# Patient Record
Sex: Female | Born: 1935 | Race: White | Hispanic: No | Smoking: Never smoker
Health system: Southern US, Community
[De-identification: ages and names within clinical notes are randomized; demographics above are authoritative.]

## PROBLEM LIST (undated history)

## (undated) DIAGNOSIS — M1990 Unspecified osteoarthritis, unspecified site: Secondary | ICD-10-CM

## (undated) DIAGNOSIS — K635 Polyp of colon: Secondary | ICD-10-CM

## (undated) DIAGNOSIS — I776 Arteritis, unspecified: Secondary | ICD-10-CM

## (undated) DIAGNOSIS — K649 Unspecified hemorrhoids: Secondary | ICD-10-CM

## (undated) DIAGNOSIS — K219 Gastro-esophageal reflux disease without esophagitis: Secondary | ICD-10-CM

## (undated) DIAGNOSIS — N189 Chronic kidney disease, unspecified: Secondary | ICD-10-CM

## (undated) DIAGNOSIS — I10 Essential (primary) hypertension: Secondary | ICD-10-CM

## (undated) DIAGNOSIS — C649 Malignant neoplasm of unspecified kidney, except renal pelvis: Secondary | ICD-10-CM

## (undated) DIAGNOSIS — N390 Urinary tract infection, site not specified: Secondary | ICD-10-CM

## (undated) DIAGNOSIS — E079 Disorder of thyroid, unspecified: Secondary | ICD-10-CM

## (undated) HISTORY — DX: Chronic kidney disease, unspecified: N18.9

## (undated) HISTORY — DX: Polyp of colon: K63.5

## (undated) HISTORY — DX: Unspecified osteoarthritis, unspecified site: M19.90

## (undated) HISTORY — DX: Gastro-esophageal reflux disease without esophagitis: K21.9

## (undated) HISTORY — PX: BILATERAL SALPINGOOPHORECTOMY: SHX1223

## (undated) HISTORY — DX: Disorder of thyroid, unspecified: E07.9

## (undated) HISTORY — DX: Unspecified hemorrhoids: K64.9

## (undated) HISTORY — DX: Arteritis, unspecified: I77.6

## (undated) HISTORY — DX: Essential (primary) hypertension: I10

## (undated) HISTORY — PX: ABDOMINAL HYSTERECTOMY: SHX81

## (undated) HISTORY — PX: HERNIA REPAIR: SHX51

## (undated) HISTORY — PX: COLONOSCOPY W/ BIOPSIES: SHX1374

## (undated) HISTORY — DX: Malignant neoplasm of unspecified kidney, except renal pelvis: C64.9

## (undated) HISTORY — DX: Urinary tract infection, site not specified: N39.0

## (undated) HISTORY — PX: FRACTURE SURGERY: SHX138

---
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
## 1998-08-04 HISTORY — PX: NOSE SURGERY: SHX723

## 2001-08-04 DIAGNOSIS — C649 Malignant neoplasm of unspecified kidney, except renal pelvis: Secondary | ICD-10-CM

## 2001-08-04 HISTORY — DX: Malignant neoplasm of unspecified kidney, except renal pelvis: C64.9

## 2004-05-21 ENCOUNTER — Ambulatory Visit: Payer: Self-pay

## 2004-05-30 ENCOUNTER — Ambulatory Visit: Payer: Self-pay | Admitting: General Surgery

## 2004-05-30 HISTORY — PX: UPPER GI ENDOSCOPY: SHX6162

## 2004-08-04 HISTORY — PX: INCONTINENCE SURGERY: SHX676

## 2007-02-02 ENCOUNTER — Ambulatory Visit: Payer: Self-pay | Admitting: Internal Medicine

## 2007-02-03 ENCOUNTER — Ambulatory Visit: Payer: Self-pay | Admitting: Internal Medicine

## 2008-06-03 ENCOUNTER — Emergency Department: Payer: Self-pay | Admitting: Emergency Medicine

## 2009-06-21 ENCOUNTER — Ambulatory Visit: Payer: Self-pay | Admitting: Family Medicine

## 2010-09-01 ENCOUNTER — Emergency Department: Payer: Self-pay | Admitting: Emergency Medicine

## 2011-04-02 ENCOUNTER — Ambulatory Visit: Payer: Self-pay | Admitting: Internal Medicine

## 2011-04-12 ENCOUNTER — Emergency Department: Payer: Self-pay | Admitting: *Deleted

## 2011-05-27 ENCOUNTER — Ambulatory Visit: Payer: Self-pay | Admitting: General Surgery

## 2011-05-30 LAB — PATHOLOGY REPORT

## 2011-06-16 ENCOUNTER — Ambulatory Visit: Payer: Self-pay | Admitting: Internal Medicine

## 2011-10-04 ENCOUNTER — Emergency Department: Payer: Self-pay | Admitting: Emergency Medicine

## 2011-10-04 LAB — BASIC METABOLIC PANEL
Anion Gap: 16 (ref 7–16)
BUN: 25 mg/dL — ABNORMAL HIGH (ref 7–18)
Calcium, Total: 8.8 mg/dL (ref 8.5–10.1)
Chloride: 107 mmol/L (ref 98–107)
Creatinine: 1.36 mg/dL — ABNORMAL HIGH (ref 0.60–1.30)
EGFR (African American): 48 — ABNORMAL LOW
EGFR (Non-African Amer.): 40 — ABNORMAL LOW
Glucose: 115 mg/dL — ABNORMAL HIGH (ref 65–99)
Osmolality: 296 (ref 275–301)
Potassium: 4.4 mmol/L (ref 3.5–5.1)
Sodium: 146 mmol/L — ABNORMAL HIGH (ref 136–145)

## 2011-10-04 LAB — CBC WITH DIFFERENTIAL/PLATELET
Basophil #: 0 10*3/uL (ref 0.0–0.1)
Eosinophil #: 0 10*3/uL (ref 0.0–0.7)
HCT: 37.8 % (ref 35.0–47.0)
HGB: 12.4 g/dL (ref 12.0–16.0)
Lymphocyte #: 1 10*3/uL (ref 1.0–3.6)
Lymphocyte %: 14.6 %
MCHC: 32.9 g/dL (ref 32.0–36.0)
Monocyte %: 6 %
Neutrophil %: 79.3 %
RDW: 14.3 % (ref 11.5–14.5)
WBC: 6.5 10*3/uL (ref 3.6–11.0)

## 2011-10-04 LAB — TROPONIN I: Troponin-I: <0.02 ng/mL

## 2012-01-20 ENCOUNTER — Ambulatory Visit: Payer: Self-pay | Admitting: Physical Medicine and Rehabilitation

## 2012-04-03 ENCOUNTER — Inpatient Hospital Stay: Payer: Self-pay | Admitting: Specialist

## 2012-04-03 LAB — CBC
HCT: 30 % — ABNORMAL LOW (ref 35.0–47.0)
MCH: 31.8 pg (ref 26.0–34.0)
MCHC: 34.1 g/dL (ref 32.0–36.0)
RDW: 13.8 % (ref 11.5–14.5)

## 2012-04-03 LAB — COMPREHENSIVE METABOLIC PANEL
Albumin: 3 g/dL — ABNORMAL LOW (ref 3.4–5.0)
Anion Gap: 9 (ref 7–16)
BUN: 29 mg/dL — ABNORMAL HIGH (ref 7–18)
Co2: 25 mmol/L (ref 21–32)
Creatinine: 1.76 mg/dL — ABNORMAL HIGH (ref 0.60–1.30)
Glucose: 121 mg/dL — ABNORMAL HIGH (ref 65–99)
Osmolality: 286 (ref 275–301)
Potassium: 3.8 mmol/L (ref 3.5–5.1)
Sodium: 140 mmol/L (ref 136–145)
Total Protein: 6.2 g/dL — ABNORMAL LOW (ref 6.4–8.2)

## 2012-04-03 LAB — CK TOTAL AND CKMB (NOT AT ARMC): CK-MB: <0.5 ng/mL — ABNORMAL LOW (ref 0.5–3.6)

## 2012-04-03 LAB — URINALYSIS, COMPLETE
Bilirubin,UR: NEGATIVE
Blood: NEGATIVE
Specific Gravity: 1.011 (ref 1.003–1.030)
Squamous Epithelial: NONE SEEN

## 2012-04-03 LAB — TROPONIN I: Troponin-I: <0.02 ng/mL

## 2012-04-04 LAB — CBC WITH DIFFERENTIAL/PLATELET
Basophil #: 0 10*3/uL (ref 0.0–0.1)
Eosinophil %: 0 %
Lymphocyte #: 0.2 10*3/uL — ABNORMAL LOW (ref 1.0–3.6)
MCHC: 33.8 g/dL (ref 32.0–36.0)
MCV: 93 fL (ref 80–100)
Monocyte %: 4.8 %
Neutrophil %: 92.4 %
Platelet: 134 10*3/uL — ABNORMAL LOW (ref 150–440)
RDW: 13.8 % (ref 11.5–14.5)

## 2012-04-04 LAB — BASIC METABOLIC PANEL
Anion Gap: 10 (ref 7–16)
BUN: 24 mg/dL — ABNORMAL HIGH (ref 7–18)
Co2: 23 mmol/L (ref 21–32)
Creatinine: 1.36 mg/dL — ABNORMAL HIGH (ref 0.60–1.30)
EGFR (African American): 42 — ABNORMAL LOW
EGFR (Non-African Amer.): 36 — ABNORMAL LOW
Osmolality: 280 (ref 275–301)
Potassium: 4.3 mmol/L (ref 3.5–5.1)

## 2012-04-05 LAB — BASIC METABOLIC PANEL
Anion Gap: 4 — ABNORMAL LOW (ref 7–16)
BUN: 23 mg/dL — ABNORMAL HIGH (ref 7–18)
Calcium, Total: 8 mg/dL — ABNORMAL LOW (ref 8.5–10.1)
EGFR (African American): 45 — ABNORMAL LOW
EGFR (Non-African Amer.): 39 — ABNORMAL LOW
Glucose: 86 mg/dL (ref 65–99)
Osmolality: 282 (ref 275–301)
Potassium: 4.1 mmol/L (ref 3.5–5.1)

## 2012-04-08 ENCOUNTER — Ambulatory Visit: Payer: Self-pay | Admitting: Internal Medicine

## 2012-04-09 ENCOUNTER — Encounter: Payer: Self-pay | Admitting: Internal Medicine

## 2012-05-04 ENCOUNTER — Encounter: Payer: Self-pay | Admitting: Internal Medicine

## 2012-10-11 ENCOUNTER — Ambulatory Visit: Payer: Self-pay | Admitting: Internal Medicine

## 2013-08-09 ENCOUNTER — Ambulatory Visit (INDEPENDENT_AMBULATORY_CARE_PROVIDER_SITE_OTHER): Payer: Medicare Other | Admitting: Adult Health

## 2013-08-09 ENCOUNTER — Encounter: Payer: Self-pay | Admitting: Adult Health

## 2013-08-09 VITALS — BP 122/74 | HR 73 | Temp 97.8°F | Resp 12 | Ht 62.5 in | Wt 128.8 lb

## 2013-08-09 DIAGNOSIS — Z7189 Other specified counseling: Secondary | ICD-10-CM

## 2013-08-09 DIAGNOSIS — Z7689 Persons encountering health services in other specified circumstances: Secondary | ICD-10-CM

## 2013-08-09 DIAGNOSIS — E875 Hyperkalemia: Secondary | ICD-10-CM | POA: Insufficient documentation

## 2013-08-09 DIAGNOSIS — N189 Chronic kidney disease, unspecified: Secondary | ICD-10-CM | POA: Insufficient documentation

## 2013-08-09 NOTE — Assessment & Plan Note (Signed)
Patient reports taking potassium supplements twice a day secondary to hypokalemia. Noticed elevated potassium level on lab work drawn in November 2014. 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 Asked patient to hold potassium supplement until we can confirm that her levels are not elevated. Patient with chronic kidney disease.

## 2013-08-09 NOTE — Patient Instructions (Signed)
   Thank you for choosing [place] [facility] for your health care needs.  Please have your labs faxed to my office when you have them done at [facility].  Your potassium level was slightly elevated on the labs you had drawn in November.  Hold your potassium supplements until you have this checked.  Please feel free to call with any questions or concerns.  Below is information on how to activate your MyChart account.

## 2013-08-09 NOTE — Progress Notes (Signed)
Pre visit review using our clinic review tool, if applicable. No additional management support is needed unless otherwise documented below in the visit note. 

## 2013-08-09 NOTE — Progress Notes (Signed)
Subjective:    Patient ID: [name], female    DOB: June 04, 1929, 80 y.o.   MRN: [id number]  HPI  Pt is a pleasant 80 y/o female who presents to establish care. She is followed at [facility] for CKD and vasculitis. Pt was recently followed by Dr. Arline Asp. We will request records.  Reports that she has a history of low potassium. Noticed that labs drawn at [facility] in November show slightly elevated potassium. She is on K supplements bid. She will be having blood work done at [facility]. Pt does not recall when she last had a Medicare Wellness exam. She will schedule this at her earliest convenience.    Past Medical History  Diagnosis Date  . Arthritis   . Renal cell carcinoma 2003    s/p sgy for removal   . GERD (gastroesophageal reflux disease)   . Hypertension   . Chronic kidney disease   . Colon polyp   . UTI (lower urinary tract infection)   . Thyroid disease     hyperthyroid, had radioactiveiodine treatment   . Vasculitis      Past Surgical History  Procedure Laterality Date  . Cholecystectomy    . Abdominal hysterectomy    . Bilateral salpingoophorectomy    . Hernia repair    . Fracture surgery Right     femur fracture     Family History  Problem Relation Age of Onset  . Arthritis Mother   . Heart disease Mother   . Heart disease Father   . Heart disease Sister     CAD  . Hypertension Sister      History   Social History  . Marital Status: Widowed    Spouse Name: N/A    Number of Children: 4  . Years of Education: 11   Occupational History  . Administrative - Office Work     Retired. Supervisor for [employer] History Main Topics  . Smoking status: Never Smoker   . Smokeless tobacco: Never Used  . Alcohol Use: No  . Drug Use: No  . Sexual Activity: Not on file   Other Topics Concern  . Not on file   Social History Narrative   [name] grew up in [place], [place]. She widowed in 1995. She has 4 adult children (3 daughters and 1 son).  She is very active in her church. She also does work for the [employer].       Review of Systems  Constitutional: Positive for fatigue.       S/p rituxan at [place]: Negative.   Eyes: Negative.   Respiratory: Negative.   Cardiovascular: Positive for leg swelling.  Gastrointestinal: Negative.   Endocrine: Negative.   Genitourinary: Negative.   Musculoskeletal: Negative.   Skin: Negative.   Allergic/Immunologic: Negative.   Neurological: Negative.   Hematological: Negative.   Psychiatric/Behavioral: Negative.        Objective:   Physical Exam  Constitutional: She is oriented to person, place, and time. She appears well-developed and well-nourished. No distress.  HENT:  Head: Normocephalic and atraumatic.  Eyes: Conjunctivae and EOM are normal. Pupils are equal, round, and reactive to light.  Neck: Normal range of motion. Neck supple. No tracheal deviation present.  Cardiovascular: Normal rate, regular rhythm, normal heart sounds and intact distal pulses.  Exam reveals no gallop and no friction rub.   No murmur heard. Pulmonary/Chest: Effort normal and breath sounds normal. No respiratory distress. She has no  wheezes. She has no rales.  Abdominal: Soft. Bowel sounds are normal.  Musculoskeletal: Normal range of motion. She exhibits edema. She exhibits no tenderness.  Bilateral LE trace edema  Lymphadenopathy:    She has no cervical adenopathy.  Neurological: She is alert and oriented to person, place, and time. She has normal reflexes. Coordination normal.  Skin: Skin is warm and dry.  Psychiatric: She has a normal mood and affect. Her behavior is normal. Judgment and thought content normal.    BP 122/74  Pulse 73  Temp(Src) 97.8 F (36.6 C) (Oral)  Resp 12  Ht 5' 2.5" (1.588 m)  Wt 128 lb 12 oz (58.401 kg)  BMI 23.16 kg/m2  SpO2 97%       Assessment & Plan:

## 2013-08-09 NOTE — Assessment & Plan Note (Signed)
Patient is followed at [facility].

## 2013-08-09 NOTE — Assessment & Plan Note (Signed)
Visit to establish care. History and physical reviewed with patient as well as current medications. She does not recall when her last Medicare wellness exam was. She will schedule this at her earliest convenience.

## 2013-09-01 ENCOUNTER — Encounter: Payer: Self-pay | Admitting: Adult Health

## 2013-09-01 ENCOUNTER — Ambulatory Visit (INDEPENDENT_AMBULATORY_CARE_PROVIDER_SITE_OTHER): Payer: Medicare Other | Admitting: Adult Health

## 2013-09-01 VITALS — BP 128/66 | HR 62 | Temp 98.1°F | Resp 12 | Ht 62.5 in | Wt 129.0 lb

## 2013-09-01 DIAGNOSIS — R0989 Other specified symptoms and signs involving the circulatory and respiratory systems: Secondary | ICD-10-CM

## 2013-09-01 DIAGNOSIS — Z0000 Encounter for general adult medical examination without abnormal findings: Secondary | ICD-10-CM | POA: Insufficient documentation

## 2013-09-01 DIAGNOSIS — Z1239 Encounter for other screening for malignant neoplasm of breast: Secondary | ICD-10-CM | POA: Insufficient documentation

## 2013-09-01 NOTE — Assessment & Plan Note (Signed)
Absent pedal pulse on the right encountered during physical exam. Coolness of right foot. No pain reported. Some discoloration of LE noted. Refer to Vascular Surgery for evaluation and further recommendations.

## 2013-09-01 NOTE — Assessment & Plan Note (Signed)
Mammogram ordered. Pt will self schedule.

## 2013-09-01 NOTE — Progress Notes (Signed)
Pre visit review using our clinic review tool, if applicable. No additional management support is needed unless otherwise documented below in the visit note. 

## 2013-09-01 NOTE — Patient Instructions (Addendum)
  Today we did your Medicare Wellness Exam.  I am referring you to a Vascular Surgeon to evaluate your pulse on the right foot. I could not feel one.  I have given you an order for your yearly mammogram. Please call the [facility] and schedule your appointment with them. Let them know that you have the doctor's order.  I would like to see you for follow up after you have seen the Vascular Surgeon. Please schedule that appointment accordingly.  Please call with any questions or concerns.

## 2013-09-01 NOTE — Assessment & Plan Note (Signed)
Exam was normal except for inability to palpate right pedal pulse and coolness of right foot. Breast exam also done and normal. She will have a mammogram done. Defer pelvic exam given age and no symptoms reported. Refer to Vascular Surgery for evaluation of right foot.

## 2013-09-01 NOTE — Progress Notes (Signed)
Subjective:    Patient ID: [name], female    DOB: 03-24-1929, 80 y.o.   MRN: [id number]  HPI  The patient is here for annual Medicare wellness examination.   The risk factors are reflected in the social history.  The roster of all physicians providing medical care to patient is listed in the Snapshot section of the chart.  Activities of daily living:  The patient is 100% independent in all ADLs: dressing, toileting, bathing, feeding as well as independent mobility.  Instrumental Activities of daily living: The patient is 100% independent in all iADLs: cooking, driving, keeping track of finances, managing medications, shopping, using telephone and computer.  Home safety: The patient has smoke detectors in the home. Seatbelts are worn 100%.  There are no firearms at home. There is no violence in the home. No hx of IPV.  There is no risks for hepatitis, STDs or HIV. There is no history of blood transfusion. No travel history to infectious disease endemic areas of the world.  The patient has recently seen dentist in the last six month for new dentures. Pt has seen eye doctor in the last week. No hearing impairment. They have deferred audiologic testing in the last year.  No excessive sun exposure. Discussed the need for sun protection: hats, long sleeves and use of sunscreen if there is significant sun exposure.   Diet: the importance of a healthy diet is discussed. Pt follows a healthy diet.  The benefits of regular aerobic exercise were discussed.   Depression screen: there are no signs or vegative symptoms of depression- irritability, change in appetite, anhedonia, sadness/tearfullness.  Cognitive assessment: the patient manages all their financial and personal affairs and is actively engaged. Able to relate day,date,year and events; recalled 2/3 objects at 3 minutes; performed clock-face test normally.  The following portions of the patient's history were reviewed and  updated as appropriate: allergies, current medications, past family history, past medical history,  past surgical history, past social history  and problem list.  Visual acuity was not assessed per patient preference since has regular follow up with ophthalmologist. Hearing and body mass index were assessed and reviewed.   During the course of the visit the patient was educated and counseled about appropriate screening and preventive services including : fall prevention , diabetes screening, nutrition counseling, colorectal cancer screening, and recommended immunizations.      Past Medical History  Diagnosis Date  . Arthritis   . GERD (gastroesophageal reflux disease)   . Hypertension   . Chronic kidney disease   . Colon polyp   . UTI (lower urinary tract infection)   . Vasculitis   . Thyroid disease 1957    hyperthyroid, had radioactiveiodine treatment   . Renal cell carcinoma 2003    s/p sgy for removal right kidney     Past Surgical History  Procedure Laterality Date  . Abdominal hysterectomy    . Bilateral salpingoophorectomy    . Hernia repair    . Fracture surgery Right     femur fracture  . Cholecystectomy  1957  . Nose surgery  2000  . Incontinence surgery  2006  . Cardiac catheterization  2008     Family History  Problem Relation Age of Onset  . Arthritis Mother   . Heart disease Mother   . Heart disease Father   . Heart disease Sister     CAD  . Hypertension Sister      History   Social History  .  Marital Status: Widowed    Spouse Name: N/A    Number of Children: 4  . Years of Education: 11   Occupational History  . 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 Smoking status: Never Smoker   . Smokeless tobacco: Never Used  . Alcohol Use: No  . Drug Use: No  . Sexual Activity: Not on file   Other Topics Concern  . Not on file   Social History Narrative   Ms. Seabury grew up in  [place], [place]. She widowed in 1995. She has 4 adult children (3 daughters and 1 son). She is very active in her church. She also does work for the [employer].      Review of Systems  Constitutional: Negative.   HENT: Negative.   Eyes: Negative.   Respiratory: Negative.   Cardiovascular: Positive for leg swelling.       Swelling improved since using compression stockings.  Gastrointestinal: Negative.   Endocrine: Negative.   Genitourinary: Negative.   Musculoskeletal: Negative.   Skin: Negative.   Allergic/Immunologic: Negative.   Neurological: Negative.   Hematological: Negative.   Psychiatric/Behavioral: Negative.        Objective:   Physical Exam  Constitutional: She is oriented to person, place, and time. She appears well-developed and well-nourished. No distress.  HENT:  Head: Normocephalic and atraumatic.  Right Ear: External ear normal.  Left Ear: External ear normal.  Nose: Nose normal.  Mouth/Throat: Oropharynx is clear and moist.  Eyes: Conjunctivae and EOM are normal. Pupils are equal, round, and reactive to light.  Neck: Normal range of motion. Neck supple. No tracheal deviation present. No thyromegaly present.  Cardiovascular: Normal rate, regular rhythm and normal heart sounds.  Exam reveals no gallop and no friction rub.   No murmur heard. Right pedal pulse not palpable. Cool foot  Pulmonary/Chest: Effort normal and breath sounds normal. No respiratory distress. She has no wheezes. She has no rales. Right breast exhibits no inverted nipple, no mass, no nipple discharge, no skin change and no tenderness. Left breast exhibits no inverted nipple, no mass, no nipple discharge and no skin change. Breasts are symmetrical.  Abdominal: Soft. Bowel sounds are normal. She exhibits no distension and no mass. There is no tenderness. There is no rebound and no guarding.  Musculoskeletal: Normal range of motion. She exhibits no edema and no tenderness.  kyphosis    Lymphadenopathy:    She has no cervical adenopathy.  Neurological: She is alert and oriented to person, place, and time. She has normal reflexes. No cranial nerve deficit. Coordination normal.  Skin: Skin is warm and dry.     Psychiatric: She has a normal mood and affect. Her behavior is normal. Judgment and thought content normal.    BP 128/66  Pulse 62  Temp(Src) 98.1 F (36.7 C) (Oral)  Resp 12  Ht 5' 2.5" (1.588 m)  Wt 129 lb (58.514 kg)  BMI 23.20 kg/m2  SpO2 94%     Assessment & Plan:

## 2013-09-09 ENCOUNTER — Encounter (INDEPENDENT_AMBULATORY_CARE_PROVIDER_SITE_OTHER): Payer: Self-pay

## 2013-09-09 ENCOUNTER — Ambulatory Visit (INDEPENDENT_AMBULATORY_CARE_PROVIDER_SITE_OTHER): Payer: Medicare Other | Admitting: Adult Health

## 2013-09-09 ENCOUNTER — Encounter: Payer: Self-pay | Admitting: Adult Health

## 2013-09-09 VITALS — BP 122/68 | HR 60 | Resp 12 | Wt 129.0 lb

## 2013-09-09 DIAGNOSIS — R0989 Other specified symptoms and signs involving the circulatory and respiratory systems: Secondary | ICD-10-CM

## 2013-09-09 NOTE — Progress Notes (Signed)
Pre visit review using our clinic review tool, if applicable. No additional management support is needed unless otherwise documented below in the visit note. 

## 2013-09-09 NOTE — Progress Notes (Signed)
Patient ID: [name], female   DOB: 18-Sep-1928, 80 y.o.   MRN: [id number]    Subjective:    Patient ID: [name], female    DOB: 11-Jul-1929, 80 y.o.   MRN: [id number]  HPI  [name] is a pleasant 80 y/o female who presents to clinic for f/u after seeing vascular surgeon for absent pedal pulses during her Medicare Wellness Exam. She was also experiencing some bilateral LE swelling and coolness of feet. She reports that exam was normal. He did find her pulses and she reports he told her they were 95%. She is wearing her compression socks that at mid leg. Swelling has improved since wearing these. She looks good. Denies any new concerns.   Past Medical History  Diagnosis Date  . Arthritis   . GERD (gastroesophageal reflux disease)   . Hypertension   . Chronic kidney disease   . Colon polyp   . UTI (lower urinary tract infection)   . Vasculitis   . Thyroid disease 1957    hyperthyroid, had radioactiveiodine treatment   . Renal cell carcinoma 2003    s/p sgy for removal right kidney    Current Outpatient Prescriptions on File Prior to Visit  Medication Sig Dispense Refill  . aspirin 81 MG tablet Take 81 mg by mouth daily.      . cholecalciferol (VITAMIN D) 1000 UNITS tablet Take 1,000 Units by mouth daily.      Marland Kitchen CRANBERRY PO Take 84 mg by mouth daily.      . cyanocobalamin 500 MCG tablet Take 500 mcg by mouth daily.      . ferrous sulfate 325 (65 FE) MG tablet Take by mouth daily with breakfast. 1-2 tabs daily      . lisinopril-hydrochlorothiazide (PRINZIDE,ZESTORETIC) 10-12.5 MG per tablet Take 1 tablet by mouth daily.      . Multiple Vitamins-Minerals (CENTRUM SILVER PO) Take by mouth.      . Multiple Vitamins-Minerals (IMMUNE SYSTEM BOOSTER PO) Take by mouth. Ambatrose (Immune System) 3 tabs daily      . Multiple Vitamins-Minerals (PRESERVISION AREDS PO) Take 2 tablets by mouth.      . Omega-3 Fatty Acids (FISH OIL) 600 MG CAPS Take 1 capsule by mouth daily.      Marland Kitchen  OVER THE COUNTER MEDICATION Take 3 tablets by mouth daily. Real coral calcium      . predniSONE (DELTASONE) 5 MG tablet Take 5 mg by mouth daily.      . Probiotic Product (PROBIOTIC DAILY PO) Take 1 tablet by mouth daily.      . Turmeric 500 MG CAPS Take 2 capsules by mouth daily.      . vitamin C (ASCORBIC ACID) 500 MG tablet Take 500 mg by mouth daily.      . vitamin E 400 UNIT capsule Take 400 Units by mouth daily.       No current facility-administered medications on file prior to visit.   Review of Systems  Respiratory: Negative.   Cardiovascular: Negative.  Negative for leg swelling.  Genitourinary: Negative.   Neurological: Negative.   All other systems reviewed and are negative.       Objective:  BP 122/68  Pulse 60  Resp 12  Wt 129 lb (58.514 kg)  SpO2 98%   Physical Exam  Constitutional: She is oriented to person, place, and time. She appears well-developed and well-nourished. No distress.  Cardiovascular: Normal rate, regular rhythm, normal heart sounds and intact distal pulses.  Exam reveals no gallop.   No murmur heard. Pulmonary/Chest: Effort normal. No respiratory distress.  Neurological: She is alert and oriented to person, place, and time.  Skin: Skin is warm and dry.  Psychiatric: She has a normal mood and affect. Her behavior is normal. Judgment and thought content normal.       Assessment & Plan:   1. Absent pedal pulses Pt was seen by vascular on Monday. Per patient report pulses are present and no problems identified. I am requesting the report and will append this note accordingly. Swelling improved with compression socks. I have advised her to try to find some that are knee hi.

## 2013-09-22 ENCOUNTER — Encounter: Payer: Self-pay | Admitting: Adult Health

## 2013-10-12 ENCOUNTER — Ambulatory Visit: Payer: Self-pay | Admitting: Adult Health

## 2013-11-11 ENCOUNTER — Encounter: Payer: Self-pay | Admitting: Adult Health

## 2013-11-30 ENCOUNTER — Telehealth: Payer: Self-pay | Admitting: *Deleted

## 2013-11-30 NOTE — Telephone Encounter (Signed)
Please call pt and tell her that it was probably some residue from the pill or capsule. This can happen if there is any coating on the pills or capsule.

## 2013-11-30 NOTE — Telephone Encounter (Signed)
Spoke with patient and notified her of Raquel's comments. Patient verbalized understanding.  

## 2013-11-30 NOTE — Telephone Encounter (Signed)
Patient walked in stating some white smoke came out of her mouth. She went to the [employer] and bought some hair, skin and nail vitamins by the name of Biosil. They are white capsules, she has taken only 1 pill. She took it yesterday then a few minutes later a long chain of white smoke came out of her mouth. She is concerned about this because she has never smoked a day in her life, this is the only time it happened and has not happened again. Please advice.

## 2013-12-19 ENCOUNTER — Telehealth: Payer: Self-pay | Admitting: Adult Health

## 2013-12-19 NOTE — Telephone Encounter (Signed)
Pt left vm asking R. Rey to return her call.  No further details left on vm.dms

## 2013-12-20 ENCOUNTER — Other Ambulatory Visit: Payer: Self-pay | Admitting: Adult Health

## 2013-12-20 ENCOUNTER — Telehealth: Payer: Self-pay | Admitting: Adult Health

## 2013-12-20 DIAGNOSIS — K921 Melena: Secondary | ICD-10-CM

## 2013-12-20 NOTE — Telephone Encounter (Signed)
Patient stopped by the office to speak with Raquel. Patient request for Raquel to call to Dr. Festus Aloe office to make a referral for a colonoscopy. Patient stated that she is having black stools.

## 2013-12-20 NOTE — Telephone Encounter (Signed)
Please call pt and let her know that I will be glad to place the referral. I would like her to come by the office so that we can give her a collection container to evaluate for blood in her stool first.

## 2013-12-20 NOTE — Telephone Encounter (Signed)
Patient Stated that she has black stools and would like a referral to see Dr.Byrnette to have a colonoscopy done. Please advise.

## 2013-12-21 NOTE — Telephone Encounter (Signed)
Pt notified and verbalized understanding.

## 2013-12-23 ENCOUNTER — Other Ambulatory Visit (INDEPENDENT_AMBULATORY_CARE_PROVIDER_SITE_OTHER): Payer: Medicare Other

## 2013-12-23 DIAGNOSIS — R195 Other fecal abnormalities: Secondary | ICD-10-CM

## 2013-12-23 DIAGNOSIS — K921 Melena: Secondary | ICD-10-CM

## 2013-12-23 LAB — FECAL OCCULT BLOOD, IMMUNOCHEMICAL: FECAL OCCULT BLD: POSITIVE — AB

## 2013-12-26 ENCOUNTER — Other Ambulatory Visit: Payer: Self-pay | Admitting: Adult Health

## 2013-12-26 DIAGNOSIS — R195 Other fecal abnormalities: Secondary | ICD-10-CM

## 2013-12-28 ENCOUNTER — Encounter: Payer: Self-pay | Admitting: *Deleted

## 2013-12-30 ENCOUNTER — Encounter: Payer: Self-pay | Admitting: General Surgery

## 2014-01-03 ENCOUNTER — Ambulatory Visit (INDEPENDENT_AMBULATORY_CARE_PROVIDER_SITE_OTHER): Payer: Medicare Other | Admitting: General Surgery

## 2014-01-03 ENCOUNTER — Encounter: Payer: Self-pay | Admitting: General Surgery

## 2014-01-03 ENCOUNTER — Other Ambulatory Visit: Payer: Self-pay | Admitting: General Surgery

## 2014-01-03 VITALS — BP 98/60 | HR 74 | Resp 12 | Ht 60.0 in | Wt 131.0 lb

## 2014-01-03 DIAGNOSIS — Z860100 Personal history of colon polyps, unspecified: Secondary | ICD-10-CM | POA: Insufficient documentation

## 2014-01-03 DIAGNOSIS — K625 Hemorrhage of anus and rectum: Secondary | ICD-10-CM | POA: Insufficient documentation

## 2014-01-03 LAB — HEMOCCULT GUIAC POC 1CARD (OFFICE): Fecal Occult Blood, POC: NEGATIVE

## 2014-01-03 MED ORDER — POLYETHYLENE GLYCOL 3350 17 GM/SCOOP PO POWD
1.0000 | Freq: Once | ORAL | Status: DC
Start: 2014-01-03 — End: 2015-01-24

## 2014-01-03 NOTE — Progress Notes (Signed)
Patient ID: [name], female   DOB: [birth year], 80 y.o.   MRN: [id number]  Chief Complaint  Patient presents with  . Other    blood in stool    HPI [name] is a 80 y.o. female who presents for an evaluation of rectal bleeding. The patient states she has had rectal bleeding for approximately 2 years. She has a history of hemorrhoids. In the last couple of months she has had a significant amount of rectal bleeding. It is noted in the toilet bowl. The bleeding is bright red in color. Her last colonoscopy was 05/27/2011. She states he bowel movements have gotten black in color which started approximately 1 month ago when her iron supplements were increased from 2 times per day to 3 times per day. She also complains of constipation that has gotten worse in the last year.    HPI  Past Medical History  Diagnosis Date  . Arthritis   . GERD (gastroesophageal reflux disease)   . Hypertension   . Chronic kidney disease   . Colon polyp   . UTI (lower urinary tract infection)   . Vasculitis     ANCA positive, pulmonary hemorrhage  . Thyroid disease 1957    hyperthyroid, had radioactiveiodine treatment   . Renal cell carcinoma 2003    s/p sgy for removal right kidney    Past Surgical History  Procedure Laterality Date  . Abdominal hysterectomy    . Bilateral salpingoophorectomy    . Hernia repair    . Fracture surgery Right     femur fracture  . Cholecystectomy  1957  . Nose surgery  2000  . Incontinence surgery  2006  . Cardiac catheterization  2008  . Colonoscopy w/ biopsies  May 27, 2011    tubular adenoma in the ascending colon and descending colon. Tubulovillous adenoma in the upper rectum 12 mm. Diverticulosis.  Marland Kitchen Upper gi endoscopy  May 30, 2004    large hiatal hernia, duodenal diverticulum.    Family History  Problem Relation Age of Onset  . Arthritis Mother   . Heart disease Mother   . Heart disease Father   . Heart disease Sister     CAD  .  Hypertension Sister     Social History History  Substance Use Topics  . Smoking status: Never Smoker   . Smokeless tobacco: Never Used  . Alcohol Use: No    Allergies  Allergen Reactions  . Amoxicillin Other (See Comments)    Hand tingling   . Nsaids     Current Outpatient Prescriptions  Medication Sig Dispense Refill  . aspirin 81 MG tablet Take 81 mg by mouth daily.      . cholecalciferol (VITAMIN D) 1000 UNITS tablet Take 1,000 Units by mouth daily.      Marland Kitchen CRANBERRY PO Take 84 mg by mouth daily.      . cyanocobalamin 500 MCG tablet Take 500 mcg by mouth daily.      . ferrous sulfate 325 (65 FE) MG tablet Take by mouth daily with breakfast. 1-2 tabs daily      . lisinopril-hydrochlorothiazide (PRINZIDE,ZESTORETIC) 10-12.5 MG per tablet Take 1 tablet by mouth daily.      . Multiple Vitamins-Minerals (CENTRUM SILVER PO) Take by mouth.      . Multiple Vitamins-Minerals (HAIR VITAMINS PO) Take by mouth.      . Multiple Vitamins-Minerals (IMMUNE SYSTEM BOOSTER PO) Take by mouth. Ambatrose (Immune System) 3 tabs daily      .  Multiple Vitamins-Minerals (PRESERVISION AREDS PO) Take 2 tablets by mouth.      . Omega-3 Fatty Acids (FISH OIL) 600 MG CAPS Take 1 capsule by mouth daily.      Marland Kitchen OVER THE COUNTER MEDICATION Take 3 tablets by mouth daily. Real coral calcium      . predniSONE (DELTASONE) 5 MG tablet Take 5 mg by mouth daily.      . Probiotic Product (PROBIOTIC DAILY PO) Take 1 tablet by mouth daily.      . Turmeric 500 MG CAPS Take 2 capsules by mouth daily.      . vitamin C (ASCORBIC ACID) 500 MG tablet Take 500 mg by mouth daily.      . vitamin E 400 UNIT capsule Take 400 Units by mouth daily.      . polyethylene glycol powder (GLYCOLAX/MIRALAX) powder Take 255 g (1 Container total) by mouth once.  255 g  0   No current facility-administered medications for this visit.    Review of Systems Review of Systems  Gastrointestinal: Positive for constipation and anal bleeding.     Blood pressure 98/60, pulse 74, resp. rate 12, height 5' (1.524 m), weight 131 lb (59.421 kg).  Physical Exam Physical Exam  Constitutional: She is oriented to person, place, and time. She appears well-developed and well-nourished.  Cardiovascular: Normal rate, regular rhythm and normal heart sounds.   No murmur heard. Pulmonary/Chest: Effort normal and breath sounds normal.  Abdominal: Soft. Normal appearance and bowel sounds are normal. There is no hepatosplenomegaly. There is no tenderness. A hernia is present. Hernia confirmed positive in the right inguinal area.  Genitourinary:  Left posterior lateral hemorrhoid with superficial ulceration. No active bleeding.  Digital exam showed normal sphincter tone. Minimal prolapsing tissue with vigorous straining. Normal relaxation with pressure. No palpable rectal masses. Anoscopy showed the lower rectal mucosa be unremarkable. Mild prominence of the vascular mucosa at the dentate line.  Neurological: She is alert and oriented to person, place, and time.  Skin: Skin is warm and dry.    Data Reviewed May 27, 2011 colonoscopy showed a tubulovillous adenoma in the upper rectum as well as tubular adenomas 10 mm in diameter the descending colon and a tubular adenoma 5 mm in diameter in the ascending colon.  Assessment    Right inguinal hernia, gradually increasing size.  Rectal bleeding, possible prolapsing rectal mucosa versus external hemorrhoids.  Past history of colonic polyps.     Plan    The patient reports that the anal hernias gradually increasing in size and has been more uncomfortable. This may be due to ongoing constipation from her iron therapy. With her past history of polyps in the be appropriate to go ahead and confirmed that no additional polyps were no new source of rectal bleeding is seen. The patient may benefit from hemorrhoidectomy and whether this would be an open hemorrhoidectomy or a stapled procedure is still  unclear in my mind at this time.     Patient is scheduled for a colonoscopy at [facility] on 01/10/14. She will stop her Fish Oil 1 week prior. She will only take her blood pressure pill with a small sip of water at 6 am the morning of. Patient is aware to pre register with the hospital at least 2 days prior. Patient is aware of date and instructions. Miralax prescription has been sent into her pharmacy.  PCP/ Ref MD: Marcie Bal, Raquel/Teresa Skip Mayer Keeley Sussman 01/03/2014, 8:16 PM

## 2014-01-03 NOTE — Patient Instructions (Addendum)
Patient to be scheduled for a colonoscopy.  Colonoscopy A colonoscopy is an exam to look at the entire large intestine (colon). This exam can help find problems such as tumors, polyps, inflammation, and areas of bleeding. The exam takes about 1 hour.  LET [facility] CARE PROVIDER KNOW ABOUT:   Any allergies you have.  All medicines you are taking, including vitamins, herbs, eye drops, creams, and over-the-counter medicines.  Previous problems you or members of your family have had with the use of anesthetics.  Any blood disorders you have.  Previous surgeries you have had.  Medical conditions you have. RISKS AND COMPLICATIONS  Generally, this is a safe procedure. However, as with any procedure, complications can occur. Possible complications include:  Bleeding.  Tearing or rupture of the colon wall.  Reaction to medicines given during the exam.  Infection (rare). BEFORE THE PROCEDURE   Ask your health care provider about changing or stopping your regular medicines.  You may be prescribed an oral bowel prep. This involves drinking a large amount of medicated liquid, starting the day before your procedure. The liquid will cause you to have multiple loose stools until your stool is almost clear or light green. This cleans out your colon in preparation for the procedure.  Do not eat or drink anything else once you have started the bowel prep, unless your health care provider tells you it is safe to do so.  Arrange for someone to drive you home after the procedure. PROCEDURE   You will be given medicine to help you relax (sedative).  You will lie on your side with your knees bent.  A long, flexible tube with a light and camera on the end (colonoscope) will be inserted through the rectum and into the colon. The camera sends video back to a computer screen as it moves through the colon. The colonoscope also releases carbon dioxide gas to inflate the colon. This helps your health  care provider see the area better.  During the exam, your health care provider may take a small tissue sample (biopsy) to be examined under a microscope if any abnormalities are found.  The exam is finished when the entire colon has been viewed. AFTER THE PROCEDURE   Do not drive for 24 hours after the exam.  You may have a small amount of blood in your stool.  You may pass moderate amounts of gas and have mild abdominal cramping or bloating. This is caused by the gas used to inflate your colon during the exam.  Ask when your test results will be ready and how you will get your results. Make sure you get your test results. Document Released: 07/18/2000 Document Revised: 05/11/2013 Document Reviewed: 03/28/2013 [facility] Patient Information 2014 [place].  Patient is scheduled for a colonoscopy at [facility] on 01/10/14. She will stop her Fish Oil 1 week prior. She will only take her blood pressure pill with a small sip of water at 6 am the morning of. Patient is aware to pre register with the hospital at least 2 days prior. Patient is aware of date and instructions. Miralax prescription has been sent into her pharmacy.

## 2014-01-09 ENCOUNTER — Encounter: Payer: Self-pay | Admitting: Adult Health

## 2014-01-09 ENCOUNTER — Ambulatory Visit (INDEPENDENT_AMBULATORY_CARE_PROVIDER_SITE_OTHER): Payer: Medicare Other | Admitting: Adult Health

## 2014-01-09 VITALS — BP 124/76 | HR 60 | Temp 98.1°F | Resp 14 | Ht 62.5 in | Wt 131.0 lb

## 2014-01-09 DIAGNOSIS — Z79899 Other long term (current) drug therapy: Secondary | ICD-10-CM

## 2014-01-09 DIAGNOSIS — E538 Deficiency of other specified B group vitamins: Secondary | ICD-10-CM

## 2014-01-09 LAB — VITAMIN B12: Vitamin B-12: >1500 pg/mL — ABNORMAL HIGH (ref 211–911)

## 2014-01-09 NOTE — Progress Notes (Signed)
Subjective:    Patient ID: [name], female    DOB: [birth year], 80 y.o.   MRN: [id number]  HPI Pt is a pleasant 80 y/o female with hx of chronic kidney disease, vasculitis followed at [facility], HTN who presents to clinic for medication management. She is on multiple over the counter supplements. She reports that her daughter is a Therapist, sports and has put her on multiple replacements to help with overall immune health. Some of these vitamins are overlapping. She did not bring the bottles with her so it is hard to say what each contains.  She has hx of B12 deficiency. She is taking some replacement of B12 but it is a lower dose. She would like to know if she should be on B12 injections. We discussed that B12 is obtained from our diet and occasionally people do not absorb this well. The only way to tell if she needs replacement via injection is to check her blood. Insurance does not usually cover injectable B12 unless she is showing deficiency.  She is scheduled to have a colonoscopy tomorrow. She will have this done by Dr. Bary Castilla. She has started the prep from the procedure. Pt had been experiencing some rectal bleeding. Hx of hemorrhoids and polyps.  Pt is feeling well today.   Past Medical History  Diagnosis Date  . Arthritis   . GERD (gastroesophageal reflux disease)   . Hypertension   . Chronic kidney disease   . Colon polyp   . UTI (lower urinary tract infection)   . Vasculitis     ANCA positive, pulmonary hemorrhage  . Thyroid disease 1957    hyperthyroid, had radioactiveiodine treatment   . Renal cell carcinoma 2003    s/p sgy for removal right kidney    Review of Systems  Constitutional: Negative.   HENT: Negative.   Eyes: Negative.   Respiratory: Negative.   Cardiovascular: Negative.   Gastrointestinal: Negative.   Endocrine: Negative.   Genitourinary: Negative.   Musculoskeletal: Negative.   Skin: Negative.   Allergic/Immunologic: Negative.   Neurological: Negative.     Hematological: Negative.   Psychiatric/Behavioral: Negative.        Objective:   Physical Exam  Constitutional: She is oriented to person, place, and time. No distress.  HENT:  Head: Normocephalic and atraumatic.  Eyes: Conjunctivae and EOM are normal.  Neck: Normal range of motion. Neck supple.  Cardiovascular: Normal rate, regular rhythm, normal heart sounds and intact distal pulses.  Exam reveals no gallop and no friction rub.   No murmur heard. Pulmonary/Chest: Effort normal and breath sounds normal. No respiratory distress. She has no wheezes. She has no rales.  Musculoskeletal: Normal range of motion.  Neurological: She is alert and oriented to person, place, and time. She has normal reflexes. Coordination normal.  Skin: Skin is warm and dry.  Psychiatric: She has a normal mood and affect. Her behavior is normal. Judgment and thought content normal.    BP 124/76  Pulse 60  Temp(Src) 98.1 F (36.7 C) (Oral)  Resp 14  Ht 5' 2.5" (1.588 m)  Wt 131 lb (59.421 kg)  BMI 23.56 kg/m2  SpO2 98%     Assessment & Plan:   1. B12 deficiency Discussion of about replacement via injection only if deficient. We will check her b12 levels which have not been checked this year. Continue to follow. - Vitamin B12  2. Medication management Pt is on multiple OTC supplements, some of which may overlap with the same  vitamins and minerals. She did not bring her bottles with her. Since these are OTC it is hard to determine what each one contains. I did ask her to stop her vitamin E since she is definitely getting this in her multiple vitamin - centrum silver. She will bring the bottles with her on the next visit to evaluate this and perhaps condense some of these supplements.

## 2014-01-09 NOTE — Patient Instructions (Signed)
  Please have your labs drawn before leaving.  Return for follow up in November. Bring your vitamin bottles with you so that we can review.

## 2014-01-09 NOTE — Progress Notes (Signed)
Pre visit review using our clinic review tool, if applicable. No additional management support is needed unless otherwise documented below in the visit note. 

## 2014-01-10 ENCOUNTER — Ambulatory Visit: Payer: Self-pay | Admitting: General Surgery

## 2014-01-10 DIAGNOSIS — D128 Benign neoplasm of rectum: Secondary | ICD-10-CM

## 2014-01-10 DIAGNOSIS — D129 Benign neoplasm of anus and anal canal: Secondary | ICD-10-CM

## 2014-01-10 LAB — HM COLONOSCOPY

## 2014-01-13 ENCOUNTER — Telehealth: Payer: Self-pay

## 2014-01-13 LAB — PATHOLOGY REPORT

## 2014-01-13 NOTE — Telephone Encounter (Signed)
Message copied by Lesly Rubenstein on Fri Jan 13, 2014  8:48 AM ------      Message from: Robert Bellow      Created: Fri Jan 13, 2014  8:27 AM       Please notify the patient all polyps removed were cancer free.  F/U as scheduled later in the month to discuss. Thanks. ------

## 2014-01-13 NOTE — Telephone Encounter (Signed)
Notified patient as instructed, patient pleased. Discussed follow-up appointments, patient agrees  

## 2014-01-16 LAB — HM COLONOSCOPY

## 2014-01-17 ENCOUNTER — Encounter: Payer: Self-pay | Admitting: General Surgery

## 2014-01-25 ENCOUNTER — Ambulatory Visit (INDEPENDENT_AMBULATORY_CARE_PROVIDER_SITE_OTHER): Payer: Medicare Other | Admitting: General Surgery

## 2014-01-25 ENCOUNTER — Encounter: Payer: Self-pay | Admitting: General Surgery

## 2014-01-25 VITALS — BP 124/66 | HR 70 | Resp 14 | Ht 62.0 in | Wt 131.0 lb

## 2014-01-25 DIAGNOSIS — Z86010 Personal history of colonic polyps: Secondary | ICD-10-CM

## 2014-01-25 NOTE — Patient Instructions (Signed)
Patient to return in three years colonoscopy.

## 2014-01-25 NOTE — Progress Notes (Signed)
Patient ID: [name], female   DOB: [birth year], 80 y.o.   MRN: [id number]  Chief Complaint  Patient presents with  . Routine Post Op    colonoscopy    HPI [name] is a 80 y.o. female here today for her post op colonoscopy done on 01/10/14.Patient states she is doing well. She had an episodes  of bleeding last night.She thinks it was due to constipation. HPI  Past Medical History  Diagnosis Date  . Arthritis   . GERD (gastroesophageal reflux disease)   . Hypertension   . Chronic kidney disease   . Colon polyp   . UTI (lower urinary tract infection)   . Vasculitis     ANCA positive, pulmonary hemorrhage  . Thyroid disease 1957    hyperthyroid, had radioactiveiodine treatment   . Renal cell carcinoma 2003    s/p sgy for removal right kidney  . Hemorrhoids     Past Surgical History  Procedure Laterality Date  . Abdominal hysterectomy    . Bilateral salpingoophorectomy    . Hernia repair    . Fracture surgery Right     femur fracture  . Cholecystectomy  1957  . Nose surgery  2000  . Incontinence surgery  2006  . Cardiac catheterization  2008  . Colonoscopy w/ biopsies  May 27, 2011,01/10/14    tubular adenoma in the ascending colon and descending colon. Tubulovillous adenoma in the upper rectum 12 mm. Diverticulosis.  Marland Kitchen Upper gi endoscopy  May 30, 2004    large hiatal hernia, duodenal diverticulum.    Family History  Problem Relation Age of Onset  . Arthritis Mother   . Heart disease Mother   . Heart disease Father   . Heart disease Sister     CAD  . Hypertension Sister     Social History History  Substance Use Topics  . Smoking status: Never Smoker   . Smokeless tobacco: Never Used  . Alcohol Use: No    Allergies  Allergen Reactions  . Amoxicillin Other (See Comments)    Hand tingling   . Nsaids     Current Outpatient Prescriptions  Medication Sig Dispense Refill  . aspirin 81 MG tablet Take 81 mg by mouth daily.      .  cholecalciferol (VITAMIN D) 1000 UNITS tablet Take 1,000 Units by mouth daily.      Marland Kitchen CRANBERRY PO Take 84 mg by mouth daily.      . cyanocobalamin 500 MCG tablet Take 500 mcg by mouth daily.      . ferrous sulfate 325 (65 FE) MG EC tablet Take 325 mg by mouth.      . ferrous sulfate 325 (65 FE) MG tablet Take by mouth 3 (three) times daily with meals.       Marland Kitchen lisinopril-hydrochlorothiazide (PRINZIDE,ZESTORETIC) 10-12.5 MG per tablet Take 1 tablet by mouth daily.      . Multiple Vitamins-Minerals (CENTRUM SILVER PO) Take by mouth.      . Multiple Vitamins-Minerals (HAIR VITAMINS PO) Take by mouth.      . Multiple Vitamins-Minerals (IMMUNE SYSTEM BOOSTER PO) Take by mouth. Ambatrose (Immune System) 3 tabs daily      . Omega-3 Fatty Acids (FISH OIL) 600 MG CAPS Take 1 capsule by mouth daily.      Marland Kitchen OVER THE COUNTER MEDICATION Take 3 tablets by mouth daily. Real coral calcium      . polyethylene glycol powder (GLYCOLAX/MIRALAX) powder Take 255 g (1 Container total) by  mouth once.  255 g  0  . polyethylene glycol powder (GLYCOLAX/MIRALAX) powder Take by mouth.      . predniSONE (DELTASONE) 5 MG tablet Take 5 mg by mouth daily.      . Probiotic Product (PROBIOTIC DAILY PO) Take 1 tablet by mouth daily.      . Turmeric 500 MG CAPS Take 2 capsules by mouth daily.      . vitamin C (ASCORBIC ACID) 500 MG tablet Take 500 mg by mouth daily.      . Wheat Dextrin (BENEFIBER DRINK MIX) PACK Take by mouth.       No current facility-administered medications for this visit.    Review of Systems Review of Systems  Constitutional: Negative.   Respiratory: Negative.   Cardiovascular: Negative.   Gastrointestinal: Positive for constipation.    Blood pressure 124/66, pulse 70, resp. rate 14, height 5\' 2"  (1.575 m), weight 131 lb (59.421 kg).  Physical Exam Physical Exam  Constitutional: She is oriented to person, place, and time. She appears well-developed and well-nourished.  Neurological: She is alert  and oriented to person, place, and time.  Skin: Skin is warm and dry.    Data Reviewed Colonoscopy dated 01/11/2014 showed a tubular adenoma in the cecum and distal transverse colon. A tubulovillous adenoma without dysplasia or malignancy was identified in the descending colon.  Assessment    Colonic polyps.  Rectal bleeding likely secondary to hemorrhoids.    Plan    At this time the patient reports her rectal bleeding is significantly improved over that prior to colonoscopy. We always have hemorrhoidectomy to fall back on (likely a stapled procedure based on her anatomy. She'll call the bleeding becomes more pronounced or persistent. Will otherwise plan for a followup colonoscopy in 3 years of her functional status remains as good as today.    PCP: Meridee Score 01/27/2014, 4:01 PM

## 2014-01-29 ENCOUNTER — Encounter: Payer: Self-pay | Admitting: Adult Health

## 2014-09-15 ENCOUNTER — Encounter: Payer: Self-pay | Admitting: Nurse Practitioner

## 2014-09-15 ENCOUNTER — Ambulatory Visit (INDEPENDENT_AMBULATORY_CARE_PROVIDER_SITE_OTHER): Payer: Medicare Other | Admitting: Nurse Practitioner

## 2014-09-15 VITALS — BP 130/86 | HR 84 | Temp 97.6°F | Resp 14 | Ht 62.0 in | Wt 131.4 lb

## 2014-09-15 DIAGNOSIS — R319 Hematuria, unspecified: Secondary | ICD-10-CM

## 2014-09-15 DIAGNOSIS — Z85528 Personal history of other malignant neoplasm of kidney: Secondary | ICD-10-CM

## 2014-09-15 DIAGNOSIS — R002 Palpitations: Secondary | ICD-10-CM

## 2014-09-15 LAB — POCT URINALYSIS DIPSTICK
BILIRUBIN UA: NEGATIVE
Blood, UA: NEGATIVE
Glucose, UA: NEGATIVE
Ketones, UA: NEGATIVE
Nitrite, UA: NEGATIVE
Protein, UA: NEGATIVE
Spec Grav, UA: 1.01
Urobilinogen, UA: 0.2
pH, UA: 6.5

## 2014-09-15 NOTE — Progress Notes (Signed)
Subjective:    Patient ID: [name], female    DOB: [birth year], 81 y.o.   MRN: [id number]  HPI  [name] is a 81 yo female with a CC of UTI and history of kidney cancer.   1) Finished Cipro- 10 days  Dr. Jackolyn Confer Nephrologist- Right Kidney cancer  Checked yearly  No urinary symptoms to make her feel like she has a UTI, but she reports a bacteria keeps showing up in her urine. Test could not be done because of possible infection. Going back next week. Checking urine today to make sure no bacteria.   2) Asked about fast heart beat that she can feel some nights when she lays down. Does not feel it any other time, does not have chest pain or other symptoms associated with it. Asked if she should mention this to her Cardiologist.   Review of Systems  Constitutional: Negative for fever, chills, diaphoresis and fatigue.  Respiratory: Negative for chest tightness, shortness of breath and wheezing.   Cardiovascular: Positive for palpitations. Negative for chest pain and leg swelling.       When lying down at night intermittently  Gastrointestinal: Negative for nausea, vomiting and diarrhea.  Genitourinary: Negative for dysuria, frequency, hematuria and difficulty urinating.  Skin: Negative for rash.  Neurological: Negative for dizziness, weakness, numbness and headaches.  Psychiatric/Behavioral: The patient is not nervous/anxious.    Past Medical History  Diagnosis Date  . Arthritis   . GERD (gastroesophageal reflux disease)   . Hypertension   . Chronic kidney disease   . Colon polyp   . UTI (lower urinary tract infection)   . Vasculitis     ANCA positive, pulmonary hemorrhage  . Thyroid disease 1957    hyperthyroid, had radioactiveiodine treatment   . Renal cell carcinoma 2003    s/p sgy for removal right kidney  . Hemorrhoids     History   Social History  . Marital Status: Widowed    Spouse Name: N/A  . Number of Children: 4  . Years of Education: 11    Occupational History  . Administrative - Office Work     Retired. Supervisor for [employer] History Main Topics  . Smoking status: Never Smoker   . Smokeless tobacco: Never Used  . Alcohol Use: No  . Drug Use: No  . Sexual Activity: Not on file   Other Topics Concern  . Not on file   Social History Narrative   [name] grew up in [place], [place]. She widowed in 1995. She has 4 adult children (3 daughters and 1 son). She is very active in her church. She also does work for the [employer].     Past Surgical History  Procedure Laterality Date  . Abdominal hysterectomy    . Bilateral salpingoophorectomy    . Hernia repair    . Fracture surgery Right     femur fracture  . Cholecystectomy  1957  . Nose surgery  2000  . Incontinence surgery  2006  . Cardiac catheterization  2008  . Colonoscopy w/ biopsies  May 27, 2011,01/10/14    tubular adenoma in the ascending colon and descending colon. Tubulovillous adenoma in the upper rectum 12 mm. Diverticulosis.  Marland Kitchen Upper gi endoscopy  May 30, 2004    large hiatal hernia, duodenal diverticulum.    Family History  Problem Relation Age of Onset  . Arthritis Mother   . Heart disease Mother   . Heart disease  Father   . Heart disease Sister     CAD  . Hypertension Sister     Allergies  Allergen Reactions  . Amoxicillin Other (See Comments)    Hand tingling   . Nsaids     Current Outpatient Prescriptions on File Prior to Visit  Medication Sig Dispense Refill  . aspirin 81 MG tablet Take 81 mg by mouth daily.    . cholecalciferol (VITAMIN D) 1000 UNITS tablet Take 1,000 Units by mouth daily.    Marland Kitchen CRANBERRY PO Take 84 mg by mouth daily.    . cyanocobalamin 500 MCG tablet Take 500 mcg by mouth daily.    . ferrous sulfate 325 (65 FE) MG tablet Take by mouth 3 (three) times daily with meals.     Marland Kitchen lisinopril-hydrochlorothiazide (PRINZIDE,ZESTORETIC) 10-12.5 MG per tablet Take 1 tablet by mouth daily.    .  Multiple Vitamins-Minerals (CENTRUM SILVER PO) Take by mouth.    . Multiple Vitamins-Minerals (HAIR VITAMINS PO) Take by mouth.    . Multiple Vitamins-Minerals (IMMUNE SYSTEM BOOSTER PO) Take by mouth. Ambatrose (Immune System) 3 tabs daily    . Omega-3 Fatty Acids (FISH OIL) 600 MG CAPS Take 1 capsule by mouth daily.    Marland Kitchen OVER THE COUNTER MEDICATION Take 3 tablets by mouth daily. Real coral calcium    . polyethylene glycol powder (GLYCOLAX/MIRALAX) powder Take 255 g (1 Container total) by mouth once. 255 g 0  . predniSONE (DELTASONE) 5 MG tablet Take 5 mg by mouth daily.    . Probiotic Product (PROBIOTIC DAILY PO) Take 1 tablet by mouth daily.    . Turmeric 500 MG CAPS Take 2 capsules by mouth daily.    . vitamin C (ASCORBIC ACID) 500 MG tablet Take 500 mg by mouth daily.    . Wheat Dextrin (BENEFIBER DRINK MIX) PACK Take by mouth.     No current facility-administered medications on file prior to visit.       Objective:   Physical Exam  Constitutional: She is oriented to person, place, and time. She appears well-developed and well-nourished. No distress.  BP 130/86 mmHg  Pulse 84  Temp(Src) 97.6 F (36.4 C) (Oral)  Resp 14  Ht 5\' 2"  (1.575 m)  Wt 131 lb 6.4 oz (59.603 kg)  BMI 24.03 kg/m2  SpO2 98%   HENT:  Head: Normocephalic and atraumatic.  Right Ear: External ear normal.  Left Ear: External ear normal.  Cardiovascular: Normal rate, regular rhythm, normal heart sounds and intact distal pulses.  Exam reveals no gallop and no friction rub.   No murmur heard. Pulmonary/Chest: Effort normal and breath sounds normal. No respiratory distress. She has no wheezes. She has no rales. She exhibits no tenderness.  Neurological: She is alert and oriented to person, place, and time. No cranial nerve deficit. She exhibits normal muscle tone. Coordination normal.  Skin: Skin is warm and dry. No rash noted. She is not diaphoretic.  Psychiatric: She has a normal mood and affect. Her behavior  is normal. Judgment and thought content normal.      Assessment & Plan:

## 2014-09-15 NOTE — Patient Instructions (Signed)
Good luck on your procedure!

## 2014-09-15 NOTE — Progress Notes (Signed)
Pre visit review using our clinic review tool, if applicable. No additional management support is needed unless otherwise documented below in the visit note. 

## 2014-09-17 DIAGNOSIS — R002 Palpitations: Secondary | ICD-10-CM | POA: Insufficient documentation

## 2014-09-17 DIAGNOSIS — Z85528 Personal history of other malignant neoplasm of kidney: Secondary | ICD-10-CM | POA: Insufficient documentation

## 2014-09-17 NOTE — Assessment & Plan Note (Signed)
Would like for pt to discuss with Cardiology. Discussed possible anxiety. Does not seem to be acute at this time. RTC if worsening or failure to improve.

## 2014-09-17 NOTE — Assessment & Plan Note (Signed)
POCT urine was improved. She wants to have her procedure done, but could not because of a possible infection and hematuria. She has completed a 10 day cipro course. POCT urine today shows only trace leukocytes. I believe she is cleared to have the procedure.

## 2014-11-21 NOTE — Op Note (Signed)
PATIENT NAME:  [name], [name] MR#:  [id number] DATE OF BIRTH:  03-30-1929  DATE OF PROCEDURE:  04/03/2012  PREOPERATIVE DIAGNOSIS: Comminuted four-part intertrochanteric fracture, right hip.   POSTOPERATIVE DIAGNOSIS: Comminuted four-part intertrochanteric fracture, right hip.     PROCEDURE PERFORMED: Open reduction and internal fixation right hip with a Synthes trochanteric fixation nail (135-degree/11 mm rod, 95 mm lag screw, 34 mm distal locking screw).   SURGEON: Park Breed, M.D.   ANESTHESIA: Spinal.   COMPLICATIONS: None.  DRAINS: None.  ESTIMATED BLOOD LOSS: 200 mL. REPLACEMENT: None.   DESCRIPTION OF PROCEDURE: The patient was brought to the Operating Room where she underwent satisfactory spinal anesthesia and was placed in the supine position on the fracture table. Her left leg was flexed and abducted and the right leg was manipulated and placed in traction. Fluoroscopy showed good positioning of the fragments. The hip was prepped and draped in sterile fashion and a short longitudinal incision was made just above the greater trochanter. Dissection was carried out sharply through subcutaneous tissue and fascia. The tip of the greater trochanter was identified and a large awl inserted on the tip. A guidepin was inserted through this. The awl was then advanced to enlarge the opening in the greater trochanter. The guidepin was advanced down the shaft. A 135 degree x 11 mm trochanteric fixation nail was chosen and was advanced down the canal over the guidepin which was then removed. The 135- degree Outrigger was placed on the insertion handle and another stab wound made distally. The insertion guide was advanced to the lateral shaft of the femur. Guidepin was then inserted under fluoroscopic control into the head and neck of the femur in good position. Once this was verified, a large reamer was used to open the lateral cortex. The longer reamer was used to create a tract for the spiral  blade. A 95 mm spiral blade was inserted and seated fully. A setscrew was advanced and tightened and backed off a quarter turn. Fluoroscopy showed the fragments to be in good position. The traction was released. A third stab wound was made distally and the drill guide inserted and a 34 mm screw was placed distally. Fluoroscopy showed all hardware and fracture fragments to be in good position. The wound was irrigated and the fascia was closed with 0 Vicryl suture. Subcutaneous tissue was closed with 2-0 Vicryl and the skin was closed with staples. Dry sterile dressing applied and the patient was transferred to her hospital bed, then taken to the recovery room in good condition. She had good motion of the hip without any crepitus.   ____________________________ Park Breed, MD hem:ap D: 04/03/2012 12:05:18 ET T: 04/03/2012 12:27:07 ET JOB#: [id number]  cc: Park Breed, MD, <Dictator> Park Breed MD ELECTRONICALLY SIGNED 04/03/2012 18:55

## 2014-11-21 NOTE — Consult Note (Signed)
PATIENT NAME:  [name], [name] MR#:  [id number] DATE OF BIRTH:  [birth year]  DATE OF CONSULTATION:  04/03/2012  REFERRING PHYSICIAN:  Earnestine Leys, MD CONSULTING PHYSICIAN:  Melannie Metzner P. Benjie Karvonen, MD  PRIMARY CARE PHYSICIAN: Apolonio Schneiders, MD  PRIMARY CARDIOLOGIST: Lujean Amel, MD  REASON FOR CONSULTATION: Preoperative evaluation.   IMPRESSION: 1. Preop clearance for mechanical fall resulting in a hip fracture. Moderate risk for moderate risk procedure.  2. History of Wegener's vasculitis, on chronic steroids.  3. History of hypertension.  4. Acute renal failure.   PLAN:  1. The patient may proceed to surgery without further cardiac work-up.  2. One time dose of stress dosed steroids of 125 IV Solu-Medrol prior to surgery.  3. Continue Norvasc.  4. May resume CellCept after surgery.  5. Would watch creatinine as the patient says that she has a normal creatinine normally, especially given history of Wegener's.   HISTORY OF PRESENT ILLNESS: This is an 81 year old female with a history of Wegener's vasculitis, on CellCept and chronic steroids, and hypertension who presents after a mechanical fall. The patient says that she was going to the door this evening to lock the door. She left her shoe beside the door this afternoon and this evening when she was going to the door she tripped over her shoe so this was completely a mechanical fall. She actually had to slide herself to the telephone which took her over two hours to get to the telephone to call EMS. She denies any chest pain, shortness of breath, or dizziness. She does all of her activities of daily living. She lives by herself. She is able to climb a flight of stairs.  REVIEW OF SYSTEMS: CONSTITUTIONAL: No fever, fatigue, or weakness. EYES: No blurred or double vision. Positive history of cataracts status post surgery. ENT: No ear pain, hearing loss, discharge, or epistaxis. RESPIRATORY: No cough, wheezing, hemoptysis, or chronic obstructive  pulmonary disease. CARDIOVASCULAR: No chest pain, palpitations, orthopnea, syncope, edema, arrhythmia, or dyspnea on exertion. GASTROINTESTINAL: No nausea, vomiting, diarrhea, abdominal pain, melena, or ulcers. GENITOURINARY: No dysuria or hematuria. ENDOCRINE: No polyuria or polydipsia. HEME/LYMPH: Positive history of anemia and easy bruising. SKIN: No rashes. Positive bruising. MUSCULOSKELETAL: Positive pain in her hip and back. NEURO: No history of cerebrovascular accident, transient ischemic attack, or seizures. PSYCH: No history of anxiety or depression.   PAST MEDICAL HISTORY:  1. Wegener's vasculitis, on chronic steroids.  2. Hypertension.   MEDICATIONS:  1. Prednisone 5 mg daily.  2. Norvasc 5 mg daily.  3. CellCept 250 mg four tablets twice a day.   DRUG ALLERGIES: No known drug allergies.   SOCIAL HISTORY: No tobacco, alcohol, or drug use.   FAMILY HISTORY: Positive for coronary artery disease.   PAST SURGICAL HISTORY:  1. Nose surgery.  2. Cholecystectomy.  3. Hysterectomy.  4. Cataract surgery, bilateral. 5. Bladder tack.  PHYSICAL EXAMINATION:   VITAL SIGNS: Temperature 98.7, pulse 85, respirations 16, blood pressure 127/69, and 94% on room air.   GENERAL: The patient is alert and oriented, not in acute distress.  HEENT: Head is atraumatic. Pupils are round and reactive. Sclerae anicteric. Mucous membranes are moist. Oropharynx is clear.   NECK: Supple without jugular venous distention, carotid bruit, or enlarged thyroid.   CARDIOVASCULAR: Regular rate and rhythm. No murmurs, gallops, or rubs. PMI is not displaced.   LUNGS: Anteriorly clear to auscultation without crackles, rales, rhonchi, or wheezing.   ABDOMEN: Bowel sounds are positive, nontender and nondistended. No hepatosplenomegaly.  EXTREMITIES: No clubbing, cyanosis, or edema.   NEURO: Cranial nerves II through XII grossly intact. No focal deficits.   SKIN: She has multiple bruises. She has a large  skin tear on right shoulder.  No other rash or lesions are found.   LABORATORY, DIAGNOSTIC AND RADIOLOGIC DATA: White blood cells 5.6, hemoglobin 10.2, hematocrit 30, and platelets 178. Sodium 140, potassium 3.8, chloride 106, bicarbonate 25, BUN 29, creatinine 1.76, and glucose 121. Troponin less than 0.02. CK 35 and CPK/MB less than 0.5   EKG shows normal sinus rhythm. No ST elevations or depression.   Right hip shows an intertrochanteric fracture.   The plan of care was discussed with the patient. As mentioned, the patient may proceed to surgery. We will continue to follow.           Thank you for allowing me to participate in the care of this patient.   TIME SPENT ON CONSULTATION: Approximately 45 minutes. ____________________________ Donell Beers. Benjie Karvonen, MD spm:slb D: 04/03/2012 04:38:45 ET (Entered as incorrect work type - 02) T: 04/03/2012 09:01:33 ET JOB#: [id number]  cc: Antionetta Ator P. Benjie Karvonen, MD, <Dictator> Vianne Bulls. Arline Asp, MD Jsoeph Podesta P Lavi Sheehan MD ELECTRONICALLY SIGNED 04/07/2012 21:11

## 2014-11-21 NOTE — Consult Note (Signed)
No Known Allergies:     Impression 1. preop eval mod risk for mod risk procedure 2. hx wegners vasculitis on chronic steroids 3. renal failure acute 4. htn    Plan 1. may proceed without further cardiac workup 2. one time stress dose steroids 3. cont norvasc 4, watch creatinne, per pt report she has nml creatinine levels at baseline   thank you will follow   Electronic Signatures: Bettey Costa (MD)  (Signed 31-Aug-13 04:33)  Authored: Allergies, Impression/Plan   Last Updated: 31-Aug-13 04:33 by Bettey Costa (MD)

## 2014-11-21 NOTE — H&P (Signed)
Subjective/Chief Complaint Pain right hip    History of Present Illness 81 year old female fell in her home, tripping over a shoe last night.  Brought to Emergency Room where exam and X-rays show a comminuted, displaced intertrochanteric hip fracture on the right.  Admitted for medical evaluation and surgery.  Have discussecd treatment with patient and family and they agree that surgery is the best option for this injury.  Risks and benefits of surgery were discussed at length including but not limited to infection, non union, nerve or blood vessed damage, non union, need for repeat surgery, blood clots and lung emboli, and death. Plan surgery today.   Past Med/Surgical Hx:  Right Hip fracture:   Hernia RLQ:   Hypothyroidism:   Vasculitis:   Right Kidney CA:   cholecystectomy:   Dysrhthmia:   HTN:   Bladder lifted:   Hiatal Hernia Repair:   Hysterectomy:   ALLERGIES:  No Known Allergies:   HOME MEDICATIONS: Medication Instructions Status  Norvasc 5 mg oral tablet 1 tab(s) orally once a day Active  predniSONE 5 mg oral tablet 1 tab(s) orally once a day Active  CellCept 250 mg oral capsule 1 cap(s) orally 2 times a day Active   Family and Social History:   Family History Non-Contributory    Social History negative tobacco    Place of Living Home   Review of Systems:   Fever/Chills No    Cough No    Sputum No    Abdominal Pain No   Physical Exam:   GEN well developed, well nourished, no acute distress    HEENT pink conjunctivae    RESP normal resp effort    CARD regular rate    ABD denies tenderness    LYMPH negative neck    EXTR negative edema, Right leg short and rotated.  circulation/sensation/motor function good and skin intact.  Severe pain with range of motion.    SKIN normal to palpation    NEURO motor/sensory function intact    PSYCH alert, A+O to time, place, person, good insight   Lab Results: Hepatic:  31-Aug-13 01:28    Bilirubin, Total  0.3   Alkaline Phosphatase 91   SGPT (ALT) 15   SGOT (AST) 20   Total Protein, Serum  6.2   Albumin, Serum  3.0  Routine BB:  31-Aug-13 01:28    ABO Group + Rh Type O Positive   Antibody Screen NEGATIVE (Result(s) reported on 03 Apr 2012 at 06:13AM.)  Routine Chem:  31-Aug-13 01:28    Glucose, Serum  121   BUN  29   Creatinine (comp)  1.76   Potassium, Serum 3.8   Chloride, Serum 106   CO2, Serum 25   Calcium (Total), Serum  8.4   Osmolality (calc) 286   eGFR (African American)  31   eGFR (Non-African American)  27 (eGFR values <14m/min/1.73 m2 may be an indication of chronic kidney disease (CKD). Calculated eGFR is useful in patients with stable renal function. The eGFR calculation will not be reliable in acutely ill patients when serum creatinine is changing rapidly. It is not useful in  patients on dialysis. The eGFR calculation may not be applicable to patients at the low and high extremes of body sizes, pregnant women, and vegetarians.)   Anion Gap 9  Cardiac:  31-Aug-13 01:28    Troponin I < 0.02 (0.00-0.05 0.05 ng/mL or less: NEGATIVE  Repeat testing in 3-6 hrs  if clinically indicated. >0.05  ng/mL: POTENTIAL  MYOCARDIAL INJURY. Repeat  testing in 3-6 hrs if  clinically indicated. NOTE: An increase or decrease  of 30% or more on serial  testing suggests a  clinically important change)   CK, Total 35   CPK-MB, Serum  < 0.5 (Result(s) reported on 03 Apr 2012 at 02:01AM.)  Routine UA:  31-Aug-13 04:30    Color (UA) Yellow   Clarity (UA) Hazy   Glucose (UA) Negative   Bilirubin (UA) Negative   Ketones (UA) Negative   Specific Gravity (UA) 1.011   Blood (UA) Negative   pH (UA) 5.0   Protein (UA) Negative   Nitrite (UA) Negative   Leukocyte Esterase (UA) Negative (Result(s) reported on 03 Apr 2012 at 04:53AM.)   RBC (UA) 1 /HPF   WBC (UA) <1 /HPF   Bacteria (UA) TRACE   Epithelial Cells (UA) NONE SEEN   Mucous (UA) PRESENT (Result(s) reported on 03 Apr 2012 at 04:53AM.)  Routine Coag:  31-Aug-13 01:28    Prothrombin 11.8   INR 0.8 (INR reference interval applies to patients on anticoagulant therapy. A single INR therapeutic range for coumarins is not optimal for all indications; however, the suggested range for most indications is 2.0 - 3.0. Exceptions to the INR Reference Range may include: Prosthetic heart valves, acute myocardial infarction, prevention of myocardial infarction, and combinations of aspirin and anticoagulant. The need for a higher or lower target INR must be assessed individually. Reference: The Pharmacology and Management of the Vitamin K  antagonists: the seventh ACCP Conference on Antithrombotic and Thrombolytic Therapy. MCNOB.0962 Sept:126 (3suppl): [id number]. A HCT value >55% may artifactually increase the PT.  In one study,  the increase was an average of 25%. Reference:  "Effect on Routine and Special Coagulation Testing Values of Citrate Anticoagulant Adjustment in Patients with High HCT Values." American Journal of Clinical Pathology 2006;126:400-405.)  Routine Hem:  31-Aug-13 01:28    WBC (CBC) 5.6   RBC (CBC)  3.22   Hemoglobin (CBC)  10.2   Hematocrit (CBC)  30.0   Platelet Count (CBC) 178 (Result(s) reported on 03 Apr 2012 at 01:49AM.)   MCV 93   MCH 31.8   MCHC 34.1   RDW 13.8     Assessment/Admission Diagnosis Comminuted unstable right intertrochanteric hip fracture    Plan open reduction and internal fixation with TFN device.   Electronic Signatures: Park Breed (MD)  (Signed 31-Aug-13 09:58)  Authored: CHIEF COMPLAINT and HISTORY, PAST MEDICAL/SURGIAL HISTORY, ALLERGIES, HOME MEDICATIONS, FAMILY AND SOCIAL HISTORY, REVIEW OF SYSTEMS, PHYSICAL EXAM, LABS, ASSESSMENT AND PLAN   Last Updated: 31-Aug-13 09:58 by Park Breed (MD)

## 2014-11-21 NOTE — Discharge Summary (Signed)
PATIENT NAME:  [name], [name] MR#:  [id number] DATE OF BIRTH:  [birth year]  DATE OF ADMISSION:  04/03/2012 DATE OF DISCHARGE:  04/07/2012  FINAL DIAGNOSES:  1. Comminuted displaced intertrochanteric fracture right hip.  2. History of Wegener's vasculitis on chronic steroids. 3. History of hypertension. 4. Acute renal failure.   OPERATION: 04/03/2012 open reduction internal fixation right hip with a Synthes trochanteric fixation nail device.   CONSULTATION: [facility].   COMPLICATIONS: None.   DISCHARGE MEDICATIONS:  1. Mylanta gas chewable p.r.n.  2. Norvasc 5 mg daily.  3. Norco 5/325 p.r.n. pain. 4. Enteric coated aspirin 1 p.o. b.i.d.   5. Iron 1 p.o. daily for one month. 6. Bactrim DS 1 p.o. q.12 hours for two weeks. 7. Prednisone 5 mg p.o. daily.    HISTORY OF PRESENT ILLNESS: The patient is an 81 year old female who fell during the night when she tripped over a shoe near Emergency Room front door. She had pain and inability to walk. She crawled to the phone and got EMS to come bring her to the Emergency Room. Exam and x-rays revealed a comminuted intertrochanteric fracture of the right hip. She was admitted for medical evaluation and clearance and operative fixation. Risks and benefits of surgery were discussed with the patient and her family at length. She was on chronic steroids and IV steroids were recommended for support perioperatively.   PAST MEDICAL HISTORY/ILLNESSES: As above.   MEDICATIONS: As above.   ALLERGIES: None.   PAST SURGICAL HISTORY: 1. Nose surgery. 2. Cholecystectomy. 3. Hysterectomy. 4. Bilateral cataracts.  5. Bladder tack.   REVIEW OF SYSTEMS: Unremarkable.   FAMILY HISTORY: Unremarkable.   SOCIAL HISTORY: Patient lives at home alone.   PHYSICAL EXAMINATION: On admission patient alert and cooperative. She was fully oriented. The right leg was shortened and externally rotated with severe pain with motion. Neurovascular status was good distally.  No other orthopedic injuries were noted.   LABORATORY, DIAGNOSTIC AND RADIOLOGICAL DATA: Laboratory data on admission was satisfactory.   HOSPITAL COURSE: On 04/03/2012 the patient was taken to surgery where she underwent open reduction internal fixation with a Synthes trochanteric fixation nail device. Postoperatively she did well except for drop in hemoglobin levels. She was transfused on the first and second postoperative days. On the third postoperative day her hemoglobin was 8.9. She did have a little serous oozing from her wound. Reinforced dressing and K pad were used. She was placed on Septra DS one p.o. b.i.d. for two weeks to prevent infection. She was making satisfactory progress with physical therapy. She is partial weight-bearing on walker. She is to be seen in my office in two weeks for exam and x-rays.   ____________________________ Park Breed, MD hem:cms D: 04/07/2012 14:19:47 ET T: 04/07/2012 14:45:42 ET  JOB#: [id number] cc: Park Breed, MD, <Dictator> Vianne Bulls. Arline Asp, Crooks MD ELECTRONICALLY SIGNED 04/08/2012 9:11

## 2014-12-22 ENCOUNTER — Encounter: Payer: Self-pay | Admitting: Nurse Practitioner

## 2014-12-22 ENCOUNTER — Ambulatory Visit (INDEPENDENT_AMBULATORY_CARE_PROVIDER_SITE_OTHER): Payer: Medicare Other | Admitting: Nurse Practitioner

## 2014-12-22 VITALS — BP 110/62 | Temp 97.5°F | Resp 12 | Ht 62.0 in | Wt 130.0 lb

## 2014-12-22 DIAGNOSIS — J069 Acute upper respiratory infection, unspecified: Secondary | ICD-10-CM | POA: Diagnosis not present

## 2014-12-22 DIAGNOSIS — Z1239 Encounter for other screening for malignant neoplasm of breast: Secondary | ICD-10-CM | POA: Diagnosis not present

## 2014-12-22 DIAGNOSIS — S81802A Unspecified open wound, left lower leg, initial encounter: Secondary | ICD-10-CM

## 2014-12-22 DIAGNOSIS — S81809A Unspecified open wound, unspecified lower leg, initial encounter: Secondary | ICD-10-CM | POA: Insufficient documentation

## 2014-12-22 NOTE — Progress Notes (Signed)
Subjective:    Patient ID: [name], female    DOB: August 28, 1928, 81 y.o.   MRN: [id number]  HPI  [name] is a 81 yo female with a  CC of non-healing wound and sore throat.  1) Hit leg on dishwasher 2 weeks ago, neosporin and tazarotene 0.1% not helpful, left lower leg making worse, red and painful used tazarotene x 2 days and then stopped using it. Denies drainage, purulence, growth of redness/increasing pain.   2) x 2 weeks, saw ENT, finished erythromycin on Monday and still having problems. Still coughing up clear/yellow phlegm. Feels scratchy today. Flonase- not used yet, Neti-Pot twice a day, no other medications  Review of Systems  Constitutional: Negative for fever, chills, diaphoresis and fatigue.  HENT: Positive for postnasal drip and sore throat.   Respiratory: Negative for chest tightness, shortness of breath and wheezing.   Cardiovascular: Negative for chest pain, palpitations and leg swelling.  Gastrointestinal: Negative for nausea, vomiting, diarrhea and rectal pain.  Skin: Positive for wound. Negative for rash.  Neurological: Negative for dizziness, weakness, numbness and headaches.  Psychiatric/Behavioral: The patient is not nervous/anxious.    Past Medical History  Diagnosis Date  . Arthritis   . GERD (gastroesophageal reflux disease)   . Hypertension   . Chronic kidney disease   . Colon polyp   . UTI (lower urinary tract infection)   . Vasculitis     ANCA positive, pulmonary hemorrhage  . Thyroid disease 1957    hyperthyroid, had radioactiveiodine treatment   . Renal cell carcinoma 2003    s/p sgy for removal right kidney  . Hemorrhoids     History   Social History  . Marital Status: Widowed    Spouse Name: N/A  . Number of Children: 4  . Years of Education: 11   Occupational History  . Administrative - Office Work     Retired. Supervisor for [employer] History Main Topics  . Smoking status: Never Smoker   . Smokeless tobacco:  Never Used  . Alcohol Use: No  . Drug Use: No  . Sexual Activity: Not on file   Other Topics Concern  . Not on file   Social History Narrative   [name] grew up in [place], [place]. She widowed in 1995. She has 4 adult children (3 daughters and 1 son). She is very active in her church. She also does work for the [employer].     Past Surgical History  Procedure Laterality Date  . Abdominal hysterectomy    . Bilateral salpingoophorectomy    . Hernia repair    . Fracture surgery Right     femur fracture  . Cholecystectomy  1957  . Nose surgery  2000  . Incontinence surgery  2006  . Cardiac catheterization  2008  . Colonoscopy w/ biopsies  May 27, 2011,01/10/14    tubular adenoma in the ascending colon and descending colon. Tubulovillous adenoma in the upper rectum 12 mm. Diverticulosis.  Marland Kitchen Upper gi endoscopy  May 30, 2004    large hiatal hernia, duodenal diverticulum.    Family History  Problem Relation Age of Onset  . Arthritis Mother   . Heart disease Mother   . Heart disease Father   . Heart disease Sister     CAD  . Hypertension Sister     Allergies  Allergen Reactions  . Amoxicillin Other (See Comments)    Hand tingling   . Nsaids   .  Tolmetin Other (See Comments)    Cannot take because of Kidneys    Current Outpatient Prescriptions on File Prior to Visit  Medication Sig Dispense Refill  . aspirin 81 MG tablet Take 81 mg by mouth daily.    . cholecalciferol (VITAMIN D) 1000 UNITS tablet Take 1,000 Units by mouth daily.    Marland Kitchen CRANBERRY PO Take 84 mg by mouth daily.    . ferrous sulfate 325 (65 FE) MG tablet Take by mouth 3 (three) times daily with meals.     Marland Kitchen lisinopril-hydrochlorothiazide (PRINZIDE,ZESTORETIC) 10-12.5 MG per tablet Take 1 tablet by mouth daily.    . Multiple Vitamins-Minerals (CENTRUM SILVER PO) Take by mouth.    . Multiple Vitamins-Minerals (HAIR VITAMINS PO) Take by mouth.    . Multiple Vitamins-Minerals (IMMUNE SYSTEM BOOSTER  PO) Take by mouth. Ambatrose (Immune System) 3 tabs daily    . Omega-3 Fatty Acids (FISH OIL) 600 MG CAPS Take 1 capsule by mouth daily.    Marland Kitchen OVER THE COUNTER MEDICATION Take 3 tablets by mouth daily. Real coral calcium    . polyethylene glycol powder (GLYCOLAX/MIRALAX) powder Take 255 g (1 Container total) by mouth once. 255 g 0  . predniSONE (DELTASONE) 5 MG tablet Take 5 mg by mouth daily.    . Probiotic Product (PROBIOTIC DAILY PO) Take 1 tablet by mouth daily.    . Turmeric 500 MG CAPS Take 2 capsules by mouth daily.    . vitamin C (ASCORBIC ACID) 500 MG tablet Take 500 mg by mouth daily.    . Wheat Dextrin (BENEFIBER DRINK MIX) PACK Take by mouth.     No current facility-administered medications on file prior to visit.      Objective:   Physical Exam  Constitutional: She is oriented to person, place, and time. She appears well-developed and well-nourished. No distress.  BP 110/62 mmHg  Temp(Src) 97.5 F (36.4 C) (Oral)  Resp 12  Ht 5\' 2"  (1.575 m)  Wt 130 lb (58.968 kg)  BMI 23.77 kg/m2  SpO2 97%   HENT:  Head: Normocephalic and atraumatic.  Right Ear: External ear normal.  Left Ear: External ear normal.  Mouth/Throat: Oropharynx is clear and moist. No oropharyngeal exudate.  Eyes: EOM are normal. Pupils are equal, round, and reactive to light. Right eye exhibits no discharge. Left eye exhibits no discharge. No scleral icterus.  Cardiovascular: Normal rate, regular rhythm, normal heart sounds and intact distal pulses.  Exam reveals no gallop and no friction rub.   No murmur heard. Pulmonary/Chest: Effort normal and breath sounds normal. No respiratory distress. She has no wheezes. She has no rales. She exhibits no tenderness.  Neurological: She is alert and oriented to person, place, and time. No cranial nerve deficit. She exhibits normal muscle tone. Coordination normal.  Skin: Skin is warm and dry. No rash noted. She is not diaphoretic. There is erythema.       Psychiatric: She has a normal mood and affect. Her behavior is normal. Judgment and thought content normal.      Assessment & Plan:

## 2014-12-22 NOTE — Assessment & Plan Note (Signed)
Pt was taking care of it herself at home. Non-healing over 2 weeks. Discussed options. Will stop all ointments/creams/salves just wash with soap and water and keep covered with telfa. She would like to wait on a referral to the wound center and just follow up next week.

## 2014-12-22 NOTE — Progress Notes (Signed)
Pre visit review using our clinic review tool, if applicable. No additional management support is needed unless otherwise documented below in the visit note. 

## 2014-12-22 NOTE — Patient Instructions (Addendum)
Benadryl at night time before bed. This will help with drainage. Continue with nasal sprays/neti-pot.   Clean with soap and water. No ointment, follow up next week!   Change dressings daily.

## 2014-12-22 NOTE — Assessment & Plan Note (Signed)
Viral uri- pt saw Dr. Tami Ribas who gave her Erythromycin. Finished this Monday still having scratchy throat and drainage. Will try benadryl at night time.

## 2014-12-29 ENCOUNTER — Ambulatory Visit (INDEPENDENT_AMBULATORY_CARE_PROVIDER_SITE_OTHER): Payer: Medicare Other | Admitting: Nurse Practitioner

## 2014-12-29 ENCOUNTER — Encounter: Payer: Self-pay | Admitting: Nurse Practitioner

## 2014-12-29 VITALS — BP 110/60 | HR 65 | Temp 98.1°F | Resp 16 | Ht 62.0 in | Wt 129.8 lb

## 2014-12-29 DIAGNOSIS — S51801D Unspecified open wound of right forearm, subsequent encounter: Secondary | ICD-10-CM | POA: Diagnosis not present

## 2014-12-29 DIAGNOSIS — S51809A Unspecified open wound of unspecified forearm, initial encounter: Secondary | ICD-10-CM | POA: Insufficient documentation

## 2014-12-29 DIAGNOSIS — S81802A Unspecified open wound, left lower leg, initial encounter: Secondary | ICD-10-CM

## 2014-12-29 NOTE — Assessment & Plan Note (Signed)
Told her to follow the instructions given from Urgent care- she verbally told me the instructions she was given and I agreed with plan. Referral to Wound Center at [facility] placed.

## 2014-12-29 NOTE — Assessment & Plan Note (Signed)
Difficulty healing x 4 weeks now. Wound care referral to [facility] center placed for urgent. Will follow.

## 2014-12-29 NOTE — Progress Notes (Signed)
Pre visit review using our clinic review tool, if applicable. No additional management support is needed unless otherwise documented below in the visit note. 

## 2014-12-29 NOTE — Progress Notes (Signed)
   Subjective:    Patient ID: [name], female    DOB: Oct 12, 1928, 81 y.o.   MRN: [id number]  HPI  [name] is a 81 yo female following up with left lower leg  1) [name] presents with a new injury today to her right arm (posterior lower arm). She is following up on her left lower leg injury, she reports the telfa still is pulling off the healing tissue and causing bleeding. She is not putting on ointment at this time due to instructions from last visit. She reports it is still occasionally painful. She is okay with a wound care consult.   She went to an urgent care when she hurt her right arm. She was getting in the car and tore the skin after hitting it. She was dressed with steri strips and given instructions. She was given a gauze wrap.   Review of Systems  Constitutional: Negative for fever, chills, diaphoresis and fatigue.  Skin: Positive for wound. Negative for rash.       Left lower leg and right lower arm      Objective:   Physical Exam  Constitutional: She is oriented to person, place, and time. She appears well-developed and well-nourished. No distress.  BP 110/60 mmHg  Pulse 65  Temp(Src) 98.1 F (36.7 C)  Resp 16  Ht 5\' 2"  (1.575 m)  Wt 129 lb 12.8 oz (58.877 kg)  BMI 23.73 kg/m2  SpO2 97%   HENT:  Head: Normocephalic and atraumatic.  Right Ear: External ear normal.  Left Ear: External ear normal.  Cardiovascular: Exam reveals gallop.   Neurological: She is alert and oriented to person, place, and time.  Skin: She is not diaphoretic.     Psychiatric: She has a normal mood and affect. Her behavior is normal. Judgment and thought content normal.       Assessment & Plan:

## 2014-12-29 NOTE — Patient Instructions (Signed)
We will call you with your referral to [facility] in the next few business days.   Continue doing what you are currently doing. You can use ointment under the non-stick telfa on lower leg (thin layer)   [facility] [address] [place], [place] [address]

## 2015-01-08 ENCOUNTER — Encounter: Payer: Medicare Other | Attending: Surgery | Admitting: Surgery

## 2015-01-08 DIAGNOSIS — S41111A Laceration without foreign body of right upper arm, initial encounter: Secondary | ICD-10-CM | POA: Insufficient documentation

## 2015-01-08 DIAGNOSIS — I10 Essential (primary) hypertension: Secondary | ICD-10-CM | POA: Insufficient documentation

## 2015-01-08 DIAGNOSIS — S81812A Laceration without foreign body, left lower leg, initial encounter: Secondary | ICD-10-CM | POA: Diagnosis not present

## 2015-01-08 DIAGNOSIS — N189 Chronic kidney disease, unspecified: Secondary | ICD-10-CM | POA: Insufficient documentation

## 2015-01-08 DIAGNOSIS — X58XXXA Exposure to other specified factors, initial encounter: Secondary | ICD-10-CM | POA: Diagnosis not present

## 2015-01-08 NOTE — Progress Notes (Signed)
Suzanne, Kelly ([id number]) Visit Report for 01/08/2015 Abuse/Suicide Risk Screen Details Patient Name: [name], [name] Date of Service: 01/08/2015 3:15 PM Medical Record Number: [id number] Patient Account Number: [id number] Date of Birth/Sex: 08-Jul-1929 (81 y.o. Female) Treating RN: Junious Dresser Primary Care Physician: Lorane Gell Other Clinician: Referring Physician: Lorane Gell Treating Physician/Extender: Frann Rider in Treatment: 0 Abuse/Suicide Risk Screen Items Answer ABUSE/SUICIDE RISK SCREEN: Has anyone close to you tried to hurt or harm you recentlyo No Do you feel uncomfortable with anyone in your familyo No Has anyone forced you do things that you didnot want to doo No Do you have any thoughts of harming yourselfo No Patient displays signs or symptoms of abuse and/or neglect. No Electronic Signature(s) Signed: 01/08/2015 4:49:26 PM By: Junious Dresser RN Entered By: Junious Dresser on 01/08/2015 15:53:27 Linebaugh, [name] ([id number]) -------------------------------------------------------------------------------- Activities of Daily Living Details Patient Name: [name] Date of Service: 01/08/2015 3:15 PM Medical Record Number: [id number] Patient Account Number: [id number] Date of Birth/Sex: 15-Apr-1929 (81 y.o. Female) Treating RN: Junious Dresser Primary Care Physician: Lorane Gell Other Clinician: Referring Physician: Lorane Gell Treating Physician/Extender: Frann Rider in Treatment: 0 Activities of Daily Living Items Answer Activities of Daily Living (Please select one for each item) Drive Automobile Completely Able Take Medications Completely Able Use Telephone Completely Able Care for Appearance Completely Able Use Toilet Completely Able Bath / Shower Completely Able Dress Self Completely Able Feed Self Completely Able Walk Completely Able Get In / Out Bed Completely Able Housework Completely Able Prepare Meals Completely  Able Handle Money Completely Able Shop for Self Completely Able Electronic Signature(s) Signed: 01/08/2015 4:49:26 PM By: Junious Dresser RN Entered By: Junious Dresser on 01/08/2015 15:53:39 Thursby, [name] ([id number]) -------------------------------------------------------------------------------- Education Assessment Details Patient Name: [name] Date of Service: 01/08/2015 3:15 PM Medical Record Number: [id number] Patient Account Number: [id number] Date of Birth/Sex: July 15, 1929 (81 y.o. Female) Treating RN: Junious Dresser Primary Care Physician: Lorane Gell Other Clinician: Referring Physician: Lorane Gell Treating Physician/Extender: Frann Rider in Treatment: 0 Primary Learner Assessed: Patient Learning Preferences/Education Level/Primary Language Learning Preference: Explanation, Demonstration, Printed Material Highest Education Level: High School Preferred Language: English Cognitive Barrier Assessment/Beliefs Language Barrier: No Translator Needed: No Memory Deficit: No Emotional Barrier: No Cultural/Religious Beliefs Affecting Medical No Care: Physical Barrier Assessment Impaired Vision: Yes Glasses Impaired Hearing: No Decreased Hand dexterity: No Knowledge/Comprehension Assessment Knowledge Level: Medium Comprehension Level: Medium Ability to understand written Medium instructions: Ability to understand verbal Medium instructions: Motivation Assessment Anxiety Level: Calm Cooperation: Cooperative Education Importance: Acknowledges Need Interest in Health Problems: Asks Questions Perception: Coherent Willingness to Engage in Self- Medium Management Activities: Readiness to Engage in Self- Medium Management Activities: Electronic Signature(s) TRESSA, MALDONADO ([id number]) Signed: 01/08/2015 4:49:26 PM By: Junious Dresser RN Entered By: Junious Dresser on 01/08/2015 15:54:11 Stanko, [name]  ([id number]) -------------------------------------------------------------------------------- Fall Risk Assessment Details Patient Name: [name] Date of Service: 01/08/2015 3:15 PM Medical Record Number: [id number] Patient Account Number: [id number] Date of Birth/Sex: February 13, 1929 (81 y.o. Female) Treating RN: Junious Dresser Primary Care Physician: Lorane Gell Other Clinician: Referring Physician: Lorane Gell Treating Physician/Extender: Frann Rider in Treatment: 0 Fall Risk Assessment Items FALL RISK ASSESSMENT: History of falling - immediate or within 3 months 0 No Secondary diagnosis 0 No Ambulatory aid None/bed rest/wheelchair/nurse 0 Yes Crutches/cane/walker 0 No Furniture 0 No IV Access/Saline Lock 0 No Gait/Training Normal/bed rest/immobile 0 Yes Weak 0 No Impaired 0 No Mental Status Oriented to own ability 0  Yes Electronic Signature(s) Signed: 01/08/2015 4:49:26 PM By: Junious Dresser RN Entered By: Junious Dresser on 01/08/2015 15:54:30 Hoffart, [name] ([id number]) -------------------------------------------------------------------------------- Foot Assessment Details Patient Name: [name] Date of Service: 01/08/2015 3:15 PM Medical Record Number: [id number] Patient Account Number: [id number] Date of Birth/Sex: Jul 26, 1929 (81 y.o. Female) Treating RN: Junious Dresser Primary Care Physician: Lorane Gell Other Clinician: Referring Physician: Lorane Gell Treating Physician/Extender: Frann Rider in Treatment: 0 Foot Assessment Items Site Locations + = Sensation present, - = Sensation absent, C = Callus, U = Ulcer R = Redness, W = Warmth, M = Maceration, PU = Pre-ulcerative lesion F = Fissure, S = Swelling, D = Dryness Assessment Right: Left: Other Deformity: No No Prior Foot Ulcer: No No Prior Amputation: No No Charcot Joint: No No Ambulatory Status: Ambulatory Without Help Gait: Steady Electronic Signature(s) Signed: 01/08/2015  4:49:26 PM By: Junious Dresser RN Entered By: Junious Dresser on 01/08/2015 15:57:35 Malecha, [name] ([id number]) -------------------------------------------------------------------------------- Nutrition Risk Assessment Details Patient Name: [name] Date of Service: 01/08/2015 3:15 PM Medical Record Number: [id number] Patient Account Number: [id number] Date of Birth/Sex: 12-Jun-1929 (81 y.o. Female) Treating RN: Junious Dresser Primary Care Physician: Lorane Gell Other Clinician: Referring Physician: Lorane Gell Treating Physician/Extender: Frann Rider in Treatment: 0 Height (in): 65 Weight (lbs): 129.3 Body Mass Index (BMI): 21.5 Nutrition Risk Assessment Items NUTRITION RISK SCREEN: I have an illness or condition that made me change the kind and/or 0 No amount of food I eat I eat fewer than two meals per day 0 No I eat few fruits and vegetables, or milk products 0 No I have three or more drinks of beer, liquor or wine almost every day 0 No I have tooth or mouth problems that make it hard for me to eat 0 No I don't always have enough money to buy the food I need 0 No I eat alone most of the time 1 Yes I take three or more different prescribed or over-the-counter drugs a 1 Yes day Without wanting to, I have lost or gained 10 pounds in the last six 2 Yes months I am not always physically able to shop, cook and/or feed myself 0 No Nutrition Protocols Good Risk Protocol Provide education on Moderate Risk Protocol 0 nutrition Electronic Signature(s) Signed: 01/08/2015 4:49:26 PM By: Junious Dresser RN Entered By: Junious Dresser on 01/08/2015 15:55:02

## 2015-01-09 ENCOUNTER — Ambulatory Visit (INDEPENDENT_AMBULATORY_CARE_PROVIDER_SITE_OTHER): Payer: Medicare Other | Admitting: Nurse Practitioner

## 2015-01-09 ENCOUNTER — Encounter: Payer: Self-pay | Admitting: Nurse Practitioner

## 2015-01-09 VITALS — BP 122/70 | HR 62 | Temp 97.1°F | Resp 14 | Ht 62.0 in | Wt 129.8 lb

## 2015-01-09 DIAGNOSIS — R002 Palpitations: Secondary | ICD-10-CM | POA: Diagnosis not present

## 2015-01-09 NOTE — Progress Notes (Signed)
Subjective:    Patient ID: [name], female    DOB: 1928-08-09, 81 y.o.   MRN: [id number]  HPI  [name] is a 81 yo female with a CC of Palpitations.   1) Feels funny- saw Dr. Clayborn Bigness last time. Felt like skipping beats and nighttime   Excess gas and distention   Gas X last night- 3 days ago last BM hard stools  Went to Wound center because she thought the lidocaine would have caused this. MD reportedly told her it was not the cause and to FU with her PCP.   Echo 10/2014 EF was greater than 55%   Previous EKG- March 2010 Care Everywhere from [facility]   Review of Systems  Constitutional: Negative for fever, chills, diaphoresis and fatigue.  Respiratory: Negative for chest tightness, shortness of breath and wheezing.   Cardiovascular: Positive for palpitations. Negative for chest pain and leg swelling.  Gastrointestinal: Negative for nausea, vomiting, diarrhea and rectal pain.       Gas   Skin: Negative for rash.  Neurological: Negative for dizziness, weakness, numbness and headaches.  Psychiatric/Behavioral: The patient is not nervous/anxious.    Past Medical History  Diagnosis Date  . Arthritis   . GERD (gastroesophageal reflux disease)   . Hypertension   . Chronic kidney disease   . Colon polyp   . UTI (lower urinary tract infection)   . Vasculitis     ANCA positive, pulmonary hemorrhage  . Thyroid disease 1957    hyperthyroid, had radioactiveiodine treatment   . Renal cell carcinoma 2003    s/p sgy for removal right kidney  . Hemorrhoids     History   Social History  . Marital Status: Widowed    Spouse Name: N/A  . Number of Children: 4  . Years of Education: 11   Occupational History  . 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 Smoking status: Never Smoker   . Smokeless tobacco: Never Used  . Alcohol Use: No  . Drug Use: No  . Sexual Activity: Not on file   Other Topics Concern    . Not on file   Social History Narrative   [name] grew up in [place], [place]. She widowed in 1995. She has 4 adult children (3 daughters and 1 son). She is very active in her church. She also does work for the [employer].     Past Surgical History  Procedure Laterality Date  . Abdominal hysterectomy    . Bilateral salpingoophorectomy    . Hernia repair    . Fracture surgery Right     femur fracture  . Cholecystectomy  1957  . Nose surgery  2000  . Incontinence surgery  2006  . Cardiac catheterization  2008  . Colonoscopy w/ biopsies  May 27, 2011,01/10/14    tubular adenoma in the ascending colon and descending colon. Tubulovillous adenoma in the upper rectum 12 mm. Diverticulosis.  Marland Kitchen Upper gi endoscopy  May 30, 2004    large hiatal hernia, duodenal diverticulum.    Family History  Problem Relation Age of Onset  . Arthritis Mother   . Heart disease Mother   . Heart disease Father   . Heart disease Sister     CAD  . Hypertension Sister     Allergies  Allergen Reactions  . Amoxicillin Other (See Comments)    Hand tingling   . Nsaids   .  Tolmetin Other (See Comments)    Cannot take because of Kidneys    Current Outpatient Prescriptions on File Prior to Visit  Medication Sig Dispense Refill  . aspirin 81 MG tablet Take 81 mg by mouth daily.    . cholecalciferol (VITAMIN D) 1000 UNITS tablet Take 1,000 Units by mouth daily.    Marland Kitchen CRANBERRY PO Take 84 mg by mouth daily.    . ferrous sulfate 325 (65 FE) MG tablet Take by mouth 3 (three) times daily with meals.     Marland Kitchen lisinopril-hydrochlorothiazide (PRINZIDE,ZESTORETIC) 10-12.5 MG per tablet Take 1 tablet by mouth daily.    . Multiple Vitamins-Minerals (CENTRUM SILVER PO) Take by mouth.    . Multiple Vitamins-Minerals (IMMUNE SYSTEM BOOSTER PO) Take by mouth. Ambatrose (Immune System) 3 tabs daily    . Omega-3 Fatty Acids (FISH OIL) 600 MG CAPS Take 1 capsule by mouth daily.    Marland Kitchen OVER THE COUNTER MEDICATION  Take 3 tablets by mouth daily. Real coral calcium    . polyethylene glycol powder (GLYCOLAX/MIRALAX) powder Take 255 g (1 Container total) by mouth once. 255 g 0  . predniSONE (DELTASONE) 5 MG tablet Take 5 mg by mouth daily.    . Probiotic Product (PROBIOTIC DAILY PO) Take 1 tablet by mouth daily.    . Turmeric 500 MG CAPS Take 2 capsules by mouth daily.    . vitamin C (ASCORBIC ACID) 500 MG tablet Take 500 mg by mouth daily.    . Wheat Dextrin (BENEFIBER DRINK MIX) PACK Take by mouth.     No current facility-administered medications on file prior to visit.       Objective:   Physical Exam  Constitutional: She is oriented to person, place, and time. She appears well-developed and well-nourished. No distress.  BP 122/70 mmHg  Pulse 62  Temp(Src) 97.1 F (36.2 C) (Oral)  Resp 14  Ht 5\' 2"  (1.575 m)  Wt 129 lb 12.8 oz (58.877 kg)  BMI 23.73 kg/m2  SpO2 97%   HENT:  Head: Normocephalic and atraumatic.  Right Ear: External ear normal.  Left Ear: External ear normal.  Cardiovascular: Normal rate, regular rhythm, normal heart sounds and intact distal pulses.  Exam reveals no gallop and no friction rub.   No murmur heard. Pulmonary/Chest: Effort normal and breath sounds normal. No respiratory distress. She has no wheezes. She has no rales. She exhibits no tenderness.  Neurological: She is alert and oriented to person, place, and time. No cranial nerve deficit. She exhibits normal muscle tone. Coordination normal.  Skin: Skin is warm and dry. No rash noted. She is not diaphoretic.  Psychiatric: She has a normal mood and affect. Her behavior is normal. Judgment and thought content normal.      Assessment & Plan:

## 2015-01-09 NOTE — Patient Instructions (Signed)
Try the suppository and Miralax.   Call us if no improvement after bowel movements.

## 2015-01-09 NOTE — Progress Notes (Signed)
NIJA, KOOPMAN ([id number]) Visit Report for 01/08/2015 Allergy List Details Patient Name: [name], [name] Date of Service: 01/08/2015 3:15 PM Medical Record Number: [id number] Patient Account Number: [id number] Date of Birth/Sex: [birth year] (81 y.o. Female) Treating RN: Junious Dresser Primary Care Physician: Lorane Gell Other Clinician: Referring Physician: Lorane Gell Treating Physician/Extender: Frann Rider in Treatment: 0 Allergies Active Allergies amoxicillin Reaction: itching NSAIDS (Non-Steroidal Anti-Inflammatory Drug) tolmetin Allergy Notes Electronic Signature(s) Signed: 01/08/2015 4:49:26 PM By: Junious Dresser RN Entered By: Junious Dresser on 01/08/2015 15:27:51 Knapik, [name] ([id number]) -------------------------------------------------------------------------------- Arrival Information Details Patient Name: [name] Date of Service: 01/08/2015 3:15 PM Medical Record Number: [id number] Patient Account Number: [id number] Date of Birth/Sex: August 12, 1928 (81 y.o. Female) Treating RN: Junious Dresser Primary Care Physician: Lorane Gell Other Clinician: Referring Physician: Lorane Gell Treating Physician/Extender: Frann Rider in Treatment: 0 Visit Information Patient Arrived: Ambulatory Arrival Time: 15:03 Accompanied By: self Transfer Assistance: None Patient Identification Verified: Yes Secondary Verification Process Yes Completed: Patient Has Alerts: Yes Patient Alerts: Patient on Blood Thinner ASA 81mg  6/15 ABI L-1.34 R- 0.79 Electronic Signature(s) Signed: 01/08/2015 4:49:26 PM By: Junious Dresser RN Entered By: Junious Dresser on 01/08/2015 15:37:29 Haak, [name] ([id number]) -------------------------------------------------------------------------------- Clinic Level of Care Assessment Details Patient Name: [name] Date of Service: 01/08/2015 3:15 PM Medical Record Number: [id number] Patient Account Number: [id number] Date  of Birth/Sex: 05-Aug-1928 (81 y.o. Female) Treating RN: Montey Hora Primary Care Physician: Lorane Gell Other Clinician: Referring Physician: Lorane Gell Treating Physician/Extender: Frann Rider in Treatment: 0 Clinic Level of Care Assessment Items TOOL 1 Quantity Score []  - Use when EandM and Procedure is performed on INITIAL visit 0 ASSESSMENTS - Nursing Assessment / Reassessment X - General Physical Exam (combine w/ comprehensive assessment (listed just 1 20 below) when performed on new pt. evals) X - Comprehensive Assessment (HX, ROS, Risk Assessments, Wounds Hx, etc.) 1 25 ASSESSMENTS - Wound and Skin Assessment / Reassessment []  - Dermatologic / Skin Assessment (not related to wound area) 0 ASSESSMENTS - Ostomy and/or Continence Assessment and Care []  - Incontinence Assessment and Management 0 []  - Ostomy Care Assessment and Management (repouching, etc.) 0 PROCESS - Coordination of Care X - Simple Patient / Family Education for ongoing care 1 15 []  - Complex (extensive) Patient / Family Education for ongoing care 0 X - Staff obtains Programmer, systems, Records, Test Results / Process Orders 1 10 []  - Staff telephones HHA, Nursing Homes / Clarify orders / etc 0 []  - Routine Transfer to another Facility (non-emergent condition) 0 []  - Routine Hospital Admission (non-emergent condition) 0 X - New Admissions / Biomedical engineer / Ordering NPWT, Apligraf, etc. 1 15 []  - Emergency Hospital Admission (emergent condition) 0 PROCESS - Special Needs []  - Pediatric / Minor Patient Management 0 []  - Isolation Patient Management 0 Sobotta, Suzanne W. ([id number]) []  - Hearing / Language / Visual special needs 0 []  - Assessment of Community assistance (transportation, D/C planning, etc.) 0 []  - Additional assistance / Altered mentation 0 []  - Support Surface(s) Assessment (bed, cushion, seat, etc.) 0 INTERVENTIONS - Miscellaneous []  - External ear exam 0 []  - Patient Transfer  (multiple staff / Civil Service fast streamer / Similar devices) 0 []  - Simple Staple / Suture removal (25 or less) 0 []  - Complex Staple / Suture removal (26 or more) 0 []  - Hypo/Hyperglycemic Management (do not check if billed separately) 0 X - Ankle / Brachial Index (ABI) - do not check if billed separately 1 15  Has the patient been seen at the hospital within the last three years: Yes Total Score: 100 Level Of Care: New/Established - Level 3 Electronic Signature(s) Signed: 01/08/2015 4:46:23 PM By: Montey Hora Entered By: Montey Hora on 01/08/2015 16:46:22 Salehi, [name] ([id number]) -------------------------------------------------------------------------------- Encounter Discharge Information Details Patient Name: [name] Date of Service: 01/08/2015 3:15 PM Medical Record Number: [id number] Patient Account Number: [id number] Date of Birth/Sex: 20-Jan-1929 (81 y.o. Female) Treating RN: Primary Care Physician: Lorane Gell Other Clinician: Referring Physician: Lorane Gell Treating Physician/Extender: Frann Rider in Treatment: 0 Encounter Discharge Information Items Schedule Follow-up Appointment: No Medication Reconciliation completed No and provided to Patient/Care [name]: Provided on Clinical Summary of Care: 01/08/2015 Form Type Recipient Paper Patient NB Electronic Signature(s) Signed: 01/08/2015 4:30:24 PM By: Sharon Mt Entered By: Sharon Mt on 01/08/2015 16:30:23 Andal, [name] ([id number]) -------------------------------------------------------------------------------- Lower Extremity Assessment Details Patient Name: [name] Date of Service: 01/08/2015 3:15 PM Medical Record Number: [id number] Patient Account Number: [id number] Date of Birth/Sex: 10/19/1928 (81 y.o. Female) Treating RN: Junious Dresser Primary Care Physician: Lorane Gell Other Clinician: Referring Physician: Lorane Gell Treating Physician/Extender: Frann Rider in  Treatment: 0 Edema Assessment Assessed: [Left: Yes] [Right: Yes] Edema: [Left: Yes] [Right: Yes] Calf Left: Right: Point of Measurement: 31 cm From Medial Instep 33.3 cm 33 cm Ankle Left: Right: Point of Measurement: 12 cm From Medial Instep 19.9 cm 20.2 cm Vascular Assessment Claudication: Claudication Assessment [Left:None] [Right:None] Pulses: Posterior Tibial Palpable: [Left:Yes] [Right:Yes] Dorsalis Pedis Palpable: [Left:Yes] [Right:Yes] Extremity colors, hair growth, and conditions: Extremity Color: [Left:Hyperpigmented] [Right:Hyperpigmented] Hair Growth on Extremity: [Left:No] [Right:No] Temperature of Extremity: [Left:Cool] [Right:Cool] Capillary Refill: [Left:< 3 seconds] [Right:< 3 seconds] Dependent Rubor: [Left:No] [Right:No] Blanched when Elevated: [Left:No] [Right:No] Blood Pressure: Brachial: [Left:134] [Right:128] Dorsalis Pedis: 140 [Left:Dorsalis Pedis:] Ankle: Posterior Tibial: 180 [Left:Posterior Tibial: 106 1.34] [Right:0.79] Toe Nail Assessment Left: Right: Thick: Yes Yes Discolored: Yes Yes [name], Suzanne W. ([id number]) Deformed: No No Improper Length and Hygiene: No No Notes (R) DP- [place] Electronic Signature(s) Signed: 01/08/2015 4:49:26 PM By: Junious Dresser RN Entered By: Junious Dresser on 01/08/2015 15:36:49 Rutigliano, [name] ([id number]) -------------------------------------------------------------------------------- Multi Wound Chart Details Patient Name: [name] Date of Service: 01/08/2015 3:15 PM Medical Record Number: [id number] Patient Account Number: [id number] Date of Birth/Sex: [birth year] (81 y.o. Female) Treating RN: Montey Hora Primary Care Physician: Lorane Gell Other Clinician: Referring Physician: Lorane Gell Treating Physician/Extender: Frann Rider in Treatment: 0 Vital Signs Height(in): 65 Pulse(bpm): 61 Weight(lbs): 129.3 Blood Pressure 151/70 (mmHg): Body Mass Index(BMI): 22 Temperature(F):  98.4 Respiratory Rate 20 (breaths/min): Photos: [1:No Photos] [2:No Photos] [N/A:N/A] Wound Location: [1:Right Lower Leg - Anterior Right Forearm - Posterior] [N/A:N/A] Wounding Event: [1:Trauma] [2:Trauma] [N/A:N/A] Primary Etiology: [1:Trauma, Other] [2:Trauma, Other] [N/A:N/A] Date Acquired: [1:12/08/2014] [2:12/25/2014] [N/A:N/A] Weeks of Treatment: [1:0] [2:0] [N/A:N/A] Wound Status: [1:Open] [2:Open] [N/A:N/A] Measurements L x W x D 0.5x0.4x0.1 [2:1.3x6.5x0.1] [N/A:N/A] (cm) Area (cm) : [1:0.157] [2:6.637] [N/A:N/A] Volume (cm) : [1:0.016] [2:0.664] [N/A:N/A] % Reduction in Area: [1:0.00%] [2:0.00%] [N/A:N/A] % Reduction in Volume: 0.00% [2:0.00%] [N/A:N/A] Classification: [1:Full Thickness Without Exposed Support Structures] [2:Full Thickness Without Exposed Support Structures] [N/A:N/A] Exudate Amount: [1:None Present] [2:None Present] [N/A:N/A] Wound Margin: [1:Indistinct, nonvisible] [2:Indistinct, nonvisible] [N/A:N/A] Granulation Amount: [1:Small (1-33%)] [2:N/A] [N/A:N/A] Granulation Quality: [1:Red] [2:N/A] [N/A:N/A] Necrotic Amount: [1:Medium (34-66%)] [2:N/A] [N/A:N/A] Necrotic Tissue: [1:Eschar] [2:Eschar] [N/A:N/A] Exposed Structures: [1:Fascia: No Fat: No Tendon: No Muscle: No Joint: No Bone: No Limited to Skin Breakdown] [2:Fascia: No Fat: No Tendon: No  Muscle: No Joint: No Bone: No Limited to Skin Breakdown] [N/A:N/A] Epithelialization: Medium (34-66%) N/A N/A Periwound Skin Texture: Edema: No Edema: No N/A Excoriation: No Excoriation: No Induration: No Induration: No Callus: No Callus: No Crepitus: No Crepitus: No Fluctuance: No Fluctuance: No Friable: No Friable: No Rash: No Rash: No Scarring: No Scarring: No Periwound Skin Dry/Scaly: Yes Dry/Scaly: Yes N/A Moisture: Maceration: No Maceration: No Moist: No Moist: No Periwound Skin Color: Atrophie Blanche: No Ecchymosis: Yes N/A Cyanosis: No Atrophie Blanche: No Ecchymosis: No Cyanosis:  No Erythema: No Erythema: No Hemosiderin Staining: No Hemosiderin Staining: No Mottled: No Mottled: No Pallor: No Pallor: No Rubor: No Rubor: No Temperature: No Abnormality No Abnormality N/A Tenderness on No No N/A Palpation: Wound Preparation: Ulcer Cleansing: Ulcer Cleansing: N/A Rinsed/Irrigated with Rinsed/Irrigated with Saline Saline Topical Anesthetic Topical Anesthetic Applied: Other: Lidocaine Applied: Other: Lidocaine 4% Ointment 4% Ointment Treatment Notes Electronic Signature(s) Signed: 01/08/2015 5:11:42 PM By: Montey Hora Entered By: Montey Hora on 01/08/2015 16:10:38 Gutierrez, [name] ([id number]) -------------------------------------------------------------------------------- [place] Details Patient Name: [name] Date of Service: 01/08/2015 3:15 PM Medical Record Number: [id number] Patient Account Number: [id number] Date of Birth/Sex: 01-24-1929 (81 y.o. Female) Treating RN: Montey Hora Primary Care Physician: Lorane Gell Other Clinician: Referring Physician: Lorane Gell Treating Physician/Extender: Frann Rider in Treatment: 0 Active Inactive Abuse / Safety / Falls / Self Care Management Nursing Diagnoses: Potential for falls Goals: Patient will remain injury free Date Initiated: 01/08/2015 Goal Status: Active Interventions: Assess fall risk on admission and as needed Notes: Nutrition Nursing Diagnoses: Potential for alteratiion in Nutrition/Potential for imbalanced nutrition Goals: Patient/caregiver agrees to and verbalizes understanding of need to use nutritional supplements and/or vitamins as prescribed Date Initiated: 01/08/2015 Goal Status: Active Interventions: Assess patient nutrition upon admission and as needed per policy Notes: Orientation to the Wound Care Program Nursing Diagnoses: Knowledge deficit related to the wound healing center program Goals: Patient/caregiver will verbalize  understanding of the [place], [place]. ([id number]) Date Initiated: 01/08/2015 Goal Status: Active Interventions: Provide education on orientation to the wound center Notes: Wound/Skin Impairment Nursing Diagnoses: Impaired tissue integrity Goals: Ulcer/skin breakdown will have a volume reduction of 30% by week 4 Date Initiated: 01/08/2015 Goal Status: Active Interventions: Assess ulceration(s) every visit Notes: Electronic Signature(s) Signed: 01/08/2015 5:11:42 PM By: Montey Hora Entered By: Montey Hora on 01/08/2015 16:10:23 Saavedra, [name] ([id number]) -------------------------------------------------------------------------------- Pain Assessment Details Patient Name: [name] Date of Service: 01/08/2015 3:15 PM Medical Record Number: [id number] Patient Account Number: [id number] Date of Birth/Sex: [birth year] (81 y.o. Female) Treating RN: Junious Dresser Primary Care Physician: Lorane Gell Other Clinician: Referring Physician: Lorane Gell Treating Physician/Extender: Frann Rider in Treatment: 0 Active Problems Location of Pain Severity and Description of Pain Patient Has Paino Yes Site Locations Pain Location: Pain in Ulcers With Dressing Change: No Duration of the Pain. Constant / Intermittento Intermittent Rate the pain. Current Pain Level: 0 Worst Pain Level: 4 Least Pain Level: 0 Character of Pain Describe the Pain: Aching, Sharp Pain Management and Medication Current Pain Management: Electronic Signature(s) Signed: 01/08/2015 4:49:26 PM By: Junious Dresser RN Entered By: Junious Dresser on 01/08/2015 15:06:35 Fulbright, [name] ([id number]) -------------------------------------------------------------------------------- Patient/Caregiver Education Details Patient Name: [name] Date of Service: 01/08/2015 3:15 PM Medical Record Number: [id number] Patient Account Number: [id number] Date of Birth/Gender:  04-23-1929 (81 y.o. Female) Treating RN: Montey Hora Primary Care Physician: Lorane Gell Other Clinician: Referring Physician: Lorane Gell Treating Physician/Extender: Christin Fudge  Weeks in Treatment: 0 Education Assessment Education Provided To: Patient Education Topics Provided Wound/Skin Impairment: Handouts: Other: wound care as ordered Methods: Demonstration, Explain/Verbal Responses: State content correctly Electronic Signature(s) Signed: 01/08/2015 5:11:42 PM By: Montey Hora Entered By: Montey Hora on 01/08/2015 16:12:03 Gubbels, [name] ([id number]) -------------------------------------------------------------------------------- Wound Assessment Details Patient Name: [name] Date of Service: 01/08/2015 3:15 PM Medical Record Number: [id number] Patient Account Number: [id number] Date of Birth/Sex: 1929-04-11 (81 y.o. Female) Treating RN: Junious Dresser Primary Care Physician: Lorane Gell Other Clinician: Referring Physician: Lorane Gell Treating Physician/Extender: Frann Rider in Treatment: 0 Wound Status Wound Number: 1 Primary Etiology: Trauma, Other Wound Location: Right Lower Leg - Anterior Wound Status: Open Wounding Event: Trauma Date Acquired: 12/08/2014 Weeks Of Treatment: 0 Clustered Wound: No Photos Photo Uploaded By: Junious Dresser on 01/08/2015 16:42:02 Wound Measurements Length: (cm) 0.5 Width: (cm) 0.4 Depth: (cm) 0.1 Area: (cm) 0.157 Volume: (cm) 0.016 % Reduction in Area: 0% % Reduction in Volume: 0% Epithelialization: Medium (34-66%) Tunneling: No Undermining: No Wound Description Full Thickness Without Exposed Classification: Support Structures Wound Margin: Indistinct, nonvisible Exudate None Present Amount: Foul Odor After Cleansing: No Wound Bed Granulation Amount: Small (1-33%) Exposed Structure Granulation Quality: Red Fascia Exposed: No Necrotic Amount: Medium (34-66%) Fat Layer Exposed:  No Necrotic Quality: Eschar Tendon Exposed: No Muscle Exposed: No Sprunger, Suzanne W. ([id number]) Joint Exposed: No Bone Exposed: No Limited to Skin Breakdown Periwound Skin Texture Texture Color No Abnormalities Noted: No No Abnormalities Noted: No Callus: No Atrophie Blanche: No Crepitus: No Cyanosis: No Excoriation: No Ecchymosis: No Fluctuance: No Erythema: No Friable: No Hemosiderin Staining: No Induration: No Mottled: No Localized Edema: No Pallor: No Rash: No Rubor: No Scarring: No Temperature / Pain Moisture Temperature: No Abnormality No Abnormalities Noted: No Dry / Scaly: Yes Maceration: No Moist: No Wound Preparation Ulcer Cleansing: Rinsed/Irrigated with Saline Topical Anesthetic Applied: Other: Lidocaine 4% Ointment , Treatment Notes Wound #1 (Right, Anterior Lower Leg) 1. Cleansed with: Clean wound with Normal Saline 2. Anesthetic Topical Lidocaine 4% cream to wound bed prior to debridement 4. Dressing Applied: Prisma Ag 5. Secondary Dressing Applied Bordered Foam Dressing Electronic Signature(s) Signed: 01/08/2015 4:49:26 PM By: Junious Dresser RN Entered By: Junious Dresser on 01/08/2015 15:21:46 Costen, [name] ([id number]) -------------------------------------------------------------------------------- Wound Assessment Details Patient Name: [name] Date of Service: 01/08/2015 3:15 PM Medical Record Number: [id number] Patient Account Number: [id number] Date of Birth/Sex: November 10, 1928 (81 y.o. Female) Treating RN: Junious Dresser Primary Care Physician: Lorane Gell Other Clinician: Referring Physician: Lorane Gell Treating Physician/Extender: Frann Rider in Treatment: 0 Wound Status Wound Number: 2 Primary Etiology: Trauma, Other Wound Location: Right Forearm - Posterior Wound Status: Open Wounding Event: Trauma Date Acquired: 12/25/2014 Weeks Of Treatment: 0 Clustered Wound: No Photos Photo Uploaded By: Junious Dresser on  01/08/2015 16:42:03 Wound Measurements Length: (cm) 1.3 Width: (cm) 6.5 Depth: (cm) 0.1 Area: (cm) 6.637 Volume: (cm) 0.664 % Reduction in Area: 0% % Reduction in Volume: 0% Wound Description Full Thickness Without Exposed Classification: Support Structures Wound Margin: Indistinct, nonvisible Exudate None Present Amount: [place], Suzanne W. ([id number]) Foul Odor After Cleansing: No Wound Bed Necrotic Amount: Large (67-100%) Exposed Structure Necrotic Quality: Eschar Fascia Exposed: No Fat Layer Exposed: No Tendon Exposed: No Muscle Exposed: No Joint Exposed: No Bone Exposed: No Limited to Skin Breakdown Periwound Skin Texture Texture Color No Abnormalities Noted: No No Abnormalities Noted: No Callus: No Atrophie Blanche: No Crepitus: No Cyanosis: No Excoriation: No Ecchymosis: Yes Fluctuance: No Erythema: No  Friable: No Hemosiderin Staining: No Induration: No Mottled: No Localized Edema: No Pallor: No Rash: No Rubor: No Scarring: No Temperature / Pain Moisture Temperature: No Abnormality No Abnormalities Noted: No Dry / Scaly: Yes Maceration: No Moist: No Wound Preparation Ulcer Cleansing: Rinsed/Irrigated with Saline Topical Anesthetic Applied: Other: Lidocaine 4% Ointment , Treatment Notes Wound #2 (Right, Posterior Forearm) 1. Cleansed with: Clean wound with Normal Saline 2. Anesthetic Topical Lidocaine 4% cream to wound bed prior to debridement 3. Peri-wound Care: Skin Prep 4. Dressing Applied: Mepitel 5. Secondary Dressing Applied Dry Gauze Tegaderm Suzanne, Kelly ([id number]) Electronic Signature(s) Signed: 01/08/2015 4:49:26 PM By: Junious Dresser RN Entered By: Junious Dresser on 01/08/2015 15:24:57 Pan[place], [name] ([id number]) -------------------------------------------------------------------------------- Vitals Details Patient Name: [name] Date of Service: 01/08/2015 3:15 PM Medical Record Number: [id number] Patient  Account Number: [id number] Date of Birth/Sex: [birth year] (81 y.o. Female) Treating RN: Junious Dresser Primary Care Physician: Lorane Gell Other Clinician: Referring Physician: Lorane Gell Treating Physician/Extender: Frann Rider in Treatment: 0 Vital Signs Time Taken: 15:00 Temperature (F): 98.4 Height (in): 65 Pulse (bpm): 61 Source: Stated Respiratory Rate (breaths/min): 20 Weight (lbs): 129.3 Blood Pressure (mmHg): 151/70 Source: Measured Reference Range: 80 - 120 mg / dl Body Mass Index (BMI): 21.5 Electronic Signature(s) Signed: 01/08/2015 4:49:26 PM By: Junious Dresser RN Entered By: Junious Dresser on 01/08/2015 15:08:29

## 2015-01-09 NOTE — Progress Notes (Signed)
Pre visit review using our clinic review tool, if applicable. No additional management support is needed unless otherwise documented below in the visit note. 

## 2015-01-09 NOTE — Progress Notes (Signed)
SHAUNTAY, BRUNELLI ([id number]) Visit Report for 01/08/2015 Chief Complaint Document Details Patient Name: [name], [name] Date of Service: 01/08/2015 3:15 PM Medical Record Number: [id number] Patient Account Number: [id number] Date of Birth/Sex: 1929/04/09 (81 y.o. Female) Treating RN: Primary Care Physician: Lorane Gell Other Clinician: Referring Physician: Lorane Gell Treating Physician/Extender: Frann Rider in Treatment: 0 Information Obtained from: Patient Chief Complaint Patient presents to the wound care center for a consult due non healing wound. pleasant 81 year old patient injured her left lower extremity against the dishwasher door about 6 weeks ago and then had a separate injury to her right forearm about 2 weeks ago. Electronic Signature(s) Signed: 01/08/2015 4:42:08 PM By: Christin Fudge MD, FACS Entered By: Christin Fudge on 01/08/2015 16:34:06 [name], [name] ([id number]) -------------------------------------------------------------------------------- Debridement Details Patient Name: [name] Date of Service: 01/08/2015 3:15 PM Medical Record Number: [id number] Patient Account Number: [id number] Date of Birth/Sex: 11/11/1928 (81 y.o. Female) Treating RN: Primary Care Physician: Lorane Gell Other Clinician: Referring Physician: Lorane Gell Treating Physician/Extender: Frann Rider in Treatment: 0 Debridement Performed for Wound #2 Right,Posterior Forearm Assessment: Performed By: Physician Pat Patrick., MD Debridement: Open Wound/Selective Debridement Selective Description: Pre-procedure Yes Verification/Time Out Taken: Start Time: 16:13 Pain Control: Lidocaine 4% Topical Solution Level: Non-Viable Tissue Total Area Debrided (L x 1.3 (cm) x 6.5 (cm) = 8.45 (cm) W): Tissue and other Non-Viable, Eschar, Exudate, Skin material debrided: Instrument: Forceps, Other : gauze and saline Bleeding: Minimum End Time: 16:18 Procedural  Pain: 0 Post Procedural Pain: 0 Response to Treatment: Procedure was tolerated well Post Debridement Measurements of Total Wound Length: (cm) 1.3 Width: (cm) 6.5 Depth: (cm) 0.1 Volume: (cm) 0.664 Electronic Signature(s) Signed: 01/08/2015 4:42:08 PM By: Christin Fudge MD, FACS Entered By: Christin Fudge on 01/08/2015 16:33:31 [name], [name] ([id number]) -------------------------------------------------------------------------------- HPI Details Patient Name: [name] Date of Service: 01/08/2015 3:15 PM Medical Record Number: [id number] Patient Account Number: [id number] Date of Birth/Sex: 1929-07-10 (81 y.o. Female) Treating RN: Primary Care Physician: Lorane Gell Other Clinician: Referring Physician: Lorane Gell Treating Physician/Extender: Frann Rider in Treatment: 0 History of Present Illness Location: left lower extremity and right forearm Quality: Patient reports experiencing a dull pain to affected area(s). Severity: Patient states wound (s) are getting better. Duration: Patient states the wound has been present for 6 and 2 weeks respectively Context: The wound occurred when the patient injured her leg on a dishwasher door and on the car door for her right forearm. Modifying Factors: Consults to this date include:urgent care for a right forearm 2 weeks ago when there was glue and Steri-Strips applied. HPI Description: Pleasant 81 year old who came with a history of hitting her left leg on a dishwasher about 6 weeks ago. She had been initially applying Neosporin and some other local care and had not gotten well and hence was referred to the wound care center by her PCP. About 10 days ago she also skinned her right forearm when she was getting out of the car and does some skin off. She was seen at the urgent care and Steri-Strips were applied and was given some instructions for wound care. Past Medical Historyosignificant JJK:[id number];;BZJI (gastroesophageal  reflux disease);Hypertension;Chronic kidney diseaseo;Vasculitis, ANCA positive, pulmonary hemorrhage;Thyroid disease;hyperthyroid, had radioactiveiodine treatment;Renal cell carcinoma 2003 s/p sgy for removal right kidney;Hemorrhoids.o he has also had several surgeries including abdominal hysterectomy with bilateral salpingo-oophorectomy, hernia repair, fracture of her femur on the right side, cholecystectomy, cardiac catheterization. she has been a nonsmoker all her life. Electronic Signature(s)  Signed: 01/08/2015 4:42:08 PM By: Christin Fudge MD, FACS Entered By: Christin Fudge on 01/08/2015 16:36:59 [name], [name] ([id number]) -------------------------------------------------------------------------------- Physical Exam Details Patient Name: [name] Date of Service: 01/08/2015 3:15 PM Medical Record Number: [id number] Patient Account Number: [id number] Date of Birth/Sex: Dec 27, 1928 (81 y.o. Female) Treating RN: Primary Care Physician: Lorane Gell Other Clinician: Referring Physician: Lorane Gell Treating Physician/Extender: Frann Rider in Treatment: 0 Constitutional . Pulse regular. Respirations normal and unlabored. Afebrile. . Eyes Nonicteric. Reactive to light. Ears, Nose, Mouth, and Throat Lips, teeth, and gums WNL.Marland Kitchen Moist mucosa without lesions . Neck supple and nontender. No palpable supraclavicular or cervical adenopathy. Normal sized without goiter. Respiratory WNL. No retractions.. Cardiovascular Pedal Pulses WNL. ABI on the left 1.34 on the right 0.79.Marland Kitchen No clubbing, cyanosis or edema. she has some ecchymosis on the right lower extremity.. Gastrointestinal (GI) Abdomen without masses or tenderness.. No liver or spleen enlargement or tenderness.. Musculoskeletal Adexa without tenderness or enlargement.. Digits and nails w/o clubbing, cyanosis, infection, petechiae, ischemia, or inflammatory conditions.. Integumentary (Hair, Skin) Small ulcerated area  on the left lower extremity with some ecchymosis around it but otherwise looking fairly clean.. No crepitus or fluctuance. No peri-wound warmth or erythema. No masses.Marland Kitchen Psychiatric Judgement and insight Intact.. No evidence of depression, anxiety, or agitation.. Electronic Signature(s) Signed: 01/08/2015 4:42:08 PM By: Christin Fudge MD, FACS Entered By: Christin Fudge on 01/08/2015 16:38:39 Chaplin, [name] ([id number]) -------------------------------------------------------------------------------- Physician Orders Details Patient Name: [name] Date of Service: 01/08/2015 3:15 PM Medical Record Number: [id number] Patient Account Number: [id number] Date of Birth/Sex: June 13, 1929 (81 y.o. Female) Treating RN: Montey Hora Primary Care Physician: Lorane Gell Other Clinician: Referring Physician: Lorane Gell Treating Physician/Extender: Frann Rider in Treatment: 0 Verbal / Phone Orders: Yes Clinician: Montey Hora Read Back and Verified: Yes Diagnosis Coding ICD-10 Coding Code Description [phone] Laceration without foreign body, left lower leg, initial encounter S41.111A Laceration without foreign body of right upper arm, initial encounter Wound Cleansing Wound #1 Right,Anterior Lower Leg o Cleanse wound with mild soap and water o May Shower, gently pat wound dry prior to applying new dressing. Wound #2 Right,Posterior Forearm o Cleanse wound with mild soap and water o May Shower, gently pat wound dry prior to applying new dressing. Anesthetic Wound #1 Right,Anterior Lower Leg o Topical Lidocaine 4% cream applied to wound bed prior to debridement Wound #2 Right,Posterior Forearm o Topical Lidocaine 4% cream applied to wound bed prior to debridement Primary Wound Dressing Wound #1 Right,Anterior Lower Leg o Prisma Ag Wound #2 Right,Posterior Forearm o Dry Gauze o Other: - mepitel one Secondary Dressing Wound #1 Right,Anterior Lower  Leg o Boardered Foam Dressing Wound #2 Right,Posterior Forearm o Tegaderm Alessio, Suzanne W. ([id number]) Dressing Change Frequency Wound #1 Right,Anterior Lower Leg o Change dressing every other day. Follow-up Appointments Wound #1 Right,Anterior Lower Leg o Return Appointment in 1 week. Wound #2 Right,Posterior Forearm o Return Appointment in 1 week. Electronic Signature(s) Signed: 01/08/2015 4:42:08 PM By: Christin Fudge MD, FACS Signed: 01/08/2015 5:11:42 PM By: Montey Hora Entered By: Montey Hora on 01/08/2015 16:22:13 Sakai, [name] ([id number]) -------------------------------------------------------------------------------- Problem List Details Patient Name: [name] Date of Service: 01/08/2015 3:15 PM Medical Record Number: [id number] Patient Account Number: [id number] Date of Birth/Sex: [birth year] (81 y.o. Female) Treating RN: Primary Care Physician: Lorane Gell Other Clinician: Referring Physician: Lorane Gell Treating Physician/Extender: Frann Rider in Treatment: 0 Active Problems ICD-10 Encounter Code Description Active Date Diagnosis S81.812A Laceration without foreign body, left  lower leg, initial 01/08/2015 Yes encounter S41.111A Laceration without foreign body of right upper arm, initial 01/08/2015 Yes encounter Inactive Problems Resolved Problems Electronic Signature(s) Signed: 01/08/2015 4:42:08 PM By: Christin Fudge MD, FACS Entered By: Christin Fudge on 01/08/2015 16:32:53 Deford, [name] ([id number]) -------------------------------------------------------------------------------- Progress Note Details Patient Name: [name] Date of Service: 01/08/2015 3:15 PM Medical Record Number: [id number] Patient Account Number: [id number] Date of Birth/Sex: 01/27/1929 (81 y.o. Female) Treating RN: Primary Care Physician: Lorane Gell Other Clinician: Referring Physician: Lorane Gell Treating Physician/Extender: Frann Rider in Treatment: 0 Subjective Chief Complaint Information obtained from Patient Patient presents to the wound care center for a consult due non healing wound. pleasant 81 year old patient injured her left lower extremity against the dishwasher door about 6 weeks ago and then had a separate injury to her right forearm about 2 weeks ago. History of Present Illness (HPI) The following HPI elements were documented for the patient's wound: Location: left lower extremity and right forearm Quality: Patient reports experiencing a dull pain to affected area(s). Severity: Patient states wound (s) are getting better. Duration: Patient states the wound has been present for 6 and 2 weeks respectively Context: The wound occurred when the patient injured her leg on a dishwasher door and on the car door for her right forearm. Modifying Factors: Consults to this date include:urgent care for a right forearm 2 weeks ago when there was glue and Steri-Strips applied. Pleasant 81 year old who came with a history of hitting her left leg on a dishwasher about 6 weeks ago. She had been initially applying Neosporin and some other local care and had not gotten well and hence was referred to the wound care center by her PCP. About 10 days ago she also skinned her right forearm when she was getting out of the car and does some skin off. She was seen at the urgent care and Steri- Strips were applied and was given some instructions for wound care. Past Medical History significant XHB:[id number];;YBOF (gastroesophageal reflux disease);Hypertension;Chronic kidney disease ;Vasculitis, ANCA positive, pulmonary hemorrhage;Thyroid disease;hyperthyroid, had radioactiveiodine treatment;Renal cell carcinoma 2003 s/p sgy for removal right kidney;Hemorrhoids. he has also had several surgeries including abdominal hysterectomy with bilateral salpingo-oophorectomy, hernia repair, fracture of her femur on the right side,  cholecystectomy, cardiac catheterization. she has been a nonsmoker all her life. Wound History Patient presents with 2 open wounds that have been present for approximately 1 months. Patient has been treating wounds in the following manner: no dressing; neosporin. Laboratory tests have not been performed in the last month. Patient reportedly has not tested positive for an antibiotic resistant organism. Patient Hemminger, [name]. ([id number]) reportedly has not tested positive for osteomyelitis. Patient reportedly has had testing performed to evaluate circulation in the legs. Patient experiences the following problems associated with their wounds: swelling. Patient History Information obtained from Patient. Allergies amoxicillin (Reaction: itching), NSAIDS (Non-Steroidal Anti-Inflammatory Drug), tolmetin Family History Heart Disease - Father, No family history of Cancer, Diabetes, Hereditary Spherocytosis, Hypertension, Kidney Disease, Lung Disease, Seizures, Stroke, Thyroid Problems, Tuberculosis. Social History Never smoker, Marital Status - Widowed, Alcohol Use - Never, Drug Use - No History, Caffeine Use - Daily. Medical History Eyes Patient has history of Cataracts - removed (B) Denies history of Glaucoma, Optic Neuritis Ear/Nose/Mouth/Throat Patient has history of Chronic sinus problems/congestion Hematologic/Lymphatic Denies history of Anemia, Hemophilia, Human Immunodeficiency Virus, Lymphedema, Sickle Cell Disease Respiratory Denies history of Aspiration, Asthma, Chronic Obstructive Pulmonary Disease (COPD), Pneumothorax, Sleep Apnea, Tuberculosis Cardiovascular Patient has history of  Angina, Hypertension, Vasculitis Denies history of Arrhythmia, Congestive Heart Failure, Coronary Artery Disease, Deep Vein Thrombosis, Hypotension, Myocardial Infarction, Peripheral Arterial Disease, Peripheral Venous Disease, Phlebitis Gastrointestinal Denies history of Cirrhosis , Colitis,  Crohn s, Hepatitis A, Hepatitis B, Hepatitis C Endocrine Denies history of Type I Diabetes, Type II Diabetes Integumentary (Skin) Denies history of History of Burn, History of pressure wounds Musculoskeletal Patient has history of Osteoarthritis Denies history of Gout, Rheumatoid Arthritis, Osteomyelitis Neurologic Denies history of Dementia, Neuropathy, Quadriplegia, Paraplegia, Seizure Disorder Oncologic Denies history of Received Chemotherapy, Received Radiation Psychiatric Denies history of Anorexia/bulimia, Confinement Anxiety Hospitalization/Surgery History - 08/04/1998, [facility], Partial (L) kidney removal- [place]. Macaluso, Suzanne W. ([id number]) Medical And Surgical History Notes Gastrointestinal hiatal hernia; chronic constipation Genitourinary Kidney [place]- (L) partial removal Immunological compromised immune system Oncologic Kidney CA (L) Review of Systems (ROS) Constitutional Symptoms (General Health) Complains or has symptoms of Marked Weight Change - lost 34 lbs within 9 months- PCP aware. Eyes Complains or has symptoms of Glasses / Contacts. Denies complaints or symptoms of Dry Eyes, Vision Changes. Ear/Nose/Mouth/Throat The patient has no complaints or symptoms. Hematologic/Lymphatic The patient has no complaints or symptoms. Respiratory The patient has no complaints or symptoms. Cardiovascular Complains or has symptoms of LE edema. Gastrointestinal The patient has no complaints or symptoms. Endocrine The patient has no complaints or symptoms. Genitourinary The patient has no complaints or symptoms. Immunological The patient has no complaints or symptoms. Integumentary (Skin) Complains or has symptoms of Wounds, Bleeding or bruising tendency, Swelling. Denies complaints or symptoms of Breakdown. Musculoskeletal The patient has no complaints or symptoms. Neurologic Complains or has symptoms of Numbness/parasthesias - fingers. Psychiatric The patient has no  complaints or symptoms. Medications ambatrose oral tablet oral cranberry oral unspecified oral fish oil oral capsule oral lisinopril 10 mg-hydrochlorothiazide 12.5 mg tablet oral tablet oral Dias, Lener W. ([id number]) aspirin 81 mg tablet,delayed release oral tablet,delayed release (DR/EC) oral Adult Probiotic 3 billion cell capsule oral capsule oral metoprolol tartrate 25 mg tablet oral tablet oral Coral Calcium 390 mg (1,000 mg) tablet oral tablet oral prednisone 5 mg tablet oral tablet oral turmeric root extract 500 mg capsule oral capsule oral ferrous sulfate 325 mg (65 mg iron) tablet oral tablet oral Benefiber Clear Sugar Free(dextrin) 3 gram/3.5 gram oral powder packet oral powder in packet oral Miralax 17 gram oral powder packet oral powder in packet oral Centrum Silver Women 8 mg iron-400 mcg-300 mcg tablet oral tablet oral Hair Vitamins tablet oral tablet oral Vitamin C 500 mg tablet oral tablet oral cholecalciferol (vitamin D3) 1,000 unit tablet oral tablet oral Objective Constitutional Pulse regular. Respirations normal and unlabored. Afebrile. Vitals Time Taken: 3:00 PM, Height: 65 in, Source: Stated, Weight: 129.3 lbs, Source: Measured, BMI: 21.5, Temperature: 98.4 F, Pulse: 61 bpm, Respiratory Rate: 20 breaths/min, Blood Pressure: 151/70 mmHg. Eyes Nonicteric. Reactive to light. Ears, Nose, Mouth, and Throat Lips, teeth, and gums WNL.Marland Kitchen Moist mucosa without lesions . Neck supple and nontender. No palpable supraclavicular or cervical adenopathy. Normal sized without goiter. Respiratory WNL. No retractions.. Cardiovascular Pedal Pulses WNL. ABI on the left 1.34 on the right 0.79.Marland Kitchen No clubbing, cyanosis or edema. she has some ecchymosis on the right lower extremity.. Gastrointestinal (GI) Abdomen without masses or tenderness.. No liver or spleen enlargement or tenderness.. Musculoskeletal Adexa without tenderness or enlargement.. Digits and nails w/o clubbing,  cyanosis, infection, petechiae, ischemia, or inflammatory conditions.[name] ([id number]) Psychiatric Judgement and insight Intact.. No evidence of depression, anxiety, or agitation.. Integumentary (Hair,  Skin) Small ulcerated area on the left lower extremity with some ecchymosis around it but otherwise looking fairly clean.. No crepitus or fluctuance. No peri-wound warmth or erythema. No masses.. Wound #1 status is Open. Original cause of wound was Trauma. The wound is located on the Right,Anterior Lower Leg. The wound measures 0.5cm length x 0.4cm width x 0.1cm depth; 0.157cm^2 area and 0.016cm^3 volume. The wound is limited to skin breakdown. There is no tunneling or undermining noted. There is a none present amount of drainage noted. The wound margin is indistinct and nonvisible. There is small (1-33%) red granulation within the wound bed. There is a medium (34-66%) amount of necrotic tissue within the wound bed including Eschar. The periwound skin appearance exhibited: Dry/Scaly. The periwound skin appearance did not exhibit: Callus, Crepitus, Excoriation, Fluctuance, Friable, Induration, Localized Edema, Rash, Scarring, Maceration, Moist, Atrophie Blanche, Cyanosis, Ecchymosis, Hemosiderin Staining, Mottled, Pallor, Rubor, Erythema. Periwound temperature was noted as No Abnormality. Wound #2 status is Open. Original cause of wound was Trauma. The wound is located on the Right,Posterior Forearm. The wound measures 1.3cm length x 6.5cm width x 0.1cm depth; 6.637cm^2 area and 0.664cm^3 volume. The wound is limited to skin breakdown. There is a none present amount of drainage noted. The wound margin is indistinct and nonvisible. There is a large (67-100%) amount of necrotic tissue within the wound bed including Eschar. The periwound skin appearance exhibited: Dry/Scaly, Ecchymosis. The periwound skin appearance did not exhibit: Callus, Crepitus, Excoriation, Fluctuance, Friable,  Induration, Localized Edema, Rash, Scarring, Maceration, Moist, Atrophie Blanche, Cyanosis, Hemosiderin Staining, Mottled, Pallor, Rubor, Erythema. Periwound temperature was noted as No Abnormality. Small ulcerated area on the left lower extremity with some ecchymosis around it but otherwise looking fairly clean. The right forearm has a lot of contrast and ecchymosis under the Steri-Strips. Assessment Active Problems ICD-10 S81.812A - Laceration without foreign body, left lower leg, initial encounter S41.111A - Laceration without foreign body of right upper arm, initial encounter Orrick, Tacha W. ([id number]) The left lower extremity is fairly clean and I would use some Prisma AG and bordered foam on alternate days. the right forearm after I was able to remove the Steri-Strips in all the dried eschar and bled the wound looks fairly good with minimal ecchymosis and there's not much area open but I'm not sure if all the skin has taken. Would use a piece of Mepitel some gauze over this and then a Tegaderm dressing. Does not need to change this still next week. Procedures Wound #2 Wound #2 is a Trauma, Other located on the Right,Posterior Forearm . There was a Non-Viable Tissue Open Wound/Selective [phone]) debridement with total area of 8.45 sq cm performed by Erasmo Vertz, Jackson Latino., MD. with the following instrument(s): Forceps and gauze and saline to remove Non-Viable tissue/material including Exudate, Eschar, and Skin after achieving pain control using Lidocaine 4% Topical Solution. A time out was conducted prior to the start of the procedure. There was a Minimum amount of bleeding. The procedure was tolerated well with a pain level of 0 throughout and a pain level of 0 following the procedure. Post Debridement Measurements: 1.3cm length x 6.5cm width x 0.1cm depth; 0.664cm^3 volume. Plan Wound Cleansing: Wound #1 Right,Anterior Lower Leg: Cleanse wound with mild soap and water May  Shower, gently pat wound dry prior to applying new dressing. Wound #2 Right,Posterior Forearm: Cleanse wound with mild soap and water May Shower, gently pat wound dry prior to applying new dressing. Anesthetic: Wound #1 Right,Anterior Lower Leg: Topical  Lidocaine 4% cream applied to wound bed prior to debridement Wound #2 Right,Posterior Forearm: Topical Lidocaine 4% cream applied to wound bed prior to debridement Primary Wound Dressing: Wound #1 Right,Anterior Lower Leg: Prisma Ag Wound #2 Right,Posterior Forearm: Dry Gauze Other: - mepitel one Secondary Dressing: Wound #1 Right,Anterior Lower Leg: Brosious, Suzanne W. ([id number]) Boardered Foam Dressing Wound #2 Right,Posterior Forearm: Tegaderm Dressing Change Frequency: Wound #1 Right,Anterior Lower Leg: Change dressing every other day. Follow-up Appointments: Wound #1 Right,Anterior Lower Leg: Return Appointment in 1 week. Wound #2 Right,Posterior Forearm: Return Appointment in 1 week. The left lower extremity is fairly clean and I would use some Prisma AG and bordered foam on alternate days. the right forearm after I was able to remove the Steri-Strips in all the dried eschar and bled the wound looks fairly good with minimal ecchymosis and there's not much area open but I'm not sure if all the skin has taken. Would use a piece of Mepitel some gauze over this and then a Tegaderm dressing. Does not need to change this still next week. I have answered all her questions and she will come back to see me next week. Electronic Signature(s) Signed: 01/08/2015 4:42:08 PM By: Christin Fudge MD, FACS Entered By: Christin Fudge on 01/08/2015 16:40:35 Smedberg, [name] ([id number]) -------------------------------------------------------------------------------- ROS/PFSH Details Patient Name: [name] Date of Service: 01/08/2015 3:15 PM Medical Record Number: [id number] Patient Account Number: [id number] Date of Birth/Sex:  12-08-1928 (81 y.o. Female) Treating RN: Primary Care Physician: Lorane Gell Other Clinician: Referring Physician: Lorane Gell Treating Physician/Extender: Frann Rider in Treatment: 0 Information Obtained From Patient Wound History Do you currently have one or more open woundso Yes How many open wounds do you currently haveo 2 Approximately how long have you had your woundso 1 months How have you been treating your wound(s) until nowo no dressing; neosporin Has your wound(s) ever healed and then re-openedo No Have you had any lab work done in the past montho No Have you tested positive for an antibiotic resistant organism (MRSA, VRE)o No Have you tested positive for osteomyelitis (bone infection)o No Have you had any tests for circulation on your legso Yes Who ordered the testo PCP Where was the test doneo [facility] Have you had other problems associated with your woundso Swelling Constitutional Symptoms (General Health) Complaints and Symptoms: Positive for: Marked Weight Change - lost 34 lbs within 9 months- PCP aware Eyes Complaints and Symptoms: Positive for: Glasses / Contacts Negative for: Dry Eyes; Vision Changes Medical History: Positive for: Cataracts - removed (B) Negative for: Glaucoma; Optic Neuritis Cardiovascular Complaints and Symptoms: Positive for: LE edema Medical History: Positive for: Angina; Hypertension; Vasculitis Negative for: Arrhythmia; Congestive Heart Failure; Coronary Artery Disease; Deep Vein Thrombosis; Hypotension; Myocardial Infarction; Peripheral Arterial Disease; Peripheral Venous Disease; Phlebitis Nolet, Suzanne W. ([id number]) Integumentary (Skin) Complaints and Symptoms: Positive for: Wounds; Bleeding or bruising tendency; Swelling Negative for: Breakdown Medical History: Negative for: History of Burn; History of pressure wounds Neurologic Complaints and Symptoms: Positive for: Numbness/parasthesias - fingers Medical  History: Negative for: Dementia; Neuropathy; Quadriplegia; Paraplegia; Seizure Disorder Ear/Nose/Mouth/Throat Complaints and Symptoms: No Complaints or Symptoms Medical History: Positive for: Chronic sinus problems/congestion Hematologic/Lymphatic Complaints and Symptoms: No Complaints or Symptoms Medical History: Negative for: Anemia; Hemophilia; Human Immunodeficiency Virus; Lymphedema; Sickle Cell Disease Respiratory Complaints and Symptoms: No Complaints or Symptoms Medical History: Negative for: Aspiration; Asthma; Chronic Obstructive Pulmonary Disease (COPD); Pneumothorax; Sleep Apnea; Tuberculosis Gastrointestinal Complaints and Symptoms: No Complaints or Symptoms Medical History:  Negative for: Cirrhosis ; Colitis; Crohnos; Hepatitis A; Hepatitis B; Hepatitis C Past Medical History Notes: hiatal hernia; chronic constipation Amador, Suzanne W. ([id number]) Endocrine Complaints and Symptoms: No Complaints or Symptoms Medical History: Negative for: Type I Diabetes; Type II Diabetes Genitourinary Complaints and Symptoms: No Complaints or Symptoms Medical History: Past Medical History Notes: Kidney CA- (L) partial removal Immunological Complaints and Symptoms: No Complaints or Symptoms Medical History: Past Medical History Notes: compromised immune system Musculoskeletal Complaints and Symptoms: No Complaints or Symptoms Medical History: Positive for: Osteoarthritis Negative for: Gout; Rheumatoid Arthritis; Osteomyelitis Oncologic Medical History: Negative for: Received Chemotherapy; Received Radiation Past Medical History Notes: Kidney CA (L) Psychiatric Complaints and Symptoms: No Complaints or Symptoms Medical History: Negative for: Anorexia/bulimia; Confinement Anxiety HBO Extended History Items Quintanar, Suzanne W. ([id number]) Eyes: Ear/Nose/Mouth/Throat: Cataracts Chronic sinus problems/congestion Immunizations Tetanus Vaccine: Last tetanus shot:  12/25/2014 Hospitalization / Surgery History Name of Hospital Purpose of Hospitalization/Surgery Date [facility] Partial (L) kidney removal- [place] 08/04/1998 Family and Social History Cancer: No; Diabetes: No; Heart Disease: Yes - Father; Hereditary Spherocytosis: No; Hypertension: No; Kidney Disease: No; Lung Disease: No; Seizures: No; Stroke: No; Thyroid Problems: No; Tuberculosis: No; Never smoker; Marital Status - Widowed; Alcohol Use: Never; Drug Use: No History; Caffeine Use: Daily; Advanced Directives: Yes (Not Provided); Patient does not want information on Advanced Directives; Do not resuscitate: No; Living Will: Yes (Not Provided); Medical Power of Attorney: Yes - daughter- Garry Heater (Not Provided) Physician Affirmation I have reviewed and agree with the above information. Electronic Signature(s) Signed: 01/08/2015 4:42:08 PM By: Christin Fudge MD, FACS Entered By: Christin Fudge on 01/08/2015 15:52:42 Stemmler, [name] ([id number]) -------------------------------------------------------------------------------- SuperBill Details Patient Name: [name] Date of Service: 01/08/2015 Medical Record Number: [id number] Patient Account Number: [id number] Date of Birth/Sex: 03-06-1929 (81 y.o. Female) Treating RN: Primary Care Physician: Lorane Gell Other Clinician: Referring Physician: Lorane Gell Treating Physician/Extender: Frann Rider in Treatment: 0 Diagnosis Coding ICD-10 Codes Code Description [phone] Laceration without foreign body, left lower leg, initial encounter S41.111A Laceration without foreign body of right upper arm, initial encounter Facility Procedures CPT4 Code Description: [id number] [facility] VISIT-LEV 3 EST PT Modifier: Quantity: 1 CPT4 Code Description: [id number] 97597 - [facility] WOUND 1ST 20 SQ CM OR < ICD-10 Description Diagnosis S41.111A Laceration without foreign body of right upper arm, i Modifier: nitial encou Quantity: 1  nter Physician Procedures CPT4 Code Description: [id number] 74128 - WC PHYS LEVEL 4 - NEW PT ICD-10 Description Diagnosis S81.812A Laceration without foreign body, left lower leg, init S41.111A Laceration without foreign body of right upper arm, i Modifier: ial encounte nitial encou Quantity: 1 r nter CPT4 Code Description: [id number] 09470 - WC PHYS DEBR WO ANESTH 20 SQ CM ICD-10 Description Diagnosis S41.111A Laceration without foreign body of right upper arm, i Modifier: nitial encou Quantity: 1 nter Electronic Signature(s) Signed: 01/08/2015 4:46:35 PM By: Montey Hora Previous Signature: 01/08/2015 4:42:08 PM Version By: Christin Fudge MD, FACS Entered By: Montey Hora on 01/08/2015 16:46:35

## 2015-01-11 ENCOUNTER — Telehealth: Payer: Self-pay | Admitting: *Deleted

## 2015-01-11 ENCOUNTER — Encounter: Payer: Self-pay | Admitting: General Surgery

## 2015-01-11 ENCOUNTER — Ambulatory Visit (INDEPENDENT_AMBULATORY_CARE_PROVIDER_SITE_OTHER): Payer: Medicare Other | Admitting: General Surgery

## 2015-01-11 VITALS — BP 148/76 | HR 62 | Resp 14 | Ht 65.0 in | Wt 127.0 lb

## 2015-01-11 DIAGNOSIS — K649 Unspecified hemorrhoids: Secondary | ICD-10-CM

## 2015-01-11 DIAGNOSIS — K625 Hemorrhage of anus and rectum: Secondary | ICD-10-CM

## 2015-01-11 LAB — POC HEMOCCULT BLD/STL (OFFICE/1-CARD/DIAGNOSTIC): FECAL OCCULT BLD: NEGATIVE

## 2015-01-11 MED ORDER — HYDROCORTISONE ACE-PRAMOXINE 2.5-1 % RE CREA: 1.0000 "application " | TOPICAL_CREAM | Freq: Three times a day (TID) | RECTAL | Status: DC

## 2015-01-11 NOTE — Progress Notes (Signed)
Patient ID: [name], female   DOB: 1928-09-20, 81 y.o.   MRN: [id number]  Chief Complaint  Patient presents with  . Rectal Bleeding    HPI [name] is a 81 y.o. female. she states had a BM 2 days ago and had hemorrhoid bleeding for about 20 minutes before it stopped. She then had a BM today and bleed for about 40 minutes. She has had trouble in the past. She states it was a lot of clots but denies pain.   She states her weight has been dropping over the past 3-4 months, she has gotten some ensure to try. She is going to the wound clinic for her arm and leg abrasions.   HPI  Past Medical History  Diagnosis Date  . Arthritis   . GERD (gastroesophageal reflux disease)   . Hypertension   . Chronic kidney disease   . Colon polyp   . UTI (lower urinary tract infection)   . Vasculitis     ANCA positive, pulmonary hemorrhage  . Thyroid disease 1957    hyperthyroid, had radioactiveiodine treatment   . Renal cell carcinoma 2003    s/p sgy for removal right kidney  . Hemorrhoids     Past Surgical History  Procedure Laterality Date  . Abdominal hysterectomy    . Bilateral salpingoophorectomy    . Hernia repair    . Fracture surgery Right     femur fracture  . Cholecystectomy  1957  . Nose surgery  2000  . Incontinence surgery  2006  . Cardiac catheterization  2008  . Colonoscopy w/ biopsies  May 27, 2011,01/10/14    tubular adenoma in the ascending colon and descending colon. Tubulovillous adenoma in the upper rectum 12 mm. Diverticulosis.  Marland Kitchen Upper gi endoscopy  May 30, 2004    large hiatal hernia, duodenal diverticulum.    Family History  Problem Relation Age of Onset  . Arthritis Mother   . Heart disease Mother   . Heart disease Father   . Heart disease Sister     CAD  . Hypertension Sister     Social History History  Substance Use Topics  . Smoking status: Never Smoker   . Smokeless tobacco: Never Used  . Alcohol Use: No    Allergies   Allergen Reactions  . Amoxicillin Other (See Comments)    Hand tingling   . Nsaids   . Tolmetin Other (See Comments)    Cannot take because of Kidneys    Current Outpatient Prescriptions  Medication Sig Dispense Refill  . aspirin 81 MG tablet Take 81 mg by mouth daily.    . cholecalciferol (VITAMIN D) 1000 UNITS tablet Take 1,000 Units by mouth daily.    Marland Kitchen CRANBERRY PO Take 84 mg by mouth daily.    . ferrous sulfate 325 (65 FE) MG tablet Take by mouth 3 (three) times daily with meals.     Marland Kitchen lisinopril-hydrochlorothiazide (PRINZIDE,ZESTORETIC) 10-12.5 MG per tablet Take 1 tablet by mouth daily.    . metoprolol tartrate (LOPRESSOR) 25 MG tablet Take 12.5 mg by mouth 2 (two) times daily.    . Multiple Vitamins-Minerals (CENTRUM SILVER PO) Take by mouth.    . Multiple Vitamins-Minerals (IMMUNE SYSTEM BOOSTER PO) Take by mouth. Ambatrose (Immune System) 3 tabs daily    . Omega-3 Fatty Acids (FISH OIL) 600 MG CAPS Take 1 capsule by mouth daily.    Marland Kitchen OVER THE COUNTER MEDICATION Take 3 tablets by mouth daily. Real coral calcium    .  polyethylene glycol powder (GLYCOLAX/MIRALAX) powder Take 255 g (1 Container total) by mouth once. 255 g 0  . predniSONE (DELTASONE) 5 MG tablet Take 5 mg by mouth daily.    . Probiotic Product (PROBIOTIC DAILY PO) Take 1 tablet by mouth daily.    . Turmeric 500 MG CAPS Take 2 capsules by mouth daily.    . vitamin C (ASCORBIC ACID) 500 MG tablet Take 500 mg by mouth daily.    . Wheat Dextrin (BENEFIBER DRINK MIX) PACK Take by mouth.    . hydrocortisone-pramoxine (ANALPRAM HC) 2.5-1 % rectal cream Place 1 application rectally 3 (three) times daily. 30 g 2   No current facility-administered medications for this visit.    Review of Systems Review of Systems  Constitutional: Negative.   Respiratory: Negative.   Cardiovascular: Negative.   Gastrointestinal: Positive for constipation and anal bleeding.    Blood pressure 148/76, pulse 62, resp. rate 14, height  5\' 5"  (1.651 m), weight 127 lb (57.607 kg). The patient weight 131 pounds at the time of her June 2015 exam.   Physical Exam Physical Exam  Constitutional: She is oriented to person, place, and time. She appears well-developed and well-nourished.  Neck: Neck supple.  Cardiovascular: Normal rate and normal heart sounds.  An irregular rhythm present.  Pulmonary/Chest: Effort normal and breath sounds normal.  Abdominal: Soft. Normal appearance and bowel sounds are normal. A hernia is present.  Right reducible inguinal hernia  Genitourinary: Guaiac negative stool.     Lymphadenopathy:    She has no cervical adenopathy.  Neurological: She is alert and oriented to person, place, and time.  Skin: Skin is warm and dry.    Data Reviewed Stool sample was Hemoccult-negative.  Anoscopy showed minimal internal hemorrhoidal disease and no abnormality of the visualized rectal mucosa. There was evidence of active bleeding from the 1 cm mulberry-like mass at the 5:00 position on what appeared to be chronically prolapsing rectal mucosa.  Assessment    Rectal bleeding secondary to hemorrhoid.    Plan    The patient tolerated the procedure well. Mild persistent discomfort for which a prescription for tramadol, 50 mg with the inscription one by mouth every 4 hours when necessary for pain was provided, #30 with no refills. Opportunity for operative intervention was declined at this time.  The patient was also had a prescription for Analpram cream sent to her pharmacy.  We'll plan on a follow-up examination in 2 weeks anticipating resolution by that time.     PCP:  Carolin Coy 01/12/2015, 12:47 PM

## 2015-01-11 NOTE — Patient Instructions (Signed)
The patient is aware to call back for any questions or concerns.  

## 2015-01-11 NOTE — Telephone Encounter (Signed)
Appointment per [facility]

## 2015-01-11 NOTE — Telephone Encounter (Signed)
She called to say she had a BM 2 days ago and had hemorrhoid bleeding for about 20 minutes before it stopped. She then had a BM today and bleed for about 40 minutes. She has had trouble in the past and was told by Dr Bary Castilla to call if she had troubles again not to wait. She states it was a lot of clots but denies pain. She is very scared since she had so much bleeding and wasn't sure when she could come in?

## 2015-01-12 DIAGNOSIS — K649 Unspecified hemorrhoids: Secondary | ICD-10-CM | POA: Insufficient documentation

## 2015-01-15 ENCOUNTER — Encounter: Payer: Medicare Other | Admitting: Surgery

## 2015-01-15 ENCOUNTER — Telehealth: Payer: Self-pay | Admitting: *Deleted

## 2015-01-15 ENCOUNTER — Telehealth: Payer: Self-pay | Admitting: Nurse Practitioner

## 2015-01-15 ENCOUNTER — Telehealth: Payer: Self-pay | Admitting: General Surgery

## 2015-01-15 DIAGNOSIS — S81812A Laceration without foreign body, left lower leg, initial encounter: Secondary | ICD-10-CM | POA: Diagnosis not present

## 2015-01-15 NOTE — Telephone Encounter (Signed)
Patient Name: [name] DOB: [birth year] Initial Comment Caller states her BP is 162/83. Head is feeling heavy. Nurse Assessment Guidelines Guideline Title Affirmed Question Affirmed Notes Final Disposition User FINAL ATTEMPT MADE - no message left [place], Therapist, sports, Kermit Balo

## 2015-01-15 NOTE — Telephone Encounter (Signed)
Spoke with pt and she states she has not been on BP medicine for 3 years and can't remember the name of it, but would like to have something called into pharmacy to get her numbers down.  She has not checked it since this morning, but is worried she will have a stroke. Last OV 01/09/15.

## 2015-01-15 NOTE — Telephone Encounter (Signed)
01-15-15 PT CALLED BACK STATING YOU CALLED HER Friday TO CK ON HER.SHE SAYS SHE IS STILL BLEEDING A LITTLE CURRENTLY ALL THE TIME.SHE CAN BE REACHED @ [phone].

## 2015-01-15 NOTE — Telephone Encounter (Signed)
Pt came in to the clinic requesting BP medication refill.  Review of chart I dont see that you have ever refilled BP medication for her.  It does however look like at 6.7.16 appoint her Metoprolol was discontinued by you.  It says change in therapy but I dont see a new Rx.  Pt states today her BP was 162/83 and HR 54.  Please advise

## 2015-01-15 NOTE — Progress Notes (Signed)
[name] ([id number]) Visit Report for 01/15/2015 Arrival Information Details Patient Name: [name], [name] Date of Service: 01/15/2015 11:30 AM Medical Record Number: [id number] Patient Account Number: [id number] Date of Birth/Sex: 1928/10/29 (81 y.o. Female) Treating RN: Afful, RN, BSN, Velva Harman Primary Care Physician: Lorane Gell Other Clinician: Referring Physician: Lorane Gell Treating Physician/Extender: Frann Rider in Treatment: 1 Visit Information History Since Last Visit Any new allergies or adverse reactions: No Patient Arrived: Ambulatory Had a fall or experienced change in No Arrival Time: 11:24 activities of daily living that may affect Accompanied By: self risk of falls: Transfer Assistance: None Signs or symptoms of abuse/neglect since last No Patient Identification Verified: Yes visito Secondary Verification Process Yes Hospitalized since last visit: No Completed: Has Dressing in Place as Prescribed: Yes Patient Has Alerts: Yes Pain Present Now: No Patient Alerts: Patient on Blood Thinner ASA 81mg  6/15 ABI L-1.34 R- 0.79 Electronic Signature(s) Signed: 01/15/2015 3:19:52 PM By: Regan Lemming BSN, RN Entered By: Regan Lemming on 01/15/2015 11:25:30 [name] ([id number]) -------------------------------------------------------------------------------- Clinic Level of Care Assessment Details Patient Name: [name] Date of Service: 01/15/2015 11:30 AM Medical Record Number: [id number] Patient Account Number: [id number] Date of Birth/Sex: 04-Mar-1929 (81 y.o. Female) Treating RN: Montey Hora Primary Care Physician: Lorane Gell Other Clinician: Referring Physician: Lorane Gell Treating Physician/Extender: Frann Rider in Treatment: 1 Clinic Level of Care Assessment Items TOOL 4 Quantity Score []  - Use when only an EandM is performed on FOLLOW-UP visit 0 ASSESSMENTS - Nursing Assessment / Reassessment X - Reassessment of  Co-morbidities (includes updates in patient status) 1 10 X - Reassessment of Adherence to Treatment Plan 1 5 ASSESSMENTS - Wound and Skin Assessment / Reassessment []  - Simple Wound Assessment / Reassessment - one wound 0 X - Complex Wound Assessment / Reassessment - multiple wounds 2 5 []  - Dermatologic / Skin Assessment (not related to wound area) 0 ASSESSMENTS - Focused Assessment X - Circumferential Edema Measurements - multi extremities 1 5 []  - Nutritional Assessment / Counseling / Intervention 0 X - Lower Extremity Assessment (monofilament, tuning fork, pulses) 1 5 []  - Peripheral Arterial Disease Assessment (using Kelly held doppler) 0 ASSESSMENTS - Ostomy and/or Continence Assessment and Care []  - Incontinence Assessment and Management 0 []  - Ostomy Care Assessment and Management (repouching, etc.) 0 PROCESS - Coordination of Care X - Simple Patient / Family Education for ongoing care 1 15 []  - Complex (extensive) Patient / Family Education for ongoing care 0 []  - Staff obtains Programmer, systems, Records, Test Results / Process Orders 0 []  - Staff telephones HHA, Nursing Homes / Clarify orders / etc 0 []  - Routine Transfer to another Facility (non-emergent condition) 0 [name], [name] ([id number]) []  - Routine Hospital Admission (non-emergent condition) 0 []  - New Admissions / Biomedical engineer / Ordering NPWT, Apligraf, etc. 0 []  - Emergency Hospital Admission (emergent condition) 0 X - Simple Discharge Coordination 1 10 []  - Complex (extensive) Discharge Coordination 0 PROCESS - Special Needs []  - Pediatric / Minor Patient Management 0 []  - Isolation Patient Management 0 []  - Hearing / Language / Visual special needs 0 []  - Assessment of Community assistance (transportation, D/C planning, etc.) 0 []  - Additional assistance / Altered mentation 0 []  - Support Surface(s) Assessment (bed, cushion, seat, etc.) 0 INTERVENTIONS - Wound Cleansing / Measurement []  - Simple Wound  Cleansing - one wound 0 X - Complex Wound Cleansing - multiple wounds 2 5 X - Wound Imaging (photographs - any  number of wounds) 1 5 []  - Wound Tracing (instead of photographs) 0 []  - Simple Wound Measurement - one wound 0 X - Complex Wound Measurement - multiple wounds 2 5 INTERVENTIONS - Wound Dressings X - Small Wound Dressing one or multiple wounds 2 10 []  - Medium Wound Dressing one or multiple wounds 0 []  - Large Wound Dressing one or multiple wounds 0 []  - Application of Medications - topical 0 []  - Application of Medications - injection 0 INTERVENTIONS - Miscellaneous []  - External ear exam 0 [name], [name] ([id number]) []  - Specimen Collection (cultures, biopsies, blood, body fluids, etc.) 0 []  - Specimen(s) / Culture(s) sent or taken to Lab for analysis 0 []  - Patient Transfer (multiple staff / Harrel Lemon Lift / Similar devices) 0 []  - Simple Staple / Suture removal (25 or less) 0 []  - Complex Staple / Suture removal (26 or more) 0 []  - Hypo / Hyperglycemic Management (close monitor of Blood Glucose) 0 []  - Ankle / Brachial Index (ABI) - do not check if billed separately 0 X - Vital Signs 1 5 Has the patient been seen at the hospital within the last three years: Yes Total Score: 110 Level Of Care: New/Established - Level 3 Electronic Signature(s) Signed: 01/15/2015 12:13:22 PM By: Montey Hora Entered By: Montey Hora on 01/15/2015 12:13:21 [name] ([id number]) -------------------------------------------------------------------------------- Encounter Discharge Information Details Patient Name: [name] Date of Service: 01/15/2015 11:30 AM Medical Record Number: [id number] Patient Account Number: [id number] Date of Birth/Sex: [birth year] (81 y.o. Female) Treating RN: Afful, RN, BSN, Velva Harman Primary Care Physician: Lorane Gell Other Clinician: Referring Physician: Lorane Gell Treating Physician/Extender: Frann Rider in Treatment: 1 Encounter  Discharge Information Items Discharge Pain Level: 0 Discharge Condition: Stable Ambulatory Status: Cane Discharge Destination: Home Transportation: Private Auto Accompanied By: self Schedule Follow-up Appointment: No Medication Reconciliation completed No and provided to Patient/Care Genice Kimberlin: Provided on Clinical Summary of Care: 01/15/2015 Form Type Recipient Paper Patient NB Electronic Signature(s) Signed: 01/15/2015 3:19:52 PM By: Regan Lemming BSN, RN Previous Signature: 01/15/2015 11:45:37 AM Version By: Ruthine Dose Entered By: Regan Lemming on 01/15/2015 11:50:46 Colello, Grayland Kelly ([id number]) -------------------------------------------------------------------------------- Lower Extremity Assessment Details Patient Name: [name] Date of Service: 01/15/2015 11:30 AM Medical Record Number: [id number] Patient Account Number: [id number] Date of Birth/Sex: 1929-02-18 (81 y.o. Female) Treating RN: Afful, RN, BSN, [place] Primary Care Physician: Lorane Gell Other Clinician: Referring Physician: Lorane Gell Treating Physician/Extender: Frann Rider in Treatment: 1 Edema Assessment Assessed: [Left: No] [Right: No] Edema: [Left: N] [Right: o] Calf Left: Right: Point of Measurement: 31 cm From Medial Instep 34.2 cm cm Ankle Left: Right: Point of Measurement: 12 cm From Medial Instep 20.5 cm cm Vascular Assessment Claudication: Claudication Assessment [Left:None] Pulses: Posterior Tibial Dorsalis Pedis Palpable: [Left:Yes] Extremity colors, hair growth, and conditions: Extremity Color: [Left:Mottled] Hair Growth on Extremity: [Left:Yes] Temperature of Extremity: [Left:Warm] Capillary Refill: [Left:< 3 seconds] Dependent Rubor: [Left:No] Blanched when Elevated: [Left:No] Lipodermatosclerosis: [Left:No] Toe Nail Assessment Left: Right: Thick: No Discolored: No Deformed: No Improper Length and Hygiene: No DASHANTI, BURR ([id number]) Electronic  Signature(s) Signed: 01/15/2015 3:19:52 PM By: Regan Lemming BSN, RN Entered By: Regan Lemming on 01/15/2015 11:27:36 Bobrowski, Grayland Kelly ([id number]) -------------------------------------------------------------------------------- Multi Wound Chart Details Patient Name: [name] Date of Service: 01/15/2015 11:30 AM Medical Record Number: [id number] Patient Account Number: [id number] Date of Birth/Sex: 07-31-1929 (81 y.o. Female) Treating RN: Montey Hora Primary Care Physician: Lorane Gell Other Clinician: Referring Physician: Lorane Gell Treating  Physician/Extender: Frann Rider in Treatment: 1 Vital Signs Height(in): 65 Pulse(bpm): 54 Weight(lbs): 129.3 Blood Pressure 162/83 (mmHg): Body Mass Index(BMI): 22 Temperature(F): 97.8 Respiratory Rate 18 (breaths/min): Photos: [1:No Photos] [2:No Photos] [N/A:N/A] Wound Location: [1:Right Lower Leg - Anterior Right Forearm - Posterior] [N/A:N/A] Wounding Event: [1:Trauma] [2:Trauma] [N/A:N/A] Primary Etiology: [1:Trauma, Other] [2:Trauma, Other] [N/A:N/A] Comorbid History: [1:Cataracts, Chronic sinus Cataracts, Chronic sinus problems/congestion, Angina, Hypertension, Vasculitis, Osteoarthritis Vasculitis, Osteoarthritis] [2:problems/congestion, Angina, Hypertension,] [N/A:N/A] Date Acquired: [1:12/08/2014] [2:12/25/2014] [N/A:N/A] Weeks of Treatment: [1:1] [2:1] [N/A:N/A] Wound Status: [1:Open] [2:Open] [N/A:N/A] Measurements L x W x D 0.4x0.3x0.1 [2:1.6x1.8x0.1] [N/A:N/A] (cm) Area (cm) : [1:0.094] [2:2.262] [N/A:N/A] Volume (cm) : [1:0.009] [2:0.226] [N/A:N/A] % Reduction in Area: [1:40.10%] [2:65.90%] [N/A:N/A] % Reduction in Volume: 43.80% [2:66.00%] [N/A:N/A] Classification: [1:Full Thickness Without Exposed Support Structures] [2:Full Thickness Without Exposed Support Structures] [N/A:N/A] Exudate Amount: [1:None Present] [2:Small] [N/A:N/A] Wound Margin: [1:Indistinct, nonvisible] [2:Indistinct, nonvisible]  [N/A:N/A] Granulation Amount: [1:Small (1-33%)] [2:Small (1-33%)] [N/A:N/A] Granulation Quality: [1:Red] [2:Pink] [N/A:N/A] Necrotic Amount: [1:Small (1-33%)] [2:Small (1-33%)] [N/A:N/A] Necrotic Tissue: [1:Adherent Slough] [2:Eschar] [N/A:N/A] Exposed Structures: [1:Fascia: No Fat: No Tendon: No Muscle: No] [2:Fascia: No Fat: No Tendon: No Muscle: No] [N/A:N/A] Joint: No Joint: No Bone: No Bone: No Limited to Skin Limited to Skin Breakdown Breakdown Epithelialization: Small (1-33%) Small (1-33%) N/A Periwound Skin Texture: Edema: No Edema: No N/A Excoriation: No Excoriation: No Induration: No Induration: No Callus: No Callus: No Crepitus: No Crepitus: No Fluctuance: No Fluctuance: No Friable: No Friable: No Rash: No Rash: No Scarring: No Scarring: No Periwound Skin Dry/Scaly: Yes Dry/Scaly: Yes N/A Moisture: Maceration: No Maceration: No Moist: No Moist: No Periwound Skin Color: Mottled: Yes Ecchymosis: Yes N/A Atrophie Blanche: No Atrophie Blanche: No Cyanosis: No Cyanosis: No Ecchymosis: No Erythema: No Erythema: No Hemosiderin Staining: No Hemosiderin Staining: No Mottled: No Pallor: No Pallor: No Rubor: No Rubor: No Temperature: No Abnormality No Abnormality N/A Tenderness on No No N/A Palpation: Wound Preparation: Ulcer Cleansing: Ulcer Cleansing: N/A Rinsed/Irrigated with Rinsed/Irrigated with Saline Saline Topical Anesthetic Topical Anesthetic Applied: Other: Lidocaine Applied: Other: Lidocaine 4% Ointment 4% Ointment Treatment Notes Electronic Signature(s) Signed: 01/15/2015 5:12:42 PM By: Montey Hora Entered By: Montey Hora on 01/15/2015 11:37:05 Welle, Grayland Kelly ([id number]) -------------------------------------------------------------------------------- [place] Details Patient Name: [name] Date of Service: 01/15/2015 11:30 AM Medical Record Number: [id number] Patient Account Number:  [id number] Date of Birth/Sex: 06/28/1929 (81 y.o. Female) Treating RN: Montey Hora Primary Care Physician: Lorane Gell Other Clinician: Referring Physician: Lorane Gell Treating Physician/Extender: Frann Rider in Treatment: 1 Active Inactive Abuse / Safety / Falls / Self Care Management Nursing Diagnoses: Potential for falls Goals: Patient will remain injury free Date Initiated: 01/08/2015 Goal Status: Active Interventions: Assess fall risk on admission and as needed Notes: Nutrition Nursing Diagnoses: Potential for alteratiion in Nutrition/Potential for imbalanced nutrition Goals: Patient/caregiver agrees to and verbalizes understanding of need to use nutritional supplements and/or vitamins as prescribed Date Initiated: 01/08/2015 Goal Status: Active Interventions: Assess patient nutrition upon admission and as needed per policy Notes: Orientation to the Wound Care Program Nursing Diagnoses: Knowledge deficit related to the wound healing center program Goals: Patient/caregiver will verbalize understanding of the [place], [place]. ([id number]) Date Initiated: 01/08/2015 Goal Status: Active Interventions: Provide education on orientation to the wound center Notes: Wound/Skin Impairment Nursing Diagnoses: Impaired tissue integrity Goals: Ulcer/skin breakdown will have a volume reduction of 30% by week 4 Date Initiated: 01/08/2015 Goal Status: Active Interventions: Assess ulceration(s) every visit Notes:  Electronic Signature(s) Signed: 01/15/2015 5:12:42 PM By: Montey Hora Entered By: Montey Hora on 01/15/2015 11:36:39 [place]ham, Grayland Kelly ([id number]) -------------------------------------------------------------------------------- Pain Assessment Details Patient Name: [name] Date of Service: 01/15/2015 11:30 AM Medical Record Number: [id number] Patient Account Number: [id number] Date of Birth/Sex: 09/19/1928 (81 y.o.  Female) Treating RN: Afful, RN, BSN, Velva Harman Primary Care Physician: Lorane Gell Other Clinician: Referring Physician: Lorane Gell Treating Physician/Extender: Frann Rider in Treatment: 1 Active Problems Location of Pain Severity and Description of Pain Patient Has Paino No Site Locations Pain Management and Medication Current Pain Management: Electronic Signature(s) Signed: 01/15/2015 3:19:52 PM By: Regan Lemming BSN, RN Entered By: Regan Lemming on 01/15/2015 11:25:37 Vinje, Grayland Kelly ([id number]) -------------------------------------------------------------------------------- Patient/Caregiver Education Details Patient Name: [name] Date of Service: 01/15/2015 11:30 AM Medical Record Number: [id number] Patient Account Number: [id number] Date of Birth/Gender: 1929/02/17 (81 y.o. Female) Treating RN: Afful, RN, BSN, Velva Harman Primary Care Physician: Lorane Gell Other Clinician: Referring Physician: Lorane Gell Treating Physician/Extender: Frann Rider in Treatment: 1 Education Assessment Education Provided To: Patient Education Topics Provided Wound/Skin Impairment: Handouts: Other: keep dresing in place until next visit Methods: Explain/Verbal Responses: State content correctly Electronic Signature(s) Signed: 01/15/2015 3:19:52 PM By: Regan Lemming BSN, RN Entered By: Regan Lemming on 01/15/2015 11:51:14 Yackley, Grayland Kelly ([id number]) -------------------------------------------------------------------------------- Wound Assessment Details Patient Name: [name] Date of Service: 01/15/2015 11:30 AM Medical Record Number: [id number] Patient Account Number: [id number] Date of Birth/Sex: 03-13-1929 (81 y.o. Female) Treating RN: Afful, RN, BSN, Velva Harman Primary Care Physician: Lorane Gell Other Clinician: Referring Physician: Lorane Gell Treating Physician/Extender: Frann Rider in Treatment: 1 Wound Status Wound Number: 1 Primary Trauma,  Other Etiology: Wound Location: Right Lower Leg - Anterior Wound Open Wounding Event: Trauma Status: Date Acquired: 12/08/2014 Comorbid Cataracts, Chronic sinus Weeks Of Treatment: 1 History: problems/congestion, Angina, Clustered Wound: No Hypertension, Vasculitis, Osteoarthritis Photos Photo Uploaded By: Regan Lemming on 01/15/2015 15:15:24 Wound Measurements Length: (cm) 0.4 Width: (cm) 0.3 Depth: (cm) 0.1 Area: (cm) 0.094 Volume: (cm) 0.009 % Reduction in Area: 40.1% % Reduction in Volume: 43.8% Epithelialization: Small (1-33%) Wound Description Full Thickness Without Exposed Foul Odor Af Classification: Support Structures Wound Margin: Indistinct, nonvisible Exudate None Present Amount: ter Cleansing: No Wound Bed Granulation Amount: Small (1-33%) Exposed Structure Granulation Quality: Red Fascia Exposed: No Necrotic Amount: Small (1-33%) Fat Layer Exposed: No Necrotic Quality: Adherent Slough Tendon Exposed: No Withers, Adessa W. ([id number]) Muscle Exposed: No Joint Exposed: No Bone Exposed: No Limited to Skin Breakdown Periwound Skin Texture Texture Color No Abnormalities Noted: No No Abnormalities Noted: No Callus: No Atrophie Blanche: No Crepitus: No Cyanosis: No Excoriation: No Ecchymosis: No Fluctuance: No Erythema: No Friable: No Hemosiderin Staining: No Induration: No Mottled: Yes Localized Edema: No Pallor: No Rash: No Rubor: No Scarring: No Temperature / Pain Moisture Temperature: No Abnormality No Abnormalities Noted: No Dry / Scaly: Yes Maceration: No Moist: No Wound Preparation Ulcer Cleansing: Rinsed/Irrigated with Saline Topical Anesthetic Applied: Other: Lidocaine 4% Ointment , Treatment Notes Wound #1 (Right, Anterior Lower Leg) 1. Cleansed with: Clean wound with Normal Saline 3. Peri-wound Care: Skin Prep 4. Dressing Applied: Mepitel 5. Secondary Dressing Applied Dry Gauze Tegaderm Electronic  Signature(s) Signed: 01/15/2015 3:19:52 PM By: Regan Lemming BSN, RN Entered By: Regan Lemming on 01/15/2015 11:30:45 Ehle, Grayland Kelly ([id number]) -------------------------------------------------------------------------------- Wound Assessment Details Patient Name: [name] Date of Service: 01/15/2015 11:30 AM Medical Record Number: [id number] Patient Account Number: [id number] Date of Birth/Sex: Dec 15, 1928 (81 y.o. Female) Treating  RN: Baruch Gouty, RN, BSN, [place] Primary Care Physician: Lorane Gell Other Clinician: Referring Physician: Lorane Gell Treating Physician/Extender: Frann Rider in Treatment: 1 Wound Status Wound Number: 2 Primary Trauma, Other Etiology: Wound Location: Right Forearm - Posterior Wound Open Wounding Event: Trauma Status: Date Acquired: 12/25/2014 Comorbid Cataracts, Chronic sinus Weeks Of Treatment: 1 History: problems/congestion, Angina, Clustered Wound: No Hypertension, Vasculitis, Osteoarthritis Photos Photo Uploaded By: Junious Dresser on 01/15/2015 12:24:55 Wound Measurements Length: (cm) 1.6 Width: (cm) 1.8 Depth: (cm) 0.1 Area: (cm) 2.262 Volume: (cm) 0.226 % Reduction in Area: 65.9% % Reduction in Volume: 66% Epithelialization: Small (1-33%) Tunneling: No Undermining: No Wound Description Full Thickness Without Exposed Foul Odor Af Classification: Support Structures Wound Margin: Indistinct, nonvisible Small Constable, Kaelee W. ([id number]) ter Cleansing: No Exudate Amount: Wound Bed Granulation Amount: Small (1-33%) Exposed Structure Granulation Quality: Pink Fascia Exposed: No Necrotic Amount: Small (1-33%) Fat Layer Exposed: No Necrotic Quality: Eschar Tendon Exposed: No Muscle Exposed: No Joint Exposed: No Bone Exposed: No Limited to Skin Breakdown Periwound Skin Texture Texture Color No Abnormalities Noted: No No Abnormalities Noted: No Callus: No Atrophie Blanche: No Crepitus: No Cyanosis:  No Excoriation: No Ecchymosis: Yes Fluctuance: No Erythema: No Friable: No Hemosiderin Staining: No Induration: No Mottled: No Localized Edema: No Pallor: No Rash: No Rubor: No Scarring: No Temperature / Pain Moisture Temperature: No Abnormality No Abnormalities Noted: No Dry / Scaly: Yes Maceration: No Moist: No Wound Preparation Ulcer Cleansing: Rinsed/Irrigated with Saline Topical Anesthetic Applied: Other: Lidocaine 4% Ointment , Treatment Notes Wound #2 (Right, Posterior Forearm) 1. Cleansed with: Clean wound with Normal Saline 3. Peri-wound Care: Skin Prep 4. Dressing Applied: Mepitel 5. Secondary Dressing Applied Dry Gauze Tegaderm JAYLAN, DUGGAR ([id number]) Electronic Signature(s) Signed: 01/15/2015 3:19:52 PM By: Regan Lemming BSN, RN Entered By: Regan Lemming on 01/15/2015 11:31:18 Marbach, Grayland Kelly ([id number]) -------------------------------------------------------------------------------- Vitals Details Patient Name: [name] Date of Service: 01/15/2015 11:30 AM Medical Record Number: [id number] Patient Account Number: [id number] Date of Birth/Sex: Apr 22, 1929 (81 y.o. Female) Treating RN: Afful, RN, BSN, Velva Harman Primary Care Physician: Lorane Gell Other Clinician: Referring Physician: Lorane Gell Treating Physician/Extender: Frann Rider in Treatment: 1 Vital Signs Time Taken: 11:25 Temperature (F): 97.8 Height (in): 65 Pulse (bpm): 54 Weight (lbs): 129.3 Respiratory Rate (breaths/min): 18 Body Mass Index (BMI): 21.5 Blood Pressure (mmHg): 162/83 Reference Range: 80 - 120 mg / dl Electronic Signature(s) Signed: 01/15/2015 3:19:52 PM By: Regan Lemming BSN, RN Entered By: Regan Lemming on 01/15/2015 11:26:19

## 2015-01-15 NOTE — Telephone Encounter (Signed)
She needs to follow up with Dr. Clayborn Bigness. This was already discussed with her when she walked in today with N. Pulliam during lunch today. She verbalized understanding that she has medication at the pharmacy.

## 2015-01-15 NOTE — Telephone Encounter (Signed)
Please touch base with the patient on Tuesday, June 14 to see if there is any residual pain and, to bleeding she is experiencing. Follow up otherwise will be as previously planned.

## 2015-01-15 NOTE — Telephone Encounter (Signed)
Spoke with pharmacist, to request the call prescriber for BP medication.  Pharmacist stated pt had BP medication on hold waiting pick up.

## 2015-01-15 NOTE — Progress Notes (Signed)
ARDELLE, HALIBURTON ([id number]) Visit Report for 01/15/2015 Chief Complaint Document Details Patient Name: [name], [name] 01/15/2015 11:30 Date of Service: AM Medical Record [id number] Number: Patient Account Number: [id number] 03-08-1929 (81 y.o. Treating RN: Date of Birth/Sex: Female) Other Clinician: Primary Care Physician: DOSS, CARRIE Treating Alcide Memoli Referring Physician: Lorane Gell Physician/Extender: Weeks in Treatment: 1 Information Obtained from: Patient Chief Complaint Patient presents to the wound care center for a consult due non healing wound. pleasant 81 year old patient injured her left lower extremity against the dishwasher door about 6 weeks ago and then had a separate injury to her right forearm about 2 weeks ago. Electronic Signature(s) Signed: 01/15/2015 12:20:11 PM By: Christin Fudge MD, FACS Entered By: Christin Fudge on 01/15/2015 11:41:02 Fukushima, [name] ([id number]) -------------------------------------------------------------------------------- HPI Details Patient Name: Suzanne, Kelly 01/15/2015 11:30 Date of Service: AM Medical Record [id number] Number: Patient Account Number: [id number] 12/03/1928 (81 y.o. Treating RN: Date of Birth/Sex: Female) Other Clinician: Primary Care Physician: DOSS, CARRIE Treating Haidan Nhan Referring Physician: Lorane Gell Physician/Extender: Weeks in Treatment: 1 History of Present Illness Location: left lower extremity and right forearm Quality: Patient reports experiencing a dull pain to affected area(s). Severity: Patient states wound (s) are getting better. Duration: Patient states the wound has been present for 6 and 2 weeks respectively Context: The wound occurred when the patient injured her leg on a dishwasher door and on the car door for her right forearm. Modifying Factors: Consults to this date include:urgent care for a right forearm 2 weeks ago when there was glue and Steri-Strips  applied. HPI Description: Pleasant 81 year old who came with a history of hitting her left leg on a dishwasher about 6 weeks ago. She had been initially applying Neosporin and some other local care and had not gotten well and hence was referred to the wound care center by her PCP. About 10 days ago she also skinned her right forearm when she was getting out of the car and does some skin off. She was seen at the urgent care and Steri-Strips were applied and was given some instructions for wound care. Past Medical Historyosignificant LEX:[id number];;SWHQ (gastroesophageal reflux disease);Hypertension;Chronic kidney diseaseo;Vasculitis, ANCA positive, pulmonary hemorrhage;Thyroid disease;hyperthyroid, had radioactiveiodine treatment;Renal cell carcinoma 2003 s/p sgy for removal right kidney;Hemorrhoids.o he has also had several surgeries including abdominal hysterectomy with bilateral salpingo-oophorectomy, hernia repair, fracture of her femur on the right side, cholecystectomy, cardiac catheterization. she has been a nonsmoker all her life. Electronic Signature(s) Signed: 01/15/2015 12:20:11 PM By: Christin Fudge MD, FACS Entered By: Christin Fudge on 01/15/2015 11:41:07 Stoffel, [name] ([id number]) -------------------------------------------------------------------------------- Physical Exam Details Patient Name: Suzanne, Kelly 01/15/2015 11:30 Date of Service: AM Medical Record [id number] Number: Patient Account Number: [id number] [birth year] (81 y.o. Treating RN: Date of Birth/Sex: Female) Other Clinician: Primary Care Physician: DOSS, CARRIE Treating Hellen Shanley Referring Physician: Lorane Gell Physician/Extender: Weeks in Treatment: 1 Constitutional . Pulse regular. Respirations normal and unlabored. Afebrile. . Eyes Nonicteric. Reactive to light. Ears, Nose, Mouth, and Throat Lips, teeth, and gums WNL.Marland Kitchen Moist mucosa without lesions . Neck supple and nontender. No palpable  supraclavicular or cervical adenopathy. Normal sized without goiter. Respiratory WNL. No retractions.. Cardiovascular Pedal Pulses WNL. No clubbing, cyanosis or edema. Musculoskeletal Adexa without tenderness or enlargement.. Digits and nails w/o clubbing, cyanosis, infection, petechiae, ischemia, or inflammatory conditions.. Integumentary (Hair, Skin) the wounds on her right upper extremity and left lower extremity look excellent. Very little open ulcerated area.. No crepitus or fluctuance. No peri-wound warmth or erythema. No  masses.Marland Kitchen Psychiatric Judgement and insight Intact.. No evidence of depression, anxiety, or agitation.. Electronic Signature(s) Signed: 01/15/2015 12:20:11 PM By: Christin Fudge MD, FACS Entered By: Christin Fudge on 01/15/2015 11:41:46 Cumberland, [name] ([id number]) -------------------------------------------------------------------------------- Physician Orders Details Patient Name: Suzanne, Kelly 01/15/2015 11:30 Date of Service: AM Medical Record [id number] Number: Patient Account Number: [id number] [birth year] (81 y.o. Treating RN: Montey Hora Date of Birth/Sex: Female) Other Clinician: Primary Care Physician: DOSS, CARRIE Treating Benedicto Capozzi Referring Physician: Lorane Gell Physician/Extender: Suella Grove in Treatment: 1 Verbal / Phone Orders: Yes Clinician: Montey Hora Read Back and Verified: Yes Diagnosis Coding Wound Cleansing Wound #1 Right,Anterior Lower Leg o Clean wound with Normal Saline. Wound #2 Right,Posterior Forearm o Clean wound with Normal Saline. Anesthetic Wound #1 Right,Anterior Lower Leg o Topical Lidocaine 4% cream applied to wound bed prior to debridement Wound #2 Right,Posterior Forearm o Topical Lidocaine 4% cream applied to wound bed prior to debridement Primary Wound Dressing Wound #1 Right,Anterior Lower Leg o Dry Gauze o Other: - mepitel one Wound #2 Right,Posterior Forearm o Dry Gauze o Other:  - mepitel one Secondary Dressing Wound #1 Right,Anterior Lower Leg o Tegaderm Wound #2 Right,Posterior Forearm o Tegaderm Dressing Change Frequency Wound #1 Right,Anterior Lower Leg o Change dressing every week Toran, Anjelita W. ([id number]) Wound #2 Right,Posterior Forearm o Change dressing every week Follow-up Appointments Wound #1 Right,Anterior Lower Leg o Return Appointment in 1 week. Wound #2 Right,Posterior Forearm o Return Appointment in 1 week. Electronic Signature(s) Signed: 01/15/2015 12:20:11 PM By: Christin Fudge MD, FACS Signed: 01/15/2015 5:12:42 PM By: Montey Hora Entered By: Montey Hora on 01/15/2015 11:40:20 Tagliaferri, [name] ([id number]) -------------------------------------------------------------------------------- Problem List Details Patient Name: MARDIE, KELLEN 01/15/2015 11:30 Date of Service: AM Medical Record [id number] Number: Patient Account Number: [id number] [birth year] (81 y.o. Treating RN: Date of Birth/Sex: Female) Other Clinician: Primary Care Physician: DOSS, CARRIE Treating Nanci Lakatos Referring Physician: Lorane Gell Physician/Extender: Weeks in Treatment: 1 Active Problems ICD-10 Encounter Code Description Active Date Diagnosis S81.812A Laceration without foreign body, left lower leg, initial 01/08/2015 Yes encounter S41.111A Laceration without foreign body of right upper arm, initial 01/08/2015 Yes encounter Inactive Problems Resolved Problems Electronic Signature(s) Signed: 01/15/2015 12:20:11 PM By: Christin Fudge MD, FACS Entered By: Christin Fudge on 01/15/2015 11:40:55 Buchbinder, [name] ([id number]) -------------------------------------------------------------------------------- Progress Note Details Patient Name: [name]. 01/15/2015 11:30 Date of Service: AM Medical Record [id number] Number: Patient Account Number: [id number] 1928/12/06 (81 y.o. Treating RN: Date of Birth/Sex: Female) Other  Clinician: Primary Care Physician: DOSS, CARRIE Treating Mckinsley Koelzer Referring Physician: Lorane Gell Physician/Extender: Suella Grove in Treatment: 1 Subjective Chief Complaint Information obtained from Patient Patient presents to the wound care center for a consult due non healing wound. pleasant 81 year old patient injured her left lower extremity against the dishwasher door about 6 weeks ago and then had a separate injury to her right forearm about 2 weeks ago. History of Present Illness (HPI) The following HPI elements were documented for the patient's wound: Location: left lower extremity and right forearm Quality: Patient reports experiencing a dull pain to affected area(s). Severity: Patient states wound (s) are getting better. Duration: Patient states the wound has been present for 6 and 2 weeks respectively Context: The wound occurred when the patient injured her leg on a dishwasher door and on the car door for her right forearm. Modifying Factors: Consults to this date include:urgent care for a right forearm 2 weeks ago when there was glue and Steri-Strips applied. Pleasant 81 year old who came with  a history of hitting her left leg on a dishwasher about 6 weeks ago. She had been initially applying Neosporin and some other local care and had not gotten well and hence was referred to the wound care center by her PCP. About 10 days ago she also skinned her right forearm when she was getting out of the car and does some skin off. She was seen at the urgent care and Steri- Strips were applied and was given some instructions for wound care. Past Medical History significant TKZ:SWFUXNATF;;TDDU (gastroesophageal reflux disease);Hypertension;Chronic kidney disease ;Vasculitis, ANCA positive, pulmonary hemorrhage;Thyroid disease;hyperthyroid, had radioactiveiodine treatment;Renal cell carcinoma 2003 s/p sgy for removal right kidney;Hemorrhoids. he has also had several surgeries including  abdominal hysterectomy with bilateral salpingo-oophorectomy, hernia repair, fracture of her femur on the right side, cholecystectomy, cardiac catheterization. she has been a nonsmoker all her life. Osland, Durant ([id number]) Objective Constitutional Pulse regular. Respirations normal and unlabored. Afebrile. Vitals Time Taken: 11:25 AM, Height: 65 in, Weight: 129.3 lbs, BMI: 21.5, Temperature: 97.8 F, Pulse: 54 bpm, Respiratory Rate: 18 breaths/min, Blood Pressure: 162/83 mmHg. Eyes Nonicteric. Reactive to light. Ears, Nose, Mouth, and Throat Lips, teeth, and gums WNL.Marland Kitchen Moist mucosa without lesions . Neck supple and nontender. No palpable supraclavicular or cervical adenopathy. Normal sized without goiter. Respiratory WNL. No retractions.. Cardiovascular Pedal Pulses WNL. No clubbing, cyanosis or edema. Musculoskeletal Adexa without tenderness or enlargement.. Digits and nails w/o clubbing, cyanosis, infection, petechiae, ischemia, or inflammatory conditions.Marland Kitchen Psychiatric Judgement and insight Intact.. No evidence of depression, anxiety, or agitation.. Integumentary (Hair, Skin) the wounds on her right upper extremity and left lower extremity look excellent. Very little open ulcerated area.. No crepitus or fluctuance. No peri-wound warmth or erythema. No masses.. Wound #1 status is Open. Original cause of wound was Trauma. The wound is located on the Right,Anterior Lower Leg. The wound measures 0.4cm length x 0.3cm width x 0.1cm depth; 0.094cm^2 area and 0.009cm^3 volume. The wound is limited to skin breakdown. There is a none present amount of drainage noted. The wound margin is indistinct and nonvisible. There is small (1-33%) red granulation within the wound bed. There is a small (1-33%) amount of necrotic tissue within the wound bed including Adherent Slough. The periwound skin appearance exhibited: Dry/Scaly, Mottled. The periwound skin appearance did not exhibit: Callus,  Crepitus, Excoriation, Fluctuance, Friable, Induration, Localized Edema, Rash, Scarring, Maceration, Moist, Atrophie Blanche, Cyanosis, Ecchymosis, Hemosiderin Staining, Pallor, Rubor, Erythema. Periwound temperature was noted as No Abnormality. Rock, Soma W. ([id number]) Wound #2 status is Open. Original cause of wound was Trauma. The wound is located on the Right,Posterior Forearm. The wound measures 1.6cm length x 1.8cm width x 0.1cm depth; 2.262cm^2 area and 0.226cm^3 volume. The wound is limited to skin breakdown. There is no tunneling or undermining noted. There is a small amount of drainage noted. The wound margin is indistinct and nonvisible. There is small (1-33%) pink granulation within the wound bed. There is a small (1-33%) amount of necrotic tissue within the wound bed including Eschar. The periwound skin appearance exhibited: Dry/Scaly, Ecchymosis. The periwound skin appearance did not exhibit: Callus, Crepitus, Excoriation, Fluctuance, Friable, Induration, Localized Edema, Rash, Scarring, Maceration, Moist, Atrophie Blanche, Cyanosis, Hemosiderin Staining, Mottled, Pallor, Rubor, Erythema. Periwound temperature was noted as No Abnormality. Assessment Active Problems ICD-10 S81.812A - Laceration without foreign body, left lower leg, initial encounter S41.111A - Laceration without foreign body of right upper arm, initial encounter I have recommended a piece of nonadherent dressing to both areas and a gauze  and Tegaderm dressing which she does not need to change for the entire week. She will come back and see as next week. Plan Wound Cleansing: Wound #1 Right,Anterior Lower Leg: Clean wound with Normal Saline. Wound #2 Right,Posterior Forearm: Clean wound with Normal Saline. Anesthetic: Wound #1 Right,Anterior Lower Leg: Topical Lidocaine 4% cream applied to wound bed prior to debridement Wound #2 Right,Posterior Forearm: Topical Lidocaine 4% cream applied to wound bed  prior to debridement Primary Wound Dressing: Wound #1 Right,Anterior Lower Leg: Dry Gauze Other: - mepitel one Wound #2 Right,Posterior Forearm: Dry Gauze Liles, Tayli W. ([id number]) Other: - mepitel one Secondary Dressing: Wound #1 Right,Anterior Lower Leg: Tegaderm Wound #2 Right,Posterior Forearm: Tegaderm Dressing Change Frequency: Wound #1 Right,Anterior Lower Leg: Change dressing every week Wound #2 Right,Posterior Forearm: Change dressing every week Follow-up Appointments: Wound #1 Right,Anterior Lower Leg: Return Appointment in 1 week. Wound #2 Right,Posterior Forearm: Return Appointment in 1 week. I have recommended a piece of nonadherent dressing to both areas and a gauze and Tegaderm dressing which she does not need to change for the entire week. She will come back and see as next week. Electronic Signature(s) Signed: 01/15/2015 12:20:11 PM By: Christin Fudge MD, FACS Entered By: Christin Fudge on 01/15/2015 11:42:26 Underberg, [name] ([id number]) -------------------------------------------------------------------------------- SuperBill Details Patient Name: [name] Date of Service: 01/15/2015 Medical Record Number: [id number] Patient Account Number: [id number] Date of Birth/Sex: 01-Feb-1929 (81 y.o. Female) Treating RN: Primary Care Physician: Lorane Gell Other Clinician: Referring Physician: Lorane Gell Treating Physician/Extender: Frann Rider in Treatment: 1 Diagnosis Coding ICD-10 Codes Code Description [id number] Laceration without foreign body, left lower leg, initial encounter S41.111A Laceration without foreign body of right upper arm, initial encounter Facility Procedures CPT4 Code: [id number] Description: [place] VISIT-LEV 3 EST PT Modifier: Quantity: 1 Physician Procedures CPT4 Code Description: [id number] [place] 3 - EST PT ICD-10 Description Diagnosis S81.812A Laceration without foreign body, left lower leg,  init S41.111A Laceration without foreign body of right upper arm, i Modifier: ial encounte nitial encou Quantity: 1 r nter Electronic Signature(s) Signed: 01/15/2015 12:13:32 PM By: Montey Hora Signed: 01/15/2015 12:20:11 PM By: Christin Fudge MD, FACS Entered By: Montey Hora on 01/15/2015 12:13:32

## 2015-01-15 NOTE — Telephone Encounter (Signed)
Call Dr. Clayborn Bigness since he is managing this and it was unclear why we changed the metoprolol- just d/c'd. Thanks!

## 2015-01-16 ENCOUNTER — Telehealth: Payer: Self-pay | Admitting: *Deleted

## 2015-01-16 NOTE — Telephone Encounter (Signed)
Notified patient as instructed, continue with her routine for BM (supp) Discussed follow-up appointments, patient agrees. The patient is aware to call back for any questions or concerns.

## 2015-01-16 NOTE — Telephone Encounter (Signed)
I talked with the patient and she is not having any pain. She has not had a BM since Friday. She has used miralax yesterday and today. She is afraid of using a suppository but feels she needs to. The bleeding is much better than in the past. On her pad the bleedng is less than a quarter in diameter all day.

## 2015-01-17 ENCOUNTER — Telehealth: Payer: Self-pay | Admitting: *Deleted

## 2015-01-17 NOTE — Telephone Encounter (Signed)
Patient called the office to report she has been using the suppositories as instructed.   This patient did not note any bleeding over the weekend but did see a little blood today.   Patient encouraged to keep follow up appointment as scheduled for next week.

## 2015-01-21 NOTE — Assessment & Plan Note (Addendum)
Pt had similar complaint in Feb. She was seen in April and had 24 holter monitor at Dr. Etta Quill office. Atrial flutter to blame. No Afib noted or ST changes. Pt could also be having GI complaints.  EKG- 1 PAC, no significant changes or acute concerns -Gas-x and miralax for now. Asked pt to f/u with [facility].

## 2015-01-22 ENCOUNTER — Encounter: Payer: Medicare Other | Admitting: Surgery

## 2015-01-22 DIAGNOSIS — S81812A Laceration without foreign body, left lower leg, initial encounter: Secondary | ICD-10-CM | POA: Diagnosis not present

## 2015-01-22 NOTE — Progress Notes (Signed)
DAKSHA, KOONE ([id number]) Visit Report for 01/22/2015 Arrival Information Details Patient Name: [name], [name] Date of Service: 01/22/2015 8:15 AM Medical Record Number: [id number] Patient Account Number: [id number] Date of Birth/Sex: 1929/02/02 (81 y.o. Female) Treating RN: Junious Dresser Primary Care Physician: Lorane Gell Other Clinician: Referring Physician: Lorane Gell Treating Physician/Extender: Frann Rider in Treatment: 2 Visit Information History Since Last Visit Added or deleted any medications: No Patient Arrived: Ambulatory Any new allergies or adverse reactions: No Arrival Time: 08:23 Had a fall or experienced change in No Accompanied By: self activities of daily living that may affect Transfer Assistance: None risk of falls: Patient Identification Verified: Yes Signs or symptoms of abuse/neglect since last No Secondary Verification Process Yes visito Completed: Hospitalized since last visit: No Patient Has Alerts: Yes Has Dressing in Place as Prescribed: Yes Patient Alerts: Patient on Blood Pain Present Now: No Thinner ASA 81mg  6/15 ABI L-1.34 R- 0.79 Electronic Signature(s) Signed: 01/22/2015 4:12:08 PM By: Junious Dresser RN Entered By: Junious Dresser on 01/22/2015 08:23:39 Cervini, [name] ([id number]) -------------------------------------------------------------------------------- Clinic Level of Care Assessment Details Patient Name: [name] Date of Service: 01/22/2015 8:15 AM Medical Record Number: [id number] Patient Account Number: [id number] Date of Birth/Sex: 12/11/1928 (81 y.o. Female) Treating RN: Junious Dresser Primary Care Physician: Lorane Gell Other Clinician: Referring Physician: Lorane Gell Treating Physician/Extender: Frann Rider in Treatment: 2 Clinic Level of Care Assessment Items TOOL 4 Quantity Score []  - Use when only an EandM is performed on FOLLOW-UP visit 0 ASSESSMENTS - Nursing Assessment /  Reassessment []  - Reassessment of Co-morbidities (includes updates in patient status) 0 []  - Reassessment of Adherence to Treatment Plan 0 ASSESSMENTS - Wound and Skin Assessment / Reassessment []  - Simple Wound Assessment / Reassessment - one wound 0 []  - Complex Wound Assessment / Reassessment - multiple wounds 0 []  - Dermatologic / Skin Assessment (not related to wound area) 0 ASSESSMENTS - Focused Assessment X - Circumferential Edema Measurements - multi extremities 1 5 []  - Nutritional Assessment / Counseling / Intervention 0 X - Lower Extremity Assessment (monofilament, tuning fork, pulses) 1 5 []  - Peripheral Arterial Disease Assessment (using hand held doppler) 0 ASSESSMENTS - Ostomy and/or Continence Assessment and Care []  - Incontinence Assessment and Management 0 []  - Ostomy Care Assessment and Management (repouching, etc.) 0 PROCESS - Coordination of Care X - Simple Patient / Family Education for ongoing care 1 15 []  - Complex (extensive) Patient / Family Education for ongoing care 0 []  - Staff obtains Programmer, systems, Records, Test Results / Process Orders 0 []  - Staff telephones HHA, Nursing Homes / Clarify orders / etc 0 []  - Routine Transfer to another Facility (non-emergent condition) 0 Ostrum, Shylo W. ([id number]) []  - Routine Hospital Admission (non-emergent condition) 0 []  - New Admissions / Biomedical engineer / Ordering NPWT, Apligraf, etc. 0 []  - Emergency Hospital Admission (emergent condition) 0 X - Simple Discharge Coordination 1 10 []  - Complex (extensive) Discharge Coordination 0 PROCESS - Special Needs []  - Pediatric / Minor Patient Management 0 []  - Isolation Patient Management 0 []  - Hearing / Language / Visual special needs 0 []  - Assessment of Community assistance (transportation, D/C planning, etc.) 0 []  - Additional assistance / Altered mentation 0 []  - Support Surface(s) Assessment (bed, cushion, seat, etc.) 0 INTERVENTIONS - Wound Cleansing /  Measurement []  - Simple Wound Cleansing - one wound 0 []  - Complex Wound Cleansing - multiple wounds 0 X - Wound Imaging (photographs - any number  of wounds) 1 5 []  - Wound Tracing (instead of photographs) 0 []  - Simple Wound Measurement - one wound 0 []  - Complex Wound Measurement - multiple wounds 0 INTERVENTIONS - Wound Dressings []  - Small Wound Dressing one or multiple wounds 0 []  - Medium Wound Dressing one or multiple wounds 0 []  - Large Wound Dressing one or multiple wounds 0 []  - Application of Medications - topical 0 []  - Application of Medications - injection 0 INTERVENTIONS - Miscellaneous []  - External ear exam 0 Diviney, Kriste W. ([id number]) []  - Specimen Collection (cultures, biopsies, blood, body fluids, etc.) 0 []  - Specimen(s) / Culture(s) sent or taken to Lab for analysis 0 []  - Patient Transfer (multiple staff / Harrel Lemon Lift / Similar devices) 0 []  - Simple Staple / Suture removal (25 or less) 0 []  - Complex Staple / Suture removal (26 or more) 0 []  - Hypo / Hyperglycemic Management (close monitor of Blood Glucose) 0 []  - Ankle / Brachial Index (ABI) - do not check if billed separately 0 X - Vital Signs 1 5 Has the patient been seen at the hospital within the last three years: Yes Total Score: 45 Level Of Care: New/Established - Level 2 Electronic Signature(s) Signed: 01/22/2015 4:12:08 PM By: Junious Dresser RN Entered By: Junious Dresser on 01/22/2015 08:38:51 Pfeifer, [name] ([id number]) -------------------------------------------------------------------------------- Encounter Discharge Information Details Patient Name: [name] Date of Service: 01/22/2015 8:15 AM Medical Record Number: [id number] Patient Account Number: [id number] Date of Birth/Sex: 25-Dec-1928 (81 y.o. Female) Treating RN: Primary Care Physician: Lorane Gell Other Clinician: Referring Physician: Lorane Gell Treating Physician/Extender: Frann Rider in Treatment: 2 Encounter  Discharge Information Items Schedule Follow-up Appointment: No Medication Reconciliation completed No and provided to Patient/Care Roper Tolson: Provided on Clinical Summary of Care: 01/22/2015 Form Type Recipient Paper Patient NB Electronic Signature(s) Signed: 01/22/2015 8:47:14 AM By: Ruthine Dose Entered By: Ruthine Dose on 01/22/2015 08:47:14 Mohiuddin, [name] ([id number]) -------------------------------------------------------------------------------- Lower Extremity Assessment Details Patient Name: [name] Date of Service: 01/22/2015 8:15 AM Medical Record Number: [id number] Patient Account Number: [id number] Date of Birth/Sex: [birth year] (81 y.o. Female) Treating RN: Junious Dresser Primary Care Physician: Lorane Gell Other Clinician: Referring Physician: Lorane Gell Treating Physician/Extender: Frann Rider in Treatment: 2 Edema Assessment Assessed: [Left: Yes] [Right: No] Edema: [Left: Ye] [Right: s] Calf Left: Right: Point of Measurement: 31 cm From Medial Instep 33.2 cm cm Ankle Left: Right: Point of Measurement: 12 cm From Medial Instep 21 cm cm Vascular Assessment Claudication: Claudication Assessment [Left:None] Pulses: Posterior Tibial Palpable: [Left:Yes] Dorsalis Pedis Palpable: [Left:Yes] Extremity colors, hair growth, and conditions: Extremity Color: [Left:Normal] Hair Growth on Extremity: [Left:Yes] Temperature of Extremity: [Left:Warm] Capillary Refill: [Left:< 3 seconds] Dependent Rubor: [Left:No] Blanched when Elevated: [Left:No] Toe Nail Assessment Left: Right: Thick: Yes Discolored: Yes Deformed: No Improper Length and Hygiene: No NAVEA, WOODROW ([id number]) Electronic Signature(s) Signed: 01/22/2015 4:12:08 PM By: Junious Dresser RN Entered By: Junious Dresser on 01/22/2015 08:31:54 Russell, [name] ([id number]) -------------------------------------------------------------------------------- Multi Wound Chart  Details Patient Name: [name] Date of Service: 01/22/2015 8:15 AM Medical Record Number: [id number] Patient Account Number: [id number] Date of Birth/Sex: 09-01-1928 (81 y.o. Female) Treating RN: Junious Dresser Primary Care Physician: Lorane Gell Other Clinician: Referring Physician: Lorane Gell Treating Physician/Extender: Frann Rider in Treatment: 2 Vital Signs Height(in): 65 Pulse(bpm): 65 Weight(lbs): 129.3 Blood Pressure 130/65 (mmHg): Body Mass Index(BMI): 22 Temperature(F): 98.0 Respiratory Rate 20 (breaths/min): Photos: [1:No Photos] [2:No Photos] [N/A:N/A] Wound Location: [1:Right Lower  Leg - Anterior Right Forearm - Posterior] [N/A:N/A] Wounding Event: [1:Trauma] [2:Trauma] [N/A:N/A] Primary Etiology: [1:Trauma, Other] [2:Trauma, Other] [N/A:N/A] Comorbid History: [1:Cataracts, Chronic sinus Cataracts, Chronic sinus problems/congestion, Angina, Hypertension, Vasculitis, Osteoarthritis Vasculitis, Osteoarthritis] [2:problems/congestion, Angina, Hypertension,] [N/A:N/A] Date Acquired: [1:12/08/2014] [2:12/25/2014] [N/A:N/A] Weeks of Treatment: [1:2] [2:2] [N/A:N/A] Wound Status: [1:Open] [2:Open] [N/A:N/A] Measurements L x W x D 0x0x0 [2:1.6x1.5x0.1] [N/A:N/A] (cm) Area (cm) : [1:0] [2:1.885] [N/A:N/A] Volume (cm) : [1:0] [2:0.188] [N/A:N/A] % Reduction in Area: [1:100.00%] [2:71.60%] [N/A:N/A] % Reduction in Volume: 100.00% [2:71.70%] [N/A:N/A] Classification: [1:Full Thickness Without Exposed Support Structures] [2:Full Thickness Without Exposed Support Structures] [N/A:N/A] Exudate Amount: [1:None Present] [2:None Present] [N/A:N/A] Wound Margin: [1:N/A] [2:Indistinct, nonvisible] [N/A:N/A] Granulation Amount: [1:None Present (0%)] [2:None Present (0%)] [N/A:N/A] Necrotic Amount: [1:None Present (0%)] [2:Small (1-33%)] [N/A:N/A] Necrotic Tissue: [1:N/A] [2:Eschar] [N/A:N/A] Exposed Structures: [1:Fascia: No Fat: No Tendon: No Muscle: No Joint:  No] [2:Fascia: No Fat: No Tendon: No Muscle: No Joint: No] [N/A:N/A] Bone: No Bone: No Limited to Skin Limited to Skin Breakdown Breakdown Epithelialization: Large (67-100%) Large (67-100%) N/A Periwound Skin Texture: Edema: No Edema: No N/A Excoriation: No Excoriation: No Induration: No Induration: No Callus: No Callus: No Crepitus: No Crepitus: No Fluctuance: No Fluctuance: No Friable: No Friable: No Rash: No Rash: No Scarring: No Scarring: No Periwound Skin No Abnormalities Noted Dry/Scaly: Yes N/A Moisture: Maceration: No Moist: No Periwound Skin Color: Mottled: Yes Ecchymosis: Yes N/A Atrophie Blanche: No Atrophie Blanche: No Cyanosis: No Cyanosis: No Ecchymosis: No Erythema: No Erythema: No Hemosiderin Staining: No Hemosiderin Staining: No Mottled: No Pallor: No Pallor: No Rubor: No Rubor: No Temperature: No Abnormality No Abnormality N/A Tenderness on No No N/A Palpation: Wound Preparation: Ulcer Cleansing: Ulcer Cleansing: N/A Rinsed/Irrigated with Rinsed/Irrigated with Saline Saline Topical Anesthetic Topical Anesthetic Applied: None Applied: Other: Lidocaine 4% Ointment Treatment Notes Electronic Signature(s) Signed: 01/22/2015 4:12:08 PM By: Junious Dresser RN Entered By: Junious Dresser on 01/22/2015 08:34:09 Lacap, [name] ([id number]) -------------------------------------------------------------------------------- [place] Details Patient Name: [name] Date of Service: 01/22/2015 8:15 AM Medical Record Number: [id number] Patient Account Number: [id number] Date of Birth/Sex: 02/05/1929 (81 y.o. Female) Treating RN: Junious Dresser Primary Care Physician: Lorane Gell Other Clinician: Referring Physician: Lorane Gell Treating Physician/Extender: Frann Rider in Treatment: 2 Active Inactive Electronic Signature(s) Signed: 01/22/2015 4:12:08 PM By: Junious Dresser RN Entered By: Junious Dresser on 01/22/2015  08:39:03 Mcclanahan, [name] ([id number]) -------------------------------------------------------------------------------- Pain Assessment Details Patient Name: [name] Date of Service: 01/22/2015 8:15 AM Medical Record Number: [id number] Patient Account Number: [id number] Date of Birth/Sex: [birth year] (81 y.o. Female) Treating RN: Junious Dresser Primary Care Physician: Lorane Gell Other Clinician: Referring Physician: Lorane Gell Treating Physician/Extender: Frann Rider in Treatment: 2 Active Problems Location of Pain Severity and Description of Pain Patient Has Paino No Site Locations Pain Management and Medication Current Pain Management: Electronic Signature(s) Signed: 01/22/2015 4:12:08 PM By: Junious Dresser RN Entered By: Junious Dresser on 01/22/2015 08:23:43 [place]inton, [name] ([id number]) -------------------------------------------------------------------------------- Patient/Caregiver Education Details Patient Name: [name] Date of Service: 01/22/2015 8:15 AM Medical Record Number: [id number] Patient Account Number: [id number] Date of Birth/Gender: 09/21/1928 (81 y.o. Female) Treating RN: Junious Dresser Primary Care Physician: Lorane Gell Other Clinician: Referring Physician: Lorane Gell Treating Physician/Extender: Frann Rider in Treatment: 2 Education Assessment Education Provided To: Patient Education Topics Provided Basic Hygiene: Methods: Explain/Verbal Responses: State content correctly Wound/Skin Impairment: Methods: Explain/Verbal Responses: State content correctly Electronic Signature(s) Signed: 01/22/2015 4:12:08 PM By: Junious Dresser RN Entered By: Connye Burkitt  Kelsey on 01/22/2015 08:56:26 Welford, [name] ([id number]) -------------------------------------------------------------------------------- Wound Assessment Details Patient Name: MINAH, AXELROD Date of Service: 01/22/2015 8:15 AM Medical Record Number:  [id number] Patient Account Number: [id number] Date of Birth/Sex: [birth year] (81 y.o. Female) Treating RN: Junious Dresser Primary Care Physician: Lorane Gell Other Clinician: Referring Physician: Lorane Gell Treating Physician/Extender: Frann Rider in Treatment: 2 Wound Status Wound Number: 1 Primary Trauma, Other Etiology: Wound Location: Right Lower Leg - Anterior Wound Open Wounding Event: Trauma Status: Date Acquired: 12/08/2014 Comorbid Cataracts, Chronic sinus Weeks Of Treatment: 2 History: problems/congestion, Angina, Clustered Wound: No Hypertension, Vasculitis, Osteoarthritis Photos Photo Uploaded By: Gretta Cool, RN, BSN, Kim on 01/22/2015 14:36:54 Wound Measurements Length: (cm) 0 % Reduction Width: (cm) 0 % Reduction Depth: (cm) 0 Epithelializ Area: (cm) 0 Tunneling: Volume: (cm) 0 Undermining in Area: 100% in Volume: 100% ation: Large (67-100%) No : No Wound Description Full Thickness Without Exposed Classification: Support Structures Exudate None Present Amount: Foul Odor After Cleansing: No Wound Bed Granulation Amount: None Present (0%) Exposed Structure Necrotic Amount: None Present (0%) Fascia Exposed: No Fat Layer Exposed: No Tendon Exposed: No Muscle Exposed: No Armond, Sydne W. ([id number]) Joint Exposed: No Bone Exposed: No Limited to Skin Breakdown Periwound Skin Texture Texture Color No Abnormalities Noted: No No Abnormalities Noted: No Callus: No Atrophie Blanche: No Crepitus: No Cyanosis: No Excoriation: No Ecchymosis: No Fluctuance: No Erythema: No Friable: No Hemosiderin Staining: No Induration: No Mottled: Yes Localized Edema: No Pallor: No Rash: No Rubor: No Scarring: No Temperature / Pain Moisture Temperature: No Abnormality No Abnormalities Noted: Yes Wound Preparation Ulcer Cleansing: Rinsed/Irrigated with Saline Topical Anesthetic Applied: None Electronic Signature(s) Signed: 01/22/2015 4:12:08 PM By:  Junious Dresser RN Entered By: Junious Dresser on 01/22/2015 08:33:38 Flewellen, [name] ([id number]) -------------------------------------------------------------------------------- Wound Assessment Details Patient Name: [name] Date of Service: 01/22/2015 8:15 AM Medical Record Number: [id number] Patient Account Number: [id number] Date of Birth/Sex: [birth year] (81 y.o. Female) Treating RN: Junious Dresser Primary Care Physician: Lorane Gell Other Clinician: Referring Physician: Lorane Gell Treating Physician/Extender: Frann Rider in Treatment: 2 Wound Status Wound Number: 2 Primary Trauma, Other Etiology: Wound Location: Right Forearm - Posterior Wound Healed - Epithelialized Wounding Event: Trauma Status: Date Acquired: 12/25/2014 Comorbid Cataracts, Chronic sinus Weeks Of Treatment: 2 History: problems/congestion, Angina, Clustered Wound: No Hypertension, Vasculitis, Osteoarthritis Photos Photo Uploaded By: Gretta Cool, RN, BSN, Kim on 01/22/2015 14:36:55 Wound Measurements Length: (cm) 0 % Reduction Width: (cm) 0 % Reduction Depth: (cm) 0 Epithelializ Area: (cm) 0 Tunneling: Volume: (cm) 0 Undermining in Area: 100% in Volume: 100% ation: Large (67-100%) No : No Wound Description Full Thickness Without Exposed Classification: Support Structures Wound Margin: Indistinct, nonvisible None Present Dorning, Kamry W. ([id number]) Foul Odor After Cleansing: No Exudate Amount: Wound Bed Granulation Amount: None Present (0%) Exposed Structure Necrotic Amount: None Present (0%) Fascia Exposed: No Fat Layer Exposed: No Tendon Exposed: No Muscle Exposed: No Joint Exposed: No Bone Exposed: No Limited to Skin Breakdown Periwound Skin Texture Texture Color No Abnormalities Noted: No No Abnormalities Noted: No Callus: No Atrophie Blanche: No Crepitus: No Cyanosis: No Excoriation: No Ecchymosis: Yes Fluctuance: No Erythema: No Friable: No Hemosiderin  Staining: No Induration: No Mottled: No Localized Edema: No Pallor: No Rash: No Rubor: No Scarring: No Temperature / Pain Moisture Temperature: No Abnormality No Abnormalities Noted: No Dry / Scaly: Yes Maceration: No Moist: No Wound Preparation Ulcer Cleansing: Rinsed/Irrigated with Saline Topical Anesthetic Applied: Other: Lidocaine 4% Ointment , Assessment Notes small scabbed areas  noted Electronic Signature(s) Signed: 01/22/2015 4:12:08 PM By: Junious Dresser RN Entered By: Junious Dresser on 01/22/2015 08:37:42 Hopping, [name] ([id number]) -------------------------------------------------------------------------------- Vitals Details Patient Name: [name] Date of Service: 01/22/2015 8:15 AM Medical Record Number: [id number] Patient Account Number: [id number] Date of Birth/Sex: May 25, 1929 (81 y.o. Female) Treating RN: Junious Dresser Primary Care Physician: Lorane Gell Other Clinician: Referring Physician: Lorane Gell Treating Physician/Extender: Frann Rider in Treatment: 2 Vital Signs Time Taken: 08:20 Temperature (F): 98.0 Height (in): 65 Pulse (bpm): 65 Weight (lbs): 129.3 Respiratory Rate (breaths/min): 20 Body Mass Index (BMI): 21.5 Blood Pressure (mmHg): 130/65 Reference Range: 80 - 120 mg / dl Electronic Signature(s) Signed: 01/22/2015 4:12:08 PM By: Junious Dresser RN Entered By: Junious Dresser on 01/22/2015 08:23:58

## 2015-01-22 NOTE — Progress Notes (Signed)
CORALEE, EDBERG ([id number]) Visit Report for 01/22/2015 Chief Complaint Document Details Patient Name: [name], [name] Date of Service: 01/22/2015 8:15 AM Medical Record Number: [id number] Patient Account Number: [id number] Date of Birth/Sex: 1929/05/26 (81 y.o. Female) Treating RN: Primary Care Physician: Lorane Gell Other Clinician: Referring Physician: Lorane Gell Treating Physician/Extender: Frann Rider in Treatment: 2 Information Obtained from: Patient Chief Complaint Patient presents to the wound care center for a consult due non healing wound. pleasant 81 year old patient injured her left lower extremity against the dishwasher door about 6 weeks ago and then had a separate injury to her right forearm about 2 weeks ago. Electronic Signature(s) Signed: 01/22/2015 12:15:23 PM By: Christin Fudge MD, FACS Entered By: Christin Fudge on 01/22/2015 08:43:22 [name], [name] ([id number]) -------------------------------------------------------------------------------- HPI Details Patient Name: [name] Date of Service: 01/22/2015 8:15 AM Medical Record Number: [id number] Patient Account Number: [id number] Date of Birth/Sex: [birth year] (81 y.o. Female) Treating RN: Primary Care Physician: Lorane Gell Other Clinician: Referring Physician: Lorane Gell Treating Physician/Extender: Frann Rider in Treatment: 2 History of Present Illness Location: left lower extremity and right forearm Quality: Patient reports experiencing a dull pain to affected area(s). Severity: Patient states wound (s) are getting better. Duration: Patient states the wound has been present for 6 and 2 weeks respectively Context: The wound occurred when the patient injured her leg on a dishwasher door and on the car door for her right forearm. Modifying Factors: Consults to this date include:urgent care for a right forearm 2 weeks ago when there was glue and Steri-Strips applied. HPI  Description: Pleasant 81 year old who came with a history of hitting her left leg on a dishwasher about 6 weeks ago. She had been initially applying Neosporin and some other local care and had not gotten well and hence was referred to the wound care center by her PCP. About 10 days ago she also skinned her right forearm when she was getting out of the car and does some skin off. She was seen at the urgent care and Steri-Strips were applied and was given some instructions for wound care. Past Medical Historyosignificant LZJ:[id number];;KWIO (gastroesophageal reflux disease);Hypertension;Chronic kidney diseaseo;Vasculitis, ANCA positive, pulmonary hemorrhage;Thyroid disease;hyperthyroid, had radioactiveiodine treatment;Renal cell carcinoma 2003 s/p sgy for removal right kidney;Hemorrhoids.o he has also had several surgeries including abdominal hysterectomy with bilateral salpingo-oophorectomy, hernia repair, fracture of her femur on the right side, cholecystectomy, cardiac catheterization. she has been a nonsmoker all her life. Electronic Signature(s) Signed: 01/22/2015 12:15:23 PM By: Christin Fudge MD, FACS Entered By: Christin Fudge on 01/22/2015 08:43:25 [name], [name] ([id number]) -------------------------------------------------------------------------------- Physical Exam Details Patient Name: [name] Date of Service: 01/22/2015 8:15 AM Medical Record Number: [id number] Patient Account Number: [id number] Date of Birth/Sex: 1928/09/11 (81 y.o. Female) Treating RN: Primary Care Physician: Lorane Gell Other Clinician: Referring Physician: Lorane Gell Treating Physician/Extender: Frann Rider in Treatment: 2 Constitutional . Pulse regular. Respirations normal and unlabored. Afebrile. . Eyes Nonicteric. Reactive to light. Ears, Nose, Mouth, and Throat Lips, teeth, and gums WNL.Marland Kitchen Moist mucosa without lesions . Neck supple and nontender. No palpable supraclavicular or  cervical adenopathy. Normal sized without goiter. Respiratory WNL. No retractions.. Cardiovascular Pedal Pulses WNL. No clubbing, cyanosis or edema. Musculoskeletal Adexa without tenderness or enlargement.. Digits and nails w/o clubbing, cyanosis, infection, petechiae, ischemia, or inflammatory conditions.. Integumentary (Hair, Skin) No suspicious lesions. the wounds have completely healed.. No crepitus or fluctuance. No peri-wound warmth or erythema. No masses.Marland Kitchen Psychiatric Judgement and insight Intact.. No evidence of depression,  anxiety, or agitation.. Electronic Signature(s) Signed: 01/22/2015 12:15:23 PM By: Christin Fudge MD, FACS Entered By: Christin Fudge on 01/22/2015 08:43:59 [name], [name] ([id number]) -------------------------------------------------------------------------------- Physician Orders Details Patient Name: [name] Date of Service: 01/22/2015 8:15 AM Medical Record Number: [id number] Patient Account Number: [id number] Date of Birth/Sex: [birth year] (81 y.o. Female) Treating RN: Junious Dresser Primary Care Physician: Lorane Gell Other Clinician: Referring Physician: Lorane Gell Treating Physician/Extender: Frann Rider in Treatment: 2 Verbal / Phone Orders: Yes Clinician: Junious Dresser Read Back and Verified: Yes Diagnosis Coding Skin Barriers/Peri-Wound Care o Moisturizing lotion Discharge From [place] o Discharge from [place] Signature(s) Signed: 01/22/2015 12:15:23 PM By: Christin Fudge MD, FACS Signed: 01/22/2015 4:12:08 PM By: Junious Dresser RN Entered By: Junious Dresser on 01/22/2015 08:38:27 [name], [name] ([id number]) -------------------------------------------------------------------------------- Problem List Details Patient Name: [name] Date of Service: 01/22/2015 8:15 AM Medical Record Number: [id number] Patient Account Number: [id number] Date of Birth/Sex: 07/29/1929 (81 y.o.  Female) Treating RN: Primary Care Physician: Lorane Gell Other Clinician: Referring Physician: Lorane Gell Treating Physician/Extender: Frann Rider in Treatment: 2 Active Problems ICD-10 Encounter Code Description Active Date Diagnosis S81.812A Laceration without foreign body, left lower leg, initial 01/08/2015 Yes encounter S41.111A Laceration without foreign body of right upper arm, initial 01/08/2015 Yes encounter Inactive Problems Resolved Problems Electronic Signature(s) Signed: 01/22/2015 12:15:23 PM By: Christin Fudge MD, FACS Entered By: Christin Fudge on 01/22/2015 08:43:14 [name], [name] ([id number]) -------------------------------------------------------------------------------- Progress Note Details Patient Name: [name] Date of Service: 01/22/2015 8:15 AM Medical Record Number: [id number] Patient Account Number: [id number] Date of Birth/Sex: 26-Jun-1929 (81 y.o. Female) Treating RN: Primary Care Physician: Lorane Gell Other Clinician: Referring Physician: Lorane Gell Treating Physician/Extender: Frann Rider in Treatment: 2 Subjective Chief Complaint Information obtained from Patient Patient presents to the wound care center for a consult due non healing wound. pleasant 81 year old patient injured her left lower extremity against the dishwasher door about 6 weeks ago and then had a separate injury to her right forearm about 2 weeks ago. History of Present Illness (HPI) The following HPI elements were documented for the patient's wound: Location: left lower extremity and right forearm Quality: Patient reports experiencing a dull pain to affected area(s). Severity: Patient states wound (s) are getting better. Duration: Patient states the wound has been present for 6 and 2 weeks respectively Context: The wound occurred when the patient injured her leg on a dishwasher door and on the car door for her right forearm. Modifying Factors:  Consults to this date include:urgent care for a right forearm 2 weeks ago when there was glue and Steri-Strips applied. Pleasant 80 year old who came with a history of hitting her left leg on a dishwasher about 6 weeks ago. She had been initially applying Neosporin and some other local care and had not gotten well and hence was referred to the wound care center by her PCP. About 10 days ago she also skinned her right forearm when she was getting out of the car and does some skin off. She was seen at the urgent care and Steri- Strips were applied and was given some instructions for wound care. Past Medical History significant BRA:[id number];;SUPJ (gastroesophageal reflux disease);Hypertension;Chronic kidney disease ;Vasculitis, ANCA positive, pulmonary hemorrhage;Thyroid disease;hyperthyroid, had radioactiveiodine treatment;Renal cell carcinoma 2003 s/p sgy for removal right kidney;Hemorrhoids. he has also had several surgeries including abdominal hysterectomy with bilateral salpingo-oophorectomy, hernia repair, fracture of her femur on the right side, cholecystectomy, cardiac catheterization. she has been a  nonsmoker all her life. [name], [name] ([id number]) Objective Constitutional Pulse regular. Respirations normal and unlabored. Afebrile. Vitals Time Taken: 8:20 AM, Height: 65 in, Weight: 129.3 lbs, BMI: 21.5, Temperature: 98.0 F, Pulse: 65 bpm, Respiratory Rate: 20 breaths/min, Blood Pressure: 130/65 mmHg. Eyes Nonicteric. Reactive to light. Ears, Nose, Mouth, and Throat Lips, teeth, and gums WNL.Marland Kitchen Moist mucosa without lesions . Neck supple and nontender. No palpable supraclavicular or cervical adenopathy. Normal sized without goiter. Respiratory WNL. No retractions.. Cardiovascular Pedal Pulses WNL. No clubbing, cyanosis or edema. Musculoskeletal Adexa without tenderness or enlargement.. Digits and nails w/o clubbing, cyanosis, infection, petechiae, ischemia, or inflammatory  conditions.Marland Kitchen Psychiatric Judgement and insight Intact.. No evidence of depression, anxiety, or agitation.. Integumentary (Hair, Skin) No suspicious lesions. the wounds have completely healed.. No crepitus or fluctuance. No peri-wound warmth or erythema. No masses.. Wound #1 status is Open. Original cause of wound was Trauma. The wound is located on the Right,Anterior Lower Leg. The wound measures 0cm length x 0cm width x 0cm depth; 0cm^2 area and 0cm^3 volume. The wound is limited to skin breakdown. There is no tunneling or undermining noted. There is a none present amount of drainage noted. There is no granulation within the wound bed. There is no necrotic tissue within the wound bed. The periwound skin appearance had no abnormalities noted for moisture. The periwound skin appearance exhibited: Mottled. The periwound skin appearance did not exhibit: Callus, Crepitus, Excoriation, Fluctuance, Friable, Induration, Localized Edema, Rash, Scarring, Atrophie Blanche, Cyanosis, Ecchymosis, Hemosiderin Staining, Pallor, Rubor, Erythema. Periwound temperature was noted as No Abnormality. Wound #2 status is Healed - Epithelialized. Original cause of wound was Trauma. The wound is located on the Right,Posterior Forearm. The wound measures 0cm length x 0cm width x 0cm depth; 0cm^2 area and [name], Suzanne W. ([id number]) 0cm^3 volume. The wound is limited to skin breakdown. There is no tunneling or undermining noted. There is a none present amount of drainage noted. The wound margin is indistinct and nonvisible. There is no granulation within the wound bed. There is no necrotic tissue within the wound bed. The periwound skin appearance exhibited: Dry/Scaly, Ecchymosis. The periwound skin appearance did not exhibit: Callus, Crepitus, Excoriation, Fluctuance, Friable, Induration, Localized Edema, Rash, Scarring, Maceration, Moist, Atrophie Blanche, Cyanosis, Hemosiderin Staining, Mottled, Pallor, Rubor,  Erythema. Periwound temperature was noted as No Abnormality. General Notes: small scabbed areas noted Assessment Active Problems ICD-10 M84.132G - Laceration without foreign body, left lower leg, initial encounter S41.111A - Laceration without foreign body of right upper arm, initial encounter Her right arm and her left lower extremity wounds have completely healed and she has no open ulcerations. Care of her skin is discussed and she is discharged from the wound care services and will be seen back on a when necessary basis. Plan Skin Barriers/Peri-Wound Care: Moisturizing lotion Discharge From [facility] Services: Discharge from [place] Her right arm and her left lower extremity wounds have completely healed and she has no open ulcerations. Care of her skin is discussed and she is discharged from the wound care services and will be seen back on a when necessary basis. [name], [name] ([id number]) Electronic Signature(s) Signed: 01/22/2015 12:15:23 PM By: Christin Fudge MD, FACS Entered By: Christin Fudge on 01/22/2015 08:44:58 [name], [name] ([id number]) -------------------------------------------------------------------------------- SuperBill Details Patient Name: [name] Date of Service: 01/22/2015 Medical Record Number: [id number] Patient Account Number: [id number] Date of Birth/Sex: Dec 01, 1928 (81 y.o. Female) Treating RN: Primary Care Physician: Lorane Gell Other Clinician: Referring Physician: Lorane Gell  Treating Physician/Extender: Frann Rider in Treatment: 2 Diagnosis Coding ICD-10 Codes Code Description [phone] Laceration without foreign body, left lower leg, initial encounter S41.111A Laceration without foreign body of right upper arm, initial encounter Facility Procedures CPT4 Code: [id number] Description: [phone] - WOUND CARE VISIT-LEV 2 EST PT Modifier: Quantity: 1 Physician Procedures CPT4 Code Description: [id number] - WC PHYS LEVEL  3 - EST PT ICD-10 Description Diagnosis S81.812A Laceration without foreign body, left lower leg, init S41.111A Laceration without foreign body of right upper arm, i Modifier: ial encounte nitial encou Quantity: 1 r nter Electronic Signature(s) Signed: 01/22/2015 12:15:23 PM By: Christin Fudge MD, FACS Entered By: Christin Fudge on 01/22/2015 08:45:15

## 2015-01-24 ENCOUNTER — Ambulatory Visit (INDEPENDENT_AMBULATORY_CARE_PROVIDER_SITE_OTHER): Payer: Medicare Other | Admitting: General Surgery

## 2015-01-24 ENCOUNTER — Encounter: Payer: Self-pay | Admitting: General Surgery

## 2015-01-24 VITALS — BP 120/68 | HR 73 | Resp 13 | Ht 65.0 in | Wt 130.0 lb

## 2015-01-24 DIAGNOSIS — K649 Unspecified hemorrhoids: Secondary | ICD-10-CM | POA: Diagnosis not present

## 2015-01-24 NOTE — Progress Notes (Signed)
Patient ID: [name], female   DOB: [birth year], 80 y.o.   MRN: [id number]  Chief Complaint  Patient presents with  . Follow-up    hemorrhoid banding    HPI [name] is a 81 y.o. female here for a follow up from hemorrhoid banding done on 01/11/15. She reports she had bleeding on 6/17 and 6/18. She laid  down on her side and it stopped after 15 minutes. She reports she has mild bleeding after a bowel movement. She reports no pain.  HPI  Past Medical History  Diagnosis Date  . Arthritis   . GERD (gastroesophageal reflux disease)   . Hypertension   . Chronic kidney disease   . Colon polyp   . UTI (lower urinary tract infection)   . Vasculitis     ANCA positive, pulmonary hemorrhage  . Thyroid disease 1957    hyperthyroid, had radioactiveiodine treatment   . Renal cell carcinoma 2003    s/p sgy for removal right kidney  . Hemorrhoids     Past Surgical History  Procedure Laterality Date  . Abdominal hysterectomy    . Bilateral salpingoophorectomy    . Hernia repair    . Fracture surgery Right     femur fracture  . Cholecystectomy  1957  . Nose surgery  2000  . Incontinence surgery  2006  . Cardiac catheterization  2008  . Colonoscopy w/ biopsies  May 27, 2011,01/10/14    tubular adenoma in the ascending colon and descending colon. Tubulovillous adenoma in the upper rectum 12 mm. Diverticulosis.  Marland Kitchen Upper gi endoscopy  May 30, 2004    large hiatal hernia, duodenal diverticulum.    Family History  Problem Relation Age of Onset  . Arthritis Mother   . Heart disease Mother   . Heart disease Father   . Heart disease Sister     CAD  . Hypertension Sister     Social History History  Substance Use Topics  . Smoking status: Never Smoker   . Smokeless tobacco: Never Used  . Alcohol Use: No    Allergies  Allergen Reactions  . Amoxicillin Other (See Comments)    Hand tingling   . Nsaids   . Tolmetin Other (See Comments)    Cannot take because of  Kidneys    Current Outpatient Prescriptions  Medication Sig Dispense Refill  . amLODipine (NORVASC) 5 MG tablet Take 5 mg by mouth daily.     Marland Kitchen aspirin 81 MG tablet Take 81 mg by mouth daily.    . cholecalciferol (VITAMIN D) 1000 UNITS tablet Take 1,000 Units by mouth daily.    Marland Kitchen CRANBERRY PO Take 84 mg by mouth daily.    . ferrous sulfate 325 (65 FE) MG tablet Take by mouth 3 (three) times daily with meals.     . hydrocortisone-pramoxine (ANALPRAM HC) 2.5-1 % rectal cream Place 1 application rectally 3 (three) times daily. 30 g 2  . lisinopril (PRINIVIL,ZESTRIL) 10 MG tablet Take 10 mg by mouth daily.     . metoprolol tartrate (LOPRESSOR) 25 MG tablet Take 12.5 mg by mouth 2 (two) times daily.    . Multiple Vitamins-Minerals (CENTRUM SILVER PO) Take by mouth.    . Multiple Vitamins-Minerals (IMMUNE SYSTEM BOOSTER PO) Take by mouth. Ambatrose (Immune System) 3 tabs daily    . Omega-3 Fatty Acids (FISH OIL) 600 MG CAPS Take 1 capsule by mouth daily.    Marland Kitchen OVER THE COUNTER MEDICATION Take 3 tablets by mouth daily.  Real coral calcium    . polyethylene glycol (MIRALAX / GLYCOLAX) packet Take 17 g by mouth daily.    . predniSONE (DELTASONE) 5 MG tablet Take 5 mg by mouth daily.    . Probiotic Product (PROBIOTIC DAILY PO) Take 1 tablet by mouth daily.    . Turmeric 500 MG CAPS Take 2 capsules by mouth daily.    . vitamin C (ASCORBIC ACID) 500 MG tablet Take 500 mg by mouth daily.     No current facility-administered medications for this visit.    Review of Systems Review of Systems  Constitutional: Negative.   Respiratory: Negative.   Cardiovascular: Negative.     Blood pressure 120/68, pulse 73, resp. rate 13, height 5\' 5"  (1.651 m), weight 130 lb (58.968 kg).  Physical Exam Physical Exam  Constitutional: She is oriented to person, place, and time. She appears well-developed and well-nourished.  Genitourinary: Rectum normal.     Neurological: She is alert and oriented to person,  place, and time.  Skin: Skin is warm and dry.       Assessment    Doing well status post hemorrhoid ligation.    Plan    The patient will continue to use stool softeners. She report if she has recurrent symptomatic bleeding. Follow up otherwise will be on an as-needed basis.    PCP: Carolin Coy 01/25/2015, 7:58 PM

## 2015-01-24 NOTE — Patient Instructions (Signed)
Call with any problems

## 2015-01-31 ENCOUNTER — Other Ambulatory Visit: Payer: Self-pay | Admitting: Nurse Practitioner

## 2015-01-31 ENCOUNTER — Ambulatory Visit
Admission: RE | Admit: 2015-01-31 | Discharge: 2015-01-31 | Disposition: A | Discharge status: Home / Self Care | Payer: Medicare Other | Source: Ambulatory Visit | Attending: Nurse Practitioner | Admitting: Nurse Practitioner

## 2015-01-31 DIAGNOSIS — Z1239 Encounter for other screening for malignant neoplasm of breast: Secondary | ICD-10-CM

## 2015-01-31 DIAGNOSIS — Z1231 Encounter for screening mammogram for malignant neoplasm of breast: Secondary | ICD-10-CM | POA: Insufficient documentation

## 2015-02-22 ENCOUNTER — Encounter: Payer: Self-pay | Admitting: Nurse Practitioner

## 2015-02-22 ENCOUNTER — Ambulatory Visit (INDEPENDENT_AMBULATORY_CARE_PROVIDER_SITE_OTHER): Payer: Medicare Other | Admitting: Nurse Practitioner

## 2015-02-22 VITALS — BP 148/88 | HR 64 | Temp 98.5°F | Resp 14 | Ht 65.0 in | Wt 127.0 lb

## 2015-02-22 DIAGNOSIS — T148 Other injury of unspecified body region: Secondary | ICD-10-CM | POA: Diagnosis not present

## 2015-02-22 DIAGNOSIS — T148XXA Other injury of unspecified body region, initial encounter: Secondary | ICD-10-CM

## 2015-02-22 NOTE — Patient Instructions (Signed)

## 2015-02-22 NOTE — Progress Notes (Signed)
Pre visit review using our clinic review tool, if applicable. No additional management support is needed unless otherwise documented below in the visit note. 

## 2015-02-22 NOTE — Progress Notes (Signed)
   Subjective:    Patient ID: [name], female    DOB: 11-15-1928, 81 y.o.   MRN: [id number]  HPI  [name] is a 81 yo female with a CC of hematoma from blood draw.   1) Left antecubital space has hematoma that pt noticed last night. She had blood drawn at [facility] she reports and at the end the blood stopped running and the technician moved the needle. When she discovered this she called EMS and they evaluated her and told her to f/u with her PCP. Pt reports minimal discomfort and occasional burning sensation.   Review of Systems  Constitutional: Negative for fever, chills, diaphoresis and fatigue.  Respiratory: Negative for chest tightness, shortness of breath and wheezing.   Cardiovascular: Negative for chest pain, palpitations and leg swelling.  Gastrointestinal: Negative for nausea, vomiting and diarrhea.  Skin: Negative for rash.  Neurological: Negative for dizziness, weakness, numbness and headaches.  Hematological: Bruises/bleeds easily.  Psychiatric/Behavioral: The patient is not nervous/anxious.       Objective:   Physical Exam  Constitutional: She is oriented to person, place, and time. She appears well-developed and well-nourished. No distress.  BP 148/88 mmHg  Pulse 64  Temp(Src) 98.5 F (36.9 C)  Resp 14  Ht 5\' 5"  (1.651 m)  Wt 127 lb (57.607 kg)  BMI 21.13 kg/m2  SpO2 96%   HENT:  Head: Normocephalic and atraumatic.  Right Ear: External ear normal.  Left Ear: External ear normal.  Neurological: She is alert and oriented to person, place, and time. No cranial nerve deficit. She exhibits normal muscle tone. Coordination normal.  Skin: Skin is warm and dry. Ecchymosis noted. No rash noted. She is not diaphoretic.     Psychiatric: She has a normal mood and affect. Her behavior is normal. Judgment and thought content normal.      Assessment & Plan:  Hematoma  1) Looks like a straightforward hematoma with no significant complications  2) Gave pt handout  with information for home care. Reviewed with pt and answered  3) FU prn worsening/failure to improve.

## 2015-02-27 ENCOUNTER — Telehealth: Payer: Self-pay | Admitting: *Deleted

## 2015-02-27 NOTE — Telephone Encounter (Signed)
Patient called to let us know her bowel pattern had changed. I talked with the patient and she said she has been constipated since Sunday. She has to mash her hemorrhoids to get her BM to come out. Her Bm yesterday was only a small amount with some "runny stuff". She is having pain with what BM she does have and has to sit for 20-30 minutes before something comes out. Her BM 's are still black in color but darker than before. No bright red bleeding noted.  Advised to come get stool cards to complete. Increase her miralax to BID and appointment made for Monday with MD. She is aware that if there is anything else to do before Monday appointment I will call her back.

## 2015-02-27 NOTE — Telephone Encounter (Signed)
Patient stated that she cant get bowels to move. Have been using laxatives and suppositories. Stools are black. Stomach hurts when trying to go.

## 2015-02-28 ENCOUNTER — Ambulatory Visit (INDEPENDENT_AMBULATORY_CARE_PROVIDER_SITE_OTHER): Payer: Medicare Other | Admitting: General Surgery

## 2015-02-28 ENCOUNTER — Encounter: Payer: Self-pay | Admitting: General Surgery

## 2015-02-28 VITALS — BP 118/66 | HR 66 | Resp 14 | Ht 65.0 in | Wt 121.0 lb

## 2015-02-28 DIAGNOSIS — K5900 Constipation, unspecified: Secondary | ICD-10-CM

## 2015-02-28 LAB — POC HEMOCCULT BLD/STL (OFFICE/1-CARD/DIAGNOSTIC): FECAL OCCULT BLD: NEGATIVE

## 2015-02-28 NOTE — Patient Instructions (Signed)
Fleets enema for 3 days Follow up on Monday

## 2015-02-28 NOTE — Progress Notes (Signed)
Patient ID: [name], female   DOB: 10/12/1928, 81 y.o.   MRN: [id number]  Chief Complaint  Patient presents with  . Other    rectal pain    HPI [name] is a 81 y.o. female  has been constipated since Sunday. She has to mash her hemorrhoids to get her BM to come out. Her Bm yesterday was only a small amount with some "runny stuff". She is having pain with what BM she does have and has to sit for 20-30 minutes before something comes out. Her BM 's are still black in color but darker than before. No bright red bleeding noted. HPI  Past Medical History  Diagnosis Date  . Arthritis   . GERD (gastroesophageal reflux disease)   . Hypertension   . Chronic kidney disease   . Colon polyp   . UTI (lower urinary tract infection)   . Vasculitis     ANCA positive, pulmonary hemorrhage  . Thyroid disease 1957    hyperthyroid, had radioactiveiodine treatment   . Renal cell carcinoma 2003    s/p sgy for removal right kidney  . Hemorrhoids     Past Surgical History  Procedure Laterality Date  . Abdominal hysterectomy    . Bilateral salpingoophorectomy    . Hernia repair    . Fracture surgery Right     femur fracture  . Cholecystectomy  1957  . Nose surgery  2000  . Incontinence surgery  2006  . Cardiac catheterization  2008  . Colonoscopy w/ biopsies  May 27, 2011,01/10/14    tubular adenoma in the ascending colon and descending colon. Tubulovillous adenoma in the upper rectum 12 mm. Diverticulosis.  Marland Kitchen Upper gi endoscopy  May 30, 2004    large hiatal hernia, duodenal diverticulum.    Family History  Problem Relation Age of Onset  . Arthritis Mother   . Heart disease Mother   . Heart disease Father   . Heart disease Sister     CAD  . Hypertension Sister     Social History History  Substance Use Topics  . Smoking status: Never Smoker   . Smokeless tobacco: Never Used  . Alcohol Use: No    Allergies  Allergen Reactions  . Amoxicillin Other (See  Comments)    Hand tingling   . Nsaids   . Tolmetin Other (See Comments)    Cannot take because of Kidneys    Current Outpatient Prescriptions  Medication Sig Dispense Refill  . amLODipine (NORVASC) 5 MG tablet Take 5 mg by mouth daily.     Marland Kitchen aspirin 81 MG tablet Take 81 mg by mouth daily.    . cholecalciferol (VITAMIN D) 1000 UNITS tablet Take 1,000 Units by mouth daily.    Marland Kitchen CRANBERRY PO Take 84 mg by mouth daily.    . ferrous sulfate 325 (65 FE) MG tablet Take 1 mg by mouth daily.     . hydrocortisone-pramoxine (ANALPRAM HC) 2.5-1 % rectal cream Place 1 application rectally 3 (three) times daily. 30 g 2  . lisinopril (PRINIVIL,ZESTRIL) 10 MG tablet Take 10 mg by mouth daily.     . metoprolol tartrate (LOPRESSOR) 25 MG tablet Take 12.5 mg by mouth 2 (two) times daily.    . Multiple Vitamins-Minerals (CENTRUM SILVER PO) Take by mouth.    . Multiple Vitamins-Minerals (IMMUNE SYSTEM BOOSTER PO) Take by mouth. Ambatrose (Immune System) 3 tabs daily    . Omega-3 Fatty Acids (FISH OIL) 600 MG CAPS Take 1  capsule by mouth daily.    Marland Kitchen OVER THE COUNTER MEDICATION Take 3 tablets by mouth daily. Real coral calcium    . polyethylene glycol (MIRALAX / GLYCOLAX) packet Take 17 g by mouth daily.    . predniSONE (DELTASONE) 5 MG tablet Take 5 mg by mouth daily.    . Probiotic Product (PROBIOTIC DAILY PO) Take 1 tablet by mouth daily.    . Turmeric 500 MG CAPS Take 1 capsule by mouth daily.     . vitamin C (ASCORBIC ACID) 500 MG tablet Take 500 mg by mouth daily.     No current facility-administered medications for this visit.    Review of Systems Review of Systems  Constitutional: Negative.   Respiratory: Negative.   Cardiovascular: Negative.     Blood pressure 118/66, pulse 66, resp. rate 14, height 5\' 5"  (1.651 m), weight 121 lb (54.885 kg).  Physical Exam Physical Exam  Constitutional: She is oriented to person, place, and time. She appears well-developed and well-nourished.  Eyes:  Conjunctivae are normal. No scleral icterus.  Neck: Neck supple.  Cardiovascular: Normal rate, regular rhythm and normal heart sounds.   Pulmonary/Chest: Effort normal and breath sounds normal.  Abdominal: Soft. Bowel sounds are normal. There is no tenderness.  Genitourinary: Rectal exam shows no external hemorrhoid, no internal hemorrhoid, no fissure and anal tone normal. Guaiac negative stool.     Multiple hard stool balls extracted. Dark in color secondary to iron therapy.  Lymphadenopathy:    She has no cervical adenopathy.  Neurological: She is alert and oriented to person, place, and time.  Skin: Skin is warm and dry.      Assessment    Obstipation.    Plan    The patient will make use of a fleets enema daily for the next 3 days. She'll continue on MiraLAX daily. We'll plan on a repeat exam in 5 days.     PCP:  Carolin Coy 03/01/2015, 7:58 PM

## 2015-03-01 DIAGNOSIS — K5900 Constipation, unspecified: Secondary | ICD-10-CM | POA: Insufficient documentation

## 2015-03-05 ENCOUNTER — Ambulatory Visit (INDEPENDENT_AMBULATORY_CARE_PROVIDER_SITE_OTHER): Payer: Medicare Other | Admitting: General Surgery

## 2015-03-05 ENCOUNTER — Encounter: Payer: Self-pay | Admitting: General Surgery

## 2015-03-05 ENCOUNTER — Ambulatory Visit: Payer: Self-pay | Admitting: General Surgery

## 2015-03-05 VITALS — BP 124/76 | HR 78 | Resp 14 | Ht 65.0 in | Wt 126.0 lb

## 2015-03-05 DIAGNOSIS — K5909 Other constipation: Secondary | ICD-10-CM | POA: Diagnosis not present

## 2015-03-05 LAB — POC HEMOCCULT BLD/STL (HOME/3-CARD/SCREEN)
Card #3 Fecal Occult Blood, POC: NEGATIVE
FECAL OCCULT BLD: NEGATIVE
Fecal Occult Blood, POC: NEGATIVE

## 2015-03-05 NOTE — Patient Instructions (Addendum)
Patient to use Miralex or enema once a week if needed.

## 2015-03-05 NOTE — Progress Notes (Signed)
Patient ID: [name], female   DOB: 17-Sep-1928, 81 y.o.   MRN: [id number]  Chief Complaint  Patient presents with  . Follow-up    HPI [name] is a 81 y.o. female here today follow up with constipation. Patient states she completed 3 fleets enemas  over the last three days. This resulted in a marked relief of her rectal discomfort. She vividly described a bowel movement of foot in length and a good 4 cm in diameter. She states she has been moving her bowels normal again, normal color. Only taking on iron pill daily.  Stool Hemoccults slides return today were negative. HPI  Past Medical History  Diagnosis Date  . Arthritis   . GERD (gastroesophageal reflux disease)   . Hypertension   . Chronic kidney disease   . Colon polyp   . UTI (lower urinary tract infection)   . Vasculitis     ANCA positive, pulmonary hemorrhage  . Thyroid disease 1957    hyperthyroid, had radioactiveiodine treatment   . Renal cell carcinoma 2003    s/p sgy for removal right kidney  . Hemorrhoids     Past Surgical History  Procedure Laterality Date  . Abdominal hysterectomy    . Bilateral salpingoophorectomy    . Hernia repair    . Fracture surgery Right     femur fracture  . Cholecystectomy  1957  . Nose surgery  2000  . Incontinence surgery  2006  . Cardiac catheterization  2008  . Colonoscopy w/ biopsies  May 27, 2011,01/10/14    tubular adenoma in the ascending colon and descending colon. Tubulovillous adenoma in the upper rectum 12 mm. Diverticulosis.  Marland Kitchen Upper gi endoscopy  May 30, 2004    large hiatal hernia, duodenal diverticulum.    Family History  Problem Relation Age of Onset  . Arthritis Mother   . Heart disease Mother   . Heart disease Father   . Heart disease Sister     CAD  . Hypertension Sister     Social History History  Substance Use Topics  . Smoking status: Never Smoker   . Smokeless tobacco: Never Used  . Alcohol Use: No    Allergies  Allergen  Reactions  . Amoxicillin Other (See Comments)    Hand tingling   . Nsaids   . Tolmetin Other (See Comments)    Cannot take because of Kidneys    Current Outpatient Prescriptions  Medication Sig Dispense Refill  . amLODipine (NORVASC) 5 MG tablet Take 5 mg by mouth daily.     Marland Kitchen aspirin 81 MG tablet Take 81 mg by mouth daily.    . cholecalciferol (VITAMIN D) 1000 UNITS tablet Take 1,000 Units by mouth daily.    Marland Kitchen CRANBERRY PO Take 84 mg by mouth daily.    . ferrous sulfate 325 (65 FE) MG tablet Take 1 mg by mouth daily.     . hydrocortisone-pramoxine (ANALPRAM HC) 2.5-1 % rectal cream Place 1 application rectally 3 (three) times daily. 30 g 2  . Iron Combinations (IRON COMPLEX) CAPS Take by mouth daily.    Marland Kitchen lisinopril (PRINIVIL,ZESTRIL) 10 MG tablet Take 10 mg by mouth daily.     . metoprolol tartrate (LOPRESSOR) 25 MG tablet Take 12.5 mg by mouth 2 (two) times daily.    . Multiple Vitamins-Minerals (CENTRUM SILVER PO) Take by mouth.    . Multiple Vitamins-Minerals (IMMUNE SYSTEM BOOSTER PO) Take by mouth. Ambatrose (Immune System) 3 tabs daily    .  Omega-3 Fatty Acids (FISH OIL) 600 MG CAPS Take 1 capsule by mouth daily.    Marland Kitchen OVER THE COUNTER MEDICATION Take 3 tablets by mouth daily. Real coral calcium    . polyethylene glycol (MIRALAX / GLYCOLAX) packet Take 17 g by mouth daily.    . predniSONE (DELTASONE) 5 MG tablet Take 5 mg by mouth daily.    . Probiotic Product (PROBIOTIC DAILY PO) Take 1 tablet by mouth daily.    . Turmeric 500 MG CAPS Take 1 capsule by mouth daily.     . vitamin C (ASCORBIC ACID) 500 MG tablet Take 500 mg by mouth daily.     No current facility-administered medications for this visit.    Review of Systems Review of Systems  Constitutional: Negative.   Respiratory: Negative.   Cardiovascular: Negative.     Blood pressure 124/76, pulse 78, resp. rate 14, height 5\' 5"  (1.651 m), weight 126 lb (57.153 kg).  Physical Exam Physical Exam  Genitourinary:           Assessment    Resolution of pelvic discomfort.    Plan       Patient to use Miralex daily and if needed make use of a fleets  enema once a week.  Patient to return as needed.  PCP:  Carolin Coy 03/06/2015, 7:11 AM

## 2015-03-06 ENCOUNTER — Telehealth: Payer: Self-pay | Admitting: *Deleted

## 2015-03-06 NOTE — Telephone Encounter (Signed)
Notified patient as instructed, patient pleased. patient agrees  

## 2015-03-06 NOTE — Telephone Encounter (Signed)
-----   Message from Robert Bellow, MD sent at 03/06/2015  7:15 AM EDT ----- Please contact the patient and make sure she understands that I want her to make use of MiraLAX, 1 capsule daily and to reserve the fleets enema to a weekly usage if needed. Thank you

## 2015-06-16 ENCOUNTER — Encounter: Payer: Self-pay | Admitting: Emergency Medicine

## 2015-06-16 ENCOUNTER — Emergency Department
Admission: EM | Admit: 2015-06-16 | Discharge: 2015-06-16 | Disposition: A | Discharge status: Home / Self Care | Payer: Medicare Other | Attending: Emergency Medicine | Admitting: Emergency Medicine

## 2015-06-16 DIAGNOSIS — N189 Chronic kidney disease, unspecified: Secondary | ICD-10-CM | POA: Diagnosis not present

## 2015-06-16 DIAGNOSIS — Z79899 Other long term (current) drug therapy: Secondary | ICD-10-CM | POA: Insufficient documentation

## 2015-06-16 DIAGNOSIS — Z880 Allergy status to penicillin: Secondary | ICD-10-CM | POA: Insufficient documentation

## 2015-06-16 DIAGNOSIS — I129 Hypertensive chronic kidney disease with stage 1 through stage 4 chronic kidney disease, or unspecified chronic kidney disease: Secondary | ICD-10-CM | POA: Diagnosis not present

## 2015-06-16 DIAGNOSIS — W2209XA Striking against other stationary object, initial encounter: Secondary | ICD-10-CM | POA: Diagnosis not present

## 2015-06-16 DIAGNOSIS — Y9389 Activity, other specified: Secondary | ICD-10-CM | POA: Insufficient documentation

## 2015-06-16 DIAGNOSIS — Y998 Other external cause status: Secondary | ICD-10-CM | POA: Diagnosis not present

## 2015-06-16 DIAGNOSIS — Z7952 Long term (current) use of systemic steroids: Secondary | ICD-10-CM | POA: Diagnosis not present

## 2015-06-16 DIAGNOSIS — S81811A Laceration without foreign body, right lower leg, initial encounter: Secondary | ICD-10-CM | POA: Diagnosis present

## 2015-06-16 DIAGNOSIS — E039 Hypothyroidism, unspecified: Secondary | ICD-10-CM | POA: Diagnosis not present

## 2015-06-16 DIAGNOSIS — Y9289 Other specified places as the place of occurrence of the external cause: Secondary | ICD-10-CM | POA: Insufficient documentation

## 2015-06-16 DIAGNOSIS — Z7982 Long term (current) use of aspirin: Secondary | ICD-10-CM | POA: Insufficient documentation

## 2015-06-16 NOTE — ED Notes (Signed)
States bumped R lower leg on box last night, states skin tear Lower R leg, noted with bandage over, serous fluid noted, bandage not removed in triage.

## 2015-06-16 NOTE — ED Notes (Signed)
Pt presents with skin tear to lower right leg. Wound seeping clear pink tinged fluid. Pt states hit leg on box top this morning while moving.

## 2015-06-16 NOTE — Discharge Instructions (Signed)
Stitches, Staples, or Adhesive Wound Closure Doctors use stitches (sutures), staples, and certain glue (skin adhesives) to hold your skin together while it heals (wound closure). You may need this treatment after you have surgery or if you cut your skin accidentally. These methods help your skin heal more quickly. They also make it less likely that you will have a scar. WHAT ARE THE DIFFERENT KINDS OF WOUND CLOSURES? There are many options for wound closure. The one that your doctor uses depends on how deep and large your wound is. Adhesive Glue To use this glue to close a wound, your doctor holds the edges of the wound together and paints the glue on the surface of your skin. You may need more than one layer of glue. Then the wound may be covered with a light bandage (dressing). This type of skin closure may be used for small wounds that are not deep (superficial). Using glue for wound closure is less painful than other methods. It does not require a medicine that numbs the area. This method also leaves nothing to be removed. Adhesive glue is often used for children and on facial wounds. Adhesive glue cannot be used for wounds that are deep, uneven, or bleeding. It is not used inside of a wound.  Adhesive Strips These strips are made of sticky (adhesive), porous paper. They are placed across your skin edges like a regular adhesive bandage. You leave them on until they fall off. Adhesive strips may be used to close very superficial wounds. They may also be used along with sutures to improve closure of your skin edges.  Sutures Sutures are the oldest method of wound closure. Sutures can be made from natural or synthetic materials. They can be made from a material that your body can break down as your wound heals (absorbable), or they can be made from a material that needs to be removed from your skin (nonabsorbable). They come in many different strengths and sizes. Your doctor attaches the sutures to a  steel needle on one end. Sutures can be passed through your skin, or through the tissues beneath your skin. Then they are tied and cut. Your skin edges may be closed in one continuous stitch or in separate stitches. Sutures are strong and can be used for all kinds of wounds. Absorbable sutures may be used to close tissues under the skin. The disadvantage of sutures is that they may cause skin reactions that lead to infection. Nonabsorbable sutures need to be removed. Staples When surgical staples are used to close a wound, the edges of your skin on both sides of the wound are brought close together. A staple is placed across the wound, and an instrument secures the edges together. Staples are often used to close surgical cuts (incisions). Staples are faster to use than sutures, and they cause less reaction from your skin. Staples need to be removed using a tool that bends the staples away from your skin. HOW DO I CARE FOR MY WOUND CLOSURE?  Take medicines only as told by your doctor.  If you were prescribed an antibiotic medicine for your wound, finish it all even if you start to feel better.  Use ointments or creams only as told by your doctor.  Wash your hands with soap and water before and after touching your wound.  Do not soak your wound in water. Do not take baths, swim, or use a hot tub until your doctor says it is okay.  Ask your doctor when  water. Do not take baths, swim, or use a hot tub until your doctor says it is okay.  · Ask your doctor when you can start showering. Cover your wound if told by your doctor.  · Do not take out your own sutures or staples.  · Do not pick at your wound. Picking can cause an infection.  · Keep all follow-up visits as told by your doctor. This is important.  HOW LONG WILL I HAVE MY WOUND CLOSURE?   · Leave adhesive glue on your skin until the glue peels away.  · Leave adhesive strips on your skin until they fall off.  · Absorbable sutures will dissolve within several days.  · Nonabsorbable sutures and staples must be removed. The location of the wound will  determine how long they stay in. This can range from several days to a couple of weeks.  WHEN SHOULD I SEEK HELP FOR MY WOUND CLOSURE?  Contact your doctor if:  · You have a fever.  · You have chills.  · You have redness, puffiness (swelling), or pain at the site of your wound.  · You have fluid, blood, or pus coming from your wound.  · There is a bad smell coming from your wound.  · The skin edges of your wound start to separate after your sutures have been removed.  · Your wound becomes thick, raised, and darker in color after your sutures come out (scarring).     This information is not intended to replace advice given to you by your health care provider. Make sure you discuss any questions you have with your health care provider.     Document Released: 05/18/2009 Document Revised: 08/11/2014 Document Reviewed: 12/28/2013  Elsevier Interactive Patient Education ©2016 [employer]

## 2015-06-16 NOTE — ED Provider Notes (Signed)
[facility] Emergency Department Provider Note  ____________________________________________  Time seen: Approximately 1:34 PM  I have reviewed the triage vital signs and the nursing notes.   HISTORY  Chief Complaint Laceration    HPI [name] is a 81 y.o. female  patient complaining of skin turned to the right lower leg which occurred last night. States she bumped her leg into her skin. Patient states she is concerned because it is oozing a serious fluid. Patient rates her pain discomfort as a 3/10. Patient is taking aspirin every night.   Past Medical History  Diagnosis Date  . Arthritis   . GERD (gastroesophageal reflux disease)   . Hypertension   . Chronic kidney disease   . Colon polyp   . UTI (lower urinary tract infection)   . Vasculitis ([place])     ANCA positive, pulmonary hemorrhage  . Thyroid disease 1957    hyperthyroid, had radioactiveiodine treatment   . Renal cell carcinoma ([place]) 2003    s/p sgy for removal right kidney  . Hemorrhoids     Patient Active Problem List   Diagnosis Date Noted  . CN (constipation) 03/01/2015  . Bleeding hemorrhoids 01/12/2015  . Wound, open, arm, forearm 12/29/2014  . Open wound, lower leg 12/22/2014  . Viral URI 12/22/2014  . History of kidney cancer 09/17/2014  . Palpitations 09/17/2014  . Rectal bleeding 01/03/2014  . Personal history of colonic polyps 01/03/2014  . Medicare annual wellness visit, subsequent 09/01/2013  . Absent pedal pulses 09/01/2013  . Screening for breast cancer 09/01/2013  . Encounter to establish care 08/09/2013  . Serum potassium elevated 08/09/2013  . Chronic kidney disease 08/09/2013    Past Surgical History  Procedure Laterality Date  . Abdominal hysterectomy    . Bilateral salpingoophorectomy    . Hernia repair    . Fracture surgery Right     femur fracture  . Cholecystectomy  1957  . Nose surgery  2000  . Incontinence surgery  2006  . Cardiac  catheterization  2008  . Colonoscopy w/ biopsies  May 27, 2011,01/10/14    tubular adenoma in the ascending colon and descending colon. Tubulovillous adenoma in the upper rectum 12 mm. Diverticulosis.  Marland Kitchen Upper gi endoscopy  May 30, 2004    large hiatal hernia, duodenal diverticulum.    Current Outpatient Rx  Name  Route  Sig  Dispense  Refill  . amLODipine (NORVASC) 5 MG tablet   Oral   Take 5 mg by mouth daily.          Marland Kitchen aspirin 81 MG tablet   Oral   Take 81 mg by mouth daily.         . cholecalciferol (VITAMIN D) 1000 UNITS tablet   Oral   Take 1,000 Units by mouth daily.         Marland Kitchen CRANBERRY PO   Oral   Take 84 mg by mouth daily.         . ferrous sulfate 325 (65 FE) MG tablet   Oral   Take 1 mg by mouth daily.          . hydrocortisone-pramoxine (ANALPRAM HC) 2.5-1 % rectal cream   Rectal   Place 1 application rectally 3 (three) times daily.   30 g   2   . Iron Combinations (IRON COMPLEX) CAPS   Oral   Take by mouth daily.         Marland Kitchen lisinopril (PRINIVIL,ZESTRIL) 10 MG tablet  Oral   Take 10 mg by mouth daily.          . metoprolol tartrate (LOPRESSOR) 25 MG tablet   Oral   Take 12.5 mg by mouth 2 (two) times daily.         . Multiple Vitamins-Minerals (CENTRUM SILVER PO)   Oral   Take by mouth.         . Multiple Vitamins-Minerals (IMMUNE SYSTEM BOOSTER PO)   Oral   Take by mouth. Ambatrose (Immune System) 3 tabs daily         . Omega-3 Fatty Acids (FISH OIL) 600 MG CAPS   Oral   Take 1 capsule by mouth daily.         Marland Kitchen OVER THE COUNTER MEDICATION   Oral   Take 3 tablets by mouth daily. Real coral calcium         . polyethylene glycol (MIRALAX / GLYCOLAX) packet   Oral   Take 17 g by mouth daily.         . predniSONE (DELTASONE) 5 MG tablet   Oral   Take 5 mg by mouth daily.         . Probiotic Product (PROBIOTIC DAILY PO)   Oral   Take 1 tablet by mouth daily.         . Turmeric 500 MG CAPS   Oral    Take 1 capsule by mouth daily.          . vitamin C (ASCORBIC ACID) 500 MG tablet   Oral   Take 500 mg by mouth daily.           Allergies Amoxicillin; Nsaids; and Tolmetin  Family History  Problem Relation Age of Onset  . Arthritis Mother   . Heart disease Mother   . Heart disease Father   . Heart disease Sister     CAD  . Hypertension Sister     Social History Social History  Substance Use Topics  . Smoking status: Never Smoker   . Smokeless tobacco: Never Used  . Alcohol Use: No    Review of Systems Constitutional: No fever/chills Eyes: No visual changes. ENT: No sore throat. Cardiovascular: Denies chest pain. Respiratory: Denies shortness of breath. Gastrointestinal: No abdominal pain.  No nausea, no vomiting.  No diarrhea.  No constipation. Genitourinary: Negative for dysuria. Musculoskeletal: Negative for back pain. Skin: Negative for rash. Neurological: Negative for headaches, focal weakness or numbness. Endocrine:Hypertension and hypothyroidism 10-point ROS otherwise negative.  ____________________________________________   PHYSICAL EXAM:  VITAL SIGNS: ED Triage Vitals  Enc Vitals Group     BP 06/16/15 1302 137/77 mmHg     Pulse Rate 06/16/15 1302 87     Resp 06/16/15 1302 20     Temp 06/16/15 1302 97.5 F (36.4 C)     Temp Source 06/16/15 1302 Oral     SpO2 06/16/15 1302 98 %     Weight 06/16/15 1302 127 lb (57.607 kg)     Height 06/16/15 1302 5\' 2"  (1.575 m)     Head Cir --      Peak Flow --      Pain Score 06/16/15 1304 3     Pain Loc --      Pain Edu? --      Excl. in [place]? --     Constitutional: Alert and oriented. Well appearing and in no acute distress. Eyes: Conjunctivae are normal. PERRL. EOMI. Head: Atraumatic. Nose: No congestion/rhinnorhea. Mouth/Throat: Mucous membranes are  moist.  Oropharynx non-erythematous. Neck: No stridor.  No cervical spine tenderness to palpation. Hematological/Lymphatic/Immunilogical: No  cervical lymphadenopathy. Cardiovascular: Normal rate, regular rhythm. Grossly normal heart sounds.  Good peripheral circulation. Respiratory: Normal respiratory effort.  No retractions. Lungs CTAB. Gastrointestinal: Soft and nontender. No distention. No abdominal bruits. No CVA tenderness. Musculoskeletal: No lower extremity tenderness nor edema.  No joint effusions. Neurologic:  Normal speech and language. No gross focal neurologic deficits are appreciated. No gait instability. Skin:  Skin is warm, dry and intact. No rash noted. Psychiatric: Mood and affect are normal. Speech and behavior are normal.  ____________________________________________   LABS (all labs ordered are listed, but only abnormal results are displayed)  Labs Reviewed - No data to display ____________________________________________  EKG   ____________________________________________  RADIOLOGY   ____________________________________________   PROCEDURES  Procedure(s) performed: None  Critical Care performed: No  ____________________________________________   INITIAL IMPRESSION / ASSESSMENT AND PLAN / ED COURSE  Pertinent labs & imaging results that were available during my care of the patient were reviewed by me and considered in my medical decision making (see chart for details).  Skin tear right lower leg. There was cleaned and closed with Dermabond. Present just was applied and patient given home care instructions. Patient advised return by ER if the wound reopens. ____________________________________________   FINAL CLINICAL IMPRESSION(S) / ED DIAGNOSES  Final diagnoses:  Skin tear of right lower leg without complication, initial encounter      Sable Feil, PA-C 06/16/15 1347  Delman Kitten, MD 06/16/15 [phone]

## 2015-06-18 ENCOUNTER — Ambulatory Visit (INDEPENDENT_AMBULATORY_CARE_PROVIDER_SITE_OTHER): Payer: Medicare Other | Admitting: Nurse Practitioner

## 2015-06-18 VITALS — BP 128/82 | HR 69 | Temp 98.3°F | Resp 12 | Ht 65.0 in | Wt 121.4 lb

## 2015-06-18 DIAGNOSIS — S81801S Unspecified open wound, right lower leg, sequela: Secondary | ICD-10-CM

## 2015-06-18 NOTE — Progress Notes (Signed)
Pre visit review using our clinic review tool, if applicable. No additional management support is needed unless otherwise documented below in the visit note. 

## 2015-06-18 NOTE — Progress Notes (Signed)
Patient ID: [name], female    DOB: 12/10/1928  Age: 81 y.o. MRN: [id number]  CC: ER Follow Up   HPI [name] presents for ER follow up from cut on right leg.   1) 06/16/15 ED visit  Tripped over a box and it cut her leg  Dermabond to heal wound Pt reports no concerns at this time and if worsens in the future she will let me know so I can get her back in with [place].   History [name] has a past medical history of Arthritis; GERD (gastroesophageal reflux disease); Hypertension; Chronic kidney disease; Colon polyp; UTI (lower urinary tract infection); Vasculitis ([place]); Thyroid disease (1957); Renal cell carcinoma ([place]) (2003); and Hemorrhoids.   She has past surgical history that includes Abdominal hysterectomy; Bilateral salpingoophorectomy; Hernia repair; Fracture surgery (Right); Cholecystectomy (1957); Nose surgery (2000); Incontinence surgery (2006); Cardiac catheterization (2008); Colonoscopy w/ biopsies (May 27, 2011,01/10/14); and Upper gi endoscopy (May 30, 2004).   Her family history includes Arthritis in her mother; Heart disease in her father, mother, and sister; Hypertension in her sister.She reports that she has never smoked. She has never used smokeless tobacco. She reports that she does not drink alcohol or use illicit drugs.  Outpatient Prescriptions Prior to Visit  Medication Sig Dispense Refill  . aspirin 81 MG tablet Take 81 mg by mouth daily.    . cholecalciferol (VITAMIN D) 1000 UNITS tablet Take 1,000 Units by mouth daily.    Marland Kitchen CRANBERRY PO Take 84 mg by mouth daily.    . ferrous sulfate 325 (65 FE) MG tablet Take 1 mg by mouth daily.     . hydrocortisone-pramoxine (ANALPRAM HC) 2.5-1 % rectal cream Place 1 application rectally 3 (three) times daily. 30 g 2  . Iron Combinations (IRON COMPLEX) CAPS Take by mouth daily.    Marland Kitchen lisinopril (PRINIVIL,ZESTRIL) 10 MG tablet Take 10 mg by mouth daily.     . metoprolol tartrate (LOPRESSOR) 25 MG  tablet Take 12.5 mg by mouth 2 (two) times daily.    . Multiple Vitamins-Minerals (CENTRUM SILVER PO) Take by mouth.    . Multiple Vitamins-Minerals (IMMUNE SYSTEM BOOSTER PO) Take by mouth. Ambatrose (Immune System) 3 tabs daily    . Omega-3 Fatty Acids (FISH OIL) 600 MG CAPS Take 1 capsule by mouth daily.    Marland Kitchen OVER THE COUNTER MEDICATION Take 3 tablets by mouth daily. Real coral calcium    . polyethylene glycol (MIRALAX / GLYCOLAX) packet Take 17 g by mouth daily.    . predniSONE (DELTASONE) 5 MG tablet Take 5 mg by mouth daily.    . Probiotic Product (PROBIOTIC DAILY PO) Take 1 tablet by mouth daily.    . Turmeric 500 MG CAPS Take 1 capsule by mouth daily.     . vitamin C (ASCORBIC ACID) 500 MG tablet Take 500 mg by mouth daily.    Marland Kitchen amLODipine (NORVASC) 5 MG tablet Take 5 mg by mouth daily.      No facility-administered medications prior to visit.    ROS Review of Systems  Constitutional: Negative for fever, chills, diaphoresis and fatigue.  Skin: Positive for color change and wound.       Right LE    Objective:  BP 128/82 mmHg  Pulse 69  Temp(Src) 98.3 F (36.8 C)  Resp 12  Ht 5\' 5"  (1.651 m)  Wt 121 lb 6.4 oz (55.067 kg)  BMI 20.20 kg/m2  SpO2 97%  Physical Exam  Constitutional:  She is oriented to person, place, and time. She appears well-developed and well-nourished. No distress.  HENT:  Head: Normocephalic and atraumatic.  Right Ear: External ear normal.  Left Ear: External ear normal.  Neurological: She is alert and oriented to person, place, and time. No cranial nerve deficit. She exhibits normal muscle tone. Coordination normal.  Skin: Skin is warm and dry. No rash noted. She is not diaphoretic.  Well healing wound on right leg, no drainage or new opening visualized on inspection. Dermabond still in place  Psychiatric: She has a normal mood and affect. Her behavior is normal. Judgment and thought content normal.    Assessment & Plan:   [name] was seen today  for er follow up.  Diagnoses and all orders for this visit:  Open wound, lower leg, right, sequela  I am having [name] maintain her predniSONE, ferrous sulfate, cholecalciferol, aspirin, Multiple Vitamins-Minerals (CENTRUM SILVER PO), CRANBERRY PO, Probiotic Product (PROBIOTIC DAILY PO), vitamin C, Fish Oil, Multiple Vitamins-Minerals (IMMUNE SYSTEM BOOSTER PO), Turmeric, OVER THE COUNTER MEDICATION, metoprolol tartrate, hydrocortisone-pramoxine, amLODipine, lisinopril, polyethylene glycol, and IRON COMPLEX.  No orders of the defined types were placed in this encounter.     Follow-up: Return if symptoms worsen or fail to improve.

## 2015-06-26 ENCOUNTER — Encounter: Payer: Self-pay | Admitting: Nurse Practitioner

## 2015-06-26 NOTE — Assessment & Plan Note (Signed)
Right leg, Closed wound today. Well healing. No further treatment needed at this time. Asked pt to let me know if she feels she needs to be seen by the [facility] once again. She was pleased with how they helped her previously.

## 2015-07-11 ENCOUNTER — Encounter: Payer: Medicare Other | Attending: Surgery | Admitting: Surgery

## 2015-07-11 DIAGNOSIS — Z880 Allergy status to penicillin: Secondary | ICD-10-CM | POA: Diagnosis not present

## 2015-07-11 DIAGNOSIS — E079 Disorder of thyroid, unspecified: Secondary | ICD-10-CM | POA: Diagnosis not present

## 2015-07-11 DIAGNOSIS — W19XXXA Unspecified fall, initial encounter: Secondary | ICD-10-CM | POA: Diagnosis not present

## 2015-07-11 DIAGNOSIS — R600 Localized edema: Secondary | ICD-10-CM | POA: Diagnosis not present

## 2015-07-11 DIAGNOSIS — K219 Gastro-esophageal reflux disease without esophagitis: Secondary | ICD-10-CM | POA: Diagnosis not present

## 2015-07-11 DIAGNOSIS — S60511A Abrasion of right hand, initial encounter: Secondary | ICD-10-CM | POA: Diagnosis not present

## 2015-07-11 DIAGNOSIS — M1990 Unspecified osteoarthritis, unspecified site: Secondary | ICD-10-CM | POA: Diagnosis not present

## 2015-07-11 DIAGNOSIS — Z85528 Personal history of other malignant neoplasm of kidney: Secondary | ICD-10-CM | POA: Diagnosis not present

## 2015-07-11 DIAGNOSIS — I129 Hypertensive chronic kidney disease with stage 1 through stage 4 chronic kidney disease, or unspecified chronic kidney disease: Secondary | ICD-10-CM | POA: Diagnosis not present

## 2015-07-11 DIAGNOSIS — N189 Chronic kidney disease, unspecified: Secondary | ICD-10-CM | POA: Insufficient documentation

## 2015-07-11 DIAGNOSIS — L97212 Non-pressure chronic ulcer of right calf with fat layer exposed: Secondary | ICD-10-CM | POA: Diagnosis not present

## 2015-07-14 NOTE — Progress Notes (Signed)
ALVERIA, WOLANSKI ([id number]) Visit Report for 07/11/2015 Allergy List Details Patient Name: FREDERICKA, GIESECKE Date of Service: 07/11/2015 10:00 AM Medical Record Number: [id number] Patient Account Number: [id number] Date of Birth/Sex: 20-Aug-1928 (81 y.o. Female) Treating RN: Cornell Barman Primary Care Physician: Lorane Gell Other Clinician: Referring Physician: SELF, REFERRED Treating Physician/Extender: BURNS III, WALTER Weeks in Treatment: 0 Allergies Active Allergies amoxicillin Reaction: itching NSAIDS (Non-Steroidal Anti-Inflammatory Drug) tolmetin Allergy Notes Electronic Signature(s) Signed: 07/13/2015 3:49:12 PM By: Gretta Cool, RN, BSN, Kim RN, BSN Entered By: Gretta Cool, RN, BSN, Kim on 07/11/2015 10:50:58 Khouri, [name] ([id number]) -------------------------------------------------------------------------------- Arrival Information Details Patient Name: [name] Date of Service: 07/11/2015 10:00 AM Medical Record Number: [id number] Patient Account Number: [id number] Date of Birth/Sex: 12-23-1928 (81 y.o. Female) Treating RN: Cornell Barman Primary Care Physician: Lorane Gell Other Clinician: Referring Physician: SELF, REFERRED Treating Physician/Extender: BURNS III, Charlean Sanfilippo in Treatment: 0 Visit Information Patient Arrived: Ambulatory Arrival Time: 10:35 Accompanied By: self Transfer Assistance: Manual Patient Identification Verified: Yes Secondary Verification Process Yes Completed: History Since Last Visit Electronic Signature(s) Signed: 07/13/2015 3:49:12 PM By: Gretta Cool, RN, BSN, Kim RN, BSN Entered By: Gretta Cool, RN, BSN, Kim on 07/11/2015 10:37:09 Rodenberg, [name] ([id number]) -------------------------------------------------------------------------------- Clinic Level of Care Assessment Details Patient Name: [name] Date of Service: 07/11/2015 10:00 AM Medical Record Number: [id number] Patient Account Number: [id number] Date of Birth/Sex: 09-05-1928 (81  y.o. Female) Treating RN: Cornell Barman Primary Care Physician: Lorane Gell Other Clinician: Referring Physician: SELF, REFERRED Treating Physician/Extender: BURNS III, WALTER Weeks in Treatment: 0 Clinic Level of Care Assessment Items TOOL 1 Quantity Score []  - Use when EandM and Procedure is performed on INITIAL visit 0 ASSESSMENTS - Nursing Assessment / Reassessment X - General Physical Exam (combine w/ comprehensive assessment (listed just 1 20 below) when performed on new pt. evals) X - Comprehensive Assessment (HX, ROS, Risk Assessments, Wounds Hx, etc.) 1 25 ASSESSMENTS - Wound and Skin Assessment / Reassessment []  - Dermatologic / Skin Assessment (not related to wound area) 0 ASSESSMENTS - Ostomy and/or Continence Assessment and Care []  - Incontinence Assessment and Management 0 []  - Ostomy Care Assessment and Management (repouching, etc.) 0 PROCESS - Coordination of Care X - Simple Patient / Family Education for ongoing care 1 15 []  - Complex (extensive) Patient / Family Education for ongoing care 0 X - Staff obtains Programmer, systems, Records, Test Results / Process Orders 1 10 []  - Staff telephones HHA, Nursing Homes / Clarify orders / etc 0 []  - Routine Transfer to another Facility (non-emergent condition) 0 []  - Routine Hospital Admission (non-emergent condition) 0 X - New Admissions / Biomedical engineer / Ordering NPWT, Apligraf, etc. 1 15 []  - Emergency Hospital Admission (emergent condition) 0 PROCESS - Special Needs []  - Pediatric / Minor Patient Management 0 []  - Isolation Patient Management 0 Laurel, Jillyan W. ([id number]) []  - Hearing / Language / Visual special needs 0 []  - Assessment of Community assistance (transportation, D/C planning, etc.) 0 []  - Additional assistance / Altered mentation 0 []  - Support Surface(s) Assessment (bed, cushion, seat, etc.) 0 INTERVENTIONS - Miscellaneous []  - External ear exam 0 []  - Patient Transfer (multiple staff / Civil Service fast streamer /  Similar devices) 0 []  - Simple Staple / Suture removal (25 or less) 0 []  - Complex Staple / Suture removal (26 or more) 0 []  - Hypo/Hyperglycemic Management (do not check if billed separately) 0 X - Ankle / Brachial Index (ABI) - do not check if billed separately 1 15  Has the patient been seen at the hospital within the last three years: Yes Total Score: 100 Level Of Care: New/Established - Level 3 Electronic Signature(s) Signed: 07/13/2015 3:49:12 PM By: Gretta Cool, RN, BSN, Kim RN, BSN Entered By: Gretta Cool, RN, BSN, Kim on 07/11/2015 11:18:12 Franklyn, [name] ([id number]) -------------------------------------------------------------------------------- Encounter Discharge Information Details Patient Name: [name] Date of Service: 07/11/2015 10:00 AM Medical Record Number: [id number] Patient Account Number: [id number] Date of Birth/Sex: [birth year] (81 y.o. Female) Treating RN: Cornell Barman Primary Care Physician: Lorane Gell Other Clinician: Referring Physician: SELF, REFERRED Treating Physician/Extender: BURNS III, WALTER Weeks in Treatment: 0 Encounter Discharge Information Items Discharge Pain Level: 0 Discharge Condition: Stable Ambulatory Status: Ambulatory Discharge Destination: Home Transportation: Private Auto Accompanied By: self Schedule Follow-up Appointment: Yes Medication Reconciliation completed and provided to Patient/Care Yes Turrell Severt: Provided on Clinical Summary of Care: 07/11/2015 Form Type Recipient Paper Patient NB Electronic Signature(s) Signed: 07/11/2015 11:24:36 AM By: Ruthine Dose Entered By: Ruthine Dose on 07/11/2015 11:24:36 Caisse, [name] ([id number]) -------------------------------------------------------------------------------- Lower Extremity Assessment Details Patient Name: [name] Date of Service: 07/11/2015 10:00 AM Medical Record Number: [id number] Patient Account Number: [id number] Date of Birth/Sex: 12/11/1928 (81 y.o.  Female) Treating RN: Cornell Barman Primary Care Physician: Lorane Gell Other Clinician: Referring Physician: SELF, REFERRED Treating Physician/Extender: BURNS III, WALTER Weeks in Treatment: 0 Edema Assessment Assessed: [Left: No] [Right: No] E[Left: dema] [Right: :] Calf Left: Right: Point of Measurement: 32 cm From Medial Instep cm 32.8 cm Ankle Left: Right: Point of Measurement: 12 cm From Medial Instep cm 21.6 cm Vascular Assessment Pulses: Posterior Tibial Palpable: [Right:Yes] Dorsalis Pedis Palpable: [Right:Yes] Extremity colors, hair growth, and conditions: Extremity Color: [Right:Hyperpigmented] Hair Growth on Extremity: [Right:Yes] Temperature of Extremity: [Right:Warm] Capillary Refill: [Right:< 3 seconds] Blood Pressure: Brachial: [Right:142] Dorsalis Pedis: [Left:Dorsalis Pedis: 106] Ankle: Posterior Tibial: [Left:Posterior Tibial:] [Right:0.75] Toe Nail Assessment Left: Right: Thick: No Discolored: No Deformed: No Improper Length and Hygiene: No Electronic Signature(s) CHITRA, IGOU ([id number]) Signed: 07/13/2015 3:49:12 PM By: Gretta Cool, RN, BSN, Kim RN, BSN Entered By: Gretta Cool, RN, BSN, Kim on 07/11/2015 10:59:40 Rowlands, [name] ([id number]) -------------------------------------------------------------------------------- Multi Wound Chart Details Patient Name: [name] Date of Service: 07/11/2015 10:00 AM Medical Record Number: [id number] Patient Account Number: [id number] Date of Birth/Sex: 06/20/1929 (81 y.o. Female) Treating RN: Cornell Barman Primary Care Physician: Lorane Gell Other Clinician: Referring Physician: SELF, REFERRED Treating Physician/Extender: BURNS III, WALTER Weeks in Treatment: 0 Vital Signs Height(in): 60 Pulse(bpm): 62 Weight(lbs): 128 Blood Pressure 142/69 (mmHg): Body Mass Index(BMI): 25 Temperature(F): 97.5 Respiratory Rate 18 (breaths/min): Photos: [3:No Photos] [4:No Photos] [5:No Photos] Wound Location:  [3:Right Lower Leg - Medial Right Lower Leg - Lateral Hand - Web Between 2nd] [5:and 3rd Digit - Circumfernential] Wounding Event: [3:Trauma] [4:Trauma] [5:Trauma] Primary Etiology: [3:Trauma, Other] [4:Trauma, Other] [5:Trauma, Other] Date Acquired: [3:07/04/2015] [4:06/19/2015] [5:07/11/2015] Weeks of Treatment: [3:0] [4:0] [5:0] Wound Status: [3:Open] [4:Open] [5:Open] Measurements L x W x D 2.4x1.5x0.2 [4:3.4x1.5x0.1] [5:0.1x1.2x0.1] (cm) Area (cm) : [3:2.827] [4:4.006] [5:0.094] Volume (cm) : [3:0.565] [4:0.401] [5:0.009] Classification: [3:Full Thickness Without Exposed Support Structures] [4:Partial Thickness] [5:Partial Thickness] Exudate Amount: [3:None Present] [4:N/A] [5:None Present] Wound Margin: [3:Flat and Intact] [4:Flat and Intact] [5:Flat and Intact] Granulation Amount: [3:None Present (0%)] [4:N/A] [5:None Present (0%)] Necrotic Amount: [3:Large (67-100%)] [4:N/A] [5:None Present (0%)] Necrotic Tissue: [3:Eschar] [4:Eschar] [5:N/A] Exposed Structures: [3:Fascia: No Fat: No Tendon: No Muscle: No Joint: No Bone: No Limited to Skin Breakdown] [4:Fascia: No Fat: No Tendon: No Muscle: No Joint:  No Bone: No Limited to Skin Breakdown] [5:Fascia: No Fat: No Tendon: No Muscle: No Joint: No Bone: No Limited to  Skin Breakdown] Periwound Skin Texture: Savard, Phuong W. ([id number]) Edema: Yes Edema: No Edema: No Excoriation: No Excoriation: No Excoriation: No Induration: No Induration: No Induration: No Callus: No Callus: No Callus: No Crepitus: No Crepitus: No Crepitus: No Fluctuance: No Fluctuance: No Fluctuance: No Friable: No Friable: No Friable: No Rash: No Rash: No Rash: No Scarring: No Scarring: No Scarring: No Periwound Skin Maceration: No Maceration: No Maceration: No Moisture: Moist: No Moist: No Moist: No Dry/Scaly: No Dry/Scaly: No Dry/Scaly: No Periwound Skin Color: Atrophie Blanche: No Atrophie Blanche: No Atrophie Blanche:  No Cyanosis: No Cyanosis: No Cyanosis: No Ecchymosis: No Ecchymosis: No Ecchymosis: No Erythema: No Erythema: No Erythema: No Hemosiderin Staining: No Hemosiderin Staining: No Hemosiderin Staining: No Mottled: No Mottled: No Mottled: No Pallor: No Pallor: No Pallor: No Rubor: No Rubor: No Rubor: No Tenderness on No No No Palpation: Wound Preparation: Ulcer Cleansing: Not Ulcer Cleansing: Ulcer Cleansing: Cleansed Rinsed/Irrigated with Rinsed/Irrigated with Saline Saline Topical Anesthetic Applied: Other: lidocaine Topical Anesthetic Topical Anesthetic 4% Applied: Other: lidocaine Applied: None 4% Treatment Notes Electronic Signature(s) Signed: 07/13/2015 3:49:12 PM By: Gretta Cool, RN, BSN, Kim RN, BSN Entered By: Gretta Cool, RN, BSN, Kim on 07/11/2015 10:56:49 Amadi, [name] ([id number]) -------------------------------------------------------------------------------- [facility] Details Patient Name: [name] Date of Service: 07/11/2015 10:00 AM Medical Record Number: [id number] Patient Account Number: [id number] Date of Birth/Sex: 1929-05-10 (81 y.o. Female) Treating RN: Cornell Barman Primary Care Physician: Lorane Gell Other Clinician: Referring Physician: SELF, REFERRED Treating Physician/Extender: BURNS III, WALTER Weeks in Treatment: 0 Active Inactive Orientation to the Wound Care Program Nursing Diagnoses: Knowledge deficit related to the wound healing center program Goals: Patient/caregiver will verbalize understanding of the [facility] Program Date Initiated: 07/11/2015 Goal Status: Active Interventions: Provide education on orientation to the wound center Notes: Soft Tissue Infection Nursing Diagnoses: Impaired tissue integrity Potential for infection: soft tissue Goals: Patient will remain free of wound infection Date Initiated: 07/11/2015 Goal Status: Active Interventions: Assess signs and symptoms of infection  every visit Notes: Wound/Skin Impairment Nursing Diagnoses: Impaired tissue integrity Goals: Ulcer/skin breakdown will heal within 14 weeks TUNJA, IVINS ([id number]) Date Initiated: 07/11/2015 Goal Status: Active Interventions: Assess patient/caregiver ability to obtain necessary supplies Assess ulceration(s) every visit Notes: Electronic Signature(s) Signed: 07/13/2015 3:49:12 PM By: Gretta Cool, RN, BSN, Kim RN, BSN Entered By: Gretta Cool, RN, BSN, Kim on 07/11/2015 10:56:39 Hagerman, [name] ([id number]) -------------------------------------------------------------------------------- Pain Assessment Details Patient Name: [name] Date of Service: 07/11/2015 10:00 AM Medical Record Number: [id number] Patient Account Number: [id number] Date of Birth/Sex: 09/07/1928 (81 y.o. Female) Treating RN: Cornell Barman Primary Care Physician: Lorane Gell Other Clinician: Referring Physician: SELF, REFERRED Treating Physician/Extender: BURNS III, WALTER Weeks in Treatment: 0 Active Problems Location of Pain Severity and Description of Pain Patient Has Paino No Site Locations Pain Management and Medication Current Pain Management: Electronic Signature(s) Signed: 07/13/2015 3:49:12 PM By: Gretta Cool, RN, BSN, Kim RN, BSN Entered By: Gretta Cool, RN, BSN, Kim on 07/11/2015 10:37:14 Baise, [name] ([id number]) -------------------------------------------------------------------------------- Patient/Caregiver Education Details Patient Name: [name] Date of Service: 07/11/2015 10:00 AM Medical Record Number: [id number] Patient Account Number: [id number] Date of Birth/Gender: 1928/10/11 (81 y.o. Female) Treating RN: Cornell Barman Primary Care Physician: Lorane Gell Other Clinician: Referring Physician: SELF, REFERRED Treating Physician/Extender: BURNS III, Charlean Sanfilippo in Treatment: 0 Education Assessment Education Provided To: Patient Education Topics Provided Wound/Skin  Impairment: Handouts: Caring for Your Ulcer Methods: Demonstration, Explain/Verbal Responses: State content correctly Electronic Signature(s) Signed: 07/13/2015 3:49:12 PM By: Gretta Cool, RN, BSN, Kim RN, BSN Entered By: Gretta Cool, RN, BSN, Kim on 07/11/2015 11:19:10 Horsey, [name] ([id number]) -------------------------------------------------------------------------------- Wound Assessment Details Patient Name: [name] Date of Service: 07/11/2015 10:00 AM Medical Record Number: [id number] Patient Account Number: [id number] Date of Birth/Sex: 05/15/1929 (81 y.o. Female) Treating RN: Cornell Barman Primary Care Physician: Lorane Gell Other Clinician: Referring Physician: SELF, REFERRED Treating Physician/Extender: BURNS III, WALTER Weeks in Treatment: 0 Wound Status Wound Number: 3 Primary Etiology: Trauma, Other Wound Location: Right Lower Leg - Medial Wound Status: Open Wounding Event: Trauma Date Acquired: 07/04/2015 Weeks Of Treatment: 0 Clustered Wound: No Photos Photo Uploaded By: Gretta Cool, RN, BSN, Kim on 07/11/2015 15:37:09 Wound Measurements Length: (cm) 2.4 Width: (cm) 1.5 Depth: (cm) 0.2 Area: (cm) 2.827 Volume: (cm) 0.565 % Reduction in Area: % Reduction in Volume: Tunneling: No Undermining: No Wound Description Full Thickness Without Exposed Classification: Support Structures Wound Margin: Flat and Intact Exudate None Present Amount: Wound Bed Granulation Amount: None Present (0%) Exposed Structure Necrotic Amount: Large (67-100%) Fascia Exposed: No Necrotic Quality: Eschar Fat Layer Exposed: No Tendon Exposed: No Muscle Exposed: No Mauch, Amilliana W. ([id number]) Joint Exposed: No Bone Exposed: No Limited to Skin Breakdown Periwound Skin Texture Texture Color No Abnormalities Noted: No No Abnormalities Noted: No Callus: No Atrophie Blanche: No Crepitus: No Cyanosis: No Excoriation: No Ecchymosis: No Fluctuance: No Erythema: No Friable:  No Hemosiderin Staining: No Induration: No Mottled: No Localized Edema: Yes Pallor: No Rash: No Rubor: No Scarring: No Moisture No Abnormalities Noted: No Dry / Scaly: No Maceration: No Moist: No Wound Preparation Ulcer Cleansing: Not Cleansed Topical Anesthetic Applied: Other: lidocaine 4%, Treatment Notes Wound #3 (Right, Medial Lower Leg) 1. Cleansed with: Clean wound with Normal Saline 2. Anesthetic Topical Lidocaine 4% cream to wound bed prior to debridement 4. Dressing Applied: Prisma Ag 5. Secondary Dressing Applied Bordered Foam Dressing 7. Secured with Financial risk analyst) Signed: 07/13/2015 3:49:12 PM By: Gretta Cool, RN, BSN, Kim RN, BSN Entered By: Gretta Cool, RN, BSN, Kim on 07/11/2015 10:46:39 Stefanick, [name] ([id number]) -------------------------------------------------------------------------------- Wound Assessment Details Patient Name: [name] Date of Service: 07/11/2015 10:00 AM Medical Record Number: [id number] Patient Account Number: [id number] Date of Birth/Sex: 03-04-1929 (81 y.o. Female) Treating RN: Cornell Barman Primary Care Physician: Lorane Gell Other Clinician: Referring Physician: SELF, REFERRED Treating Physician/Extender: BURNS III, WALTER Weeks in Treatment: 0 Wound Status Wound Number: 4 Primary Etiology: Trauma, Other Wound Location: Right Lower Leg - Lateral Wound Status: Open Wounding Event: Trauma Date Acquired: 06/19/2015 Weeks Of Treatment: 0 Clustered Wound: No Photos Photo Uploaded By: Gretta Cool, RN, BSN, Kim on 07/11/2015 15:37:10 Wound Measurements Length: (cm) 3.4 % Reduction Width: (cm) 1.5 % Reduction Depth: (cm) 0.1 Area: (cm) 4.006 Volume: (cm) 0.401 in Area: in Volume: Wound Description Classification: Partial Thickness Wound Margin: Flat and Intact Wound Bed Necrotic Amount: Large (67-100%) Exposed Structure Necrotic Quality: Eschar Fascia Exposed: No Fat Layer Exposed: No Tendon Exposed:  No Muscle Exposed: No Joint Exposed: No Bone Exposed: No Legaspi, Samary W. ([id number]) Limited to Skin Breakdown Periwound Skin Texture Texture Color No Abnormalities Noted: No No Abnormalities Noted: No Callus: No Atrophie Blanche: No Crepitus: No Cyanosis: No Excoriation: No Ecchymosis: No Fluctuance: No Erythema: No Friable: No Hemosiderin Staining: No Induration: No Mottled: No Localized Edema: No Pallor: No Rash: No Rubor: No Scarring: No Moisture No Abnormalities Noted: No Dry /  Scaly: No Maceration: No Moist: No Wound Preparation Ulcer Cleansing: Rinsed/Irrigated with Saline Topical Anesthetic Applied: Other: lidocaine 4%, Treatment Notes Wound #4 (Right, Lateral Lower Leg) 1. Cleansed with: Clean wound with Normal Saline 2. Anesthetic Topical Lidocaine 4% cream to wound bed prior to debridement 4. Dressing Applied: Prisma Ag 5. Secondary Dressing Applied Bordered Foam Dressing 7. Secured with Financial risk analyst) Signed: 07/13/2015 3:49:12 PM By: Gretta Cool, RN, BSN, Kim RN, BSN Entered By: Gretta Cool, RN, BSN, Kim on 07/11/2015 10:47:54 Coudriet, [name] ([id number]) -------------------------------------------------------------------------------- Wound Assessment Details Patient Name: [name] Date of Service: 07/11/2015 10:00 AM Medical Record Number: [id number] Patient Account Number: [id number] Date of Birth/Sex: 01-02-1929 (81 y.o. Female) Treating RN: Cornell Barman Primary Care Physician: Lorane Gell Other Clinician: Referring Physician: SELF, REFERRED Treating Physician/Extender: BURNS III, WALTER Weeks in Treatment: 0 Wound Status Wound Number: 5 Primary Etiology: Trauma, Other Wound Location: Hand - Web Between 2nd and Wound Status: Open 3rd Digit - Circumfernential Wounding Event: Trauma Date Acquired: 07/11/2015 Weeks Of Treatment: 0 Clustered Wound: No Photos Photo Uploaded By: Gretta Cool, RN, BSN, Kim on 07/11/2015  15:37:10 Wound Measurements Length: (cm) 0.1 % Reduction Width: (cm) 1.2 % Reduction Depth: (cm) 0.1 Area: (cm) 0.094 Volume: (cm) 0.009 in Area: in Volume: Wound Description Classification: Partial Thickness Wound Margin: Flat and Intact Exudate Amount: None Present Foul Odor After Cleansing: No Wound Bed Granulation Amount: None Present (0%) Exposed Structure Necrotic Amount: None Present (0%) Fascia Exposed: No Fat Layer Exposed: No Tendon Exposed: No Muscle Exposed: No Kleist, Aixa W. ([id number]) Joint Exposed: No Bone Exposed: No Limited to Skin Breakdown Periwound Skin Texture Texture Color No Abnormalities Noted: No No Abnormalities Noted: No Callus: No Atrophie Blanche: No Crepitus: No Cyanosis: No Excoriation: No Ecchymosis: No Fluctuance: No Erythema: No Friable: No Hemosiderin Staining: No Induration: No Mottled: No Localized Edema: No Pallor: No Rash: No Rubor: No Scarring: No Moisture No Abnormalities Noted: No Dry / Scaly: No Maceration: No Moist: No Wound Preparation Ulcer Cleansing: Rinsed/Irrigated with Saline Topical Anesthetic Applied: None Electronic Signature(s) Signed: 07/13/2015 3:49:12 PM By: Gretta Cool, RN, BSN, Kim RN, BSN Entered By: Gretta Cool, RN, BSN, Kim on 07/11/2015 10:50:33 Boyadjian, [name] ([id number]) -------------------------------------------------------------------------------- Vitals Details Patient Name: [name] Date of Service: 07/11/2015 10:00 AM Medical Record Number: [id number] Patient Account Number: [id number] Date of Birth/Sex: 02-Jul-1929 (81 y.o. Female) Treating RN: Cornell Barman Primary Care Physician: Lorane Gell Other Clinician: Referring Physician: SELF, REFERRED Treating Physician/Extender: BURNS III, WALTER Weeks in Treatment: 0 Vital Signs Time Taken: 10:37 Temperature (F): 97.5 Height (in): 60 Pulse (bpm): 62 Source: Stated Respiratory Rate (breaths/min): 18 Weight (lbs):  128 Blood Pressure (mmHg): 142/69 Source: Stated Reference Range: 80 - 120 mg / dl Body Mass Index (BMI): 25 Electronic Signature(s) Signed: 07/13/2015 3:49:12 PM By: Gretta Cool, RN, BSN, Kim RN, BSN Entered By: Gretta Cool, RN, BSN, Kim on 07/11/2015 10:37:43

## 2015-07-14 NOTE — Progress Notes (Signed)
[name], [name] ([id number]) Visit Report for 07/11/2015 Abuse/Suicide Risk Screen Details Patient Name: [name], [name] Date of Service: 07/11/2015 10:00 AM Medical Record Patient Account Number: [id number] Number: Treating RN: Cornell Barman 1929-05-21 (81 y.o. Other Clinician: Date of Birth/Sex: Female) Treating BURNS III, Primary Care Physician: Lorane Gell Physician/Extender: Thayer Jew Referring Physician: SELF, REFERRED Weeks in Treatment: 0 Abuse/Suicide Risk Screen Items Answer ABUSE/SUICIDE RISK SCREEN: Has anyone close to you tried to hurt or harm you recentlyo No Do you feel uncomfortable with anyone in your familyo No Has anyone forced you do things that you didnot want to doo No Do you have any thoughts of harming yourselfo No Patient displays signs or symptoms of abuse and/or neglect. No Electronic Signature(s) Signed: 07/13/2015 3:49:12 PM By: Gretta Cool, RN, BSN, Kim RN, BSN Entered By: Gretta Cool, RN, BSN, Kim on 07/11/2015 10:54:20 Huckins, [name] ([id number]) -------------------------------------------------------------------------------- Activities of Daily Living Details Patient Name: [name] Date of Service: 07/11/2015 10:00 AM Medical Record Patient Account Number: [id number] Number: Treating RN: Cornell Barman 1928/12/01 (81 y.o. Other Clinician: Date of Birth/Sex: Female) Treating BURNS III, Primary Care Physician: Lorane Gell Physician/Extender: Thayer Jew Referring Physician: SELF, REFERRED Weeks in Treatment: 0 Activities of Daily Living Items Answer Activities of Daily Living (Please select one for each item) Drive Automobile Completely Able Take Medications Completely Able Use Telephone Completely Able Care for Appearance Completely Able Use Toilet Completely Able Bath / Shower Completely Able Dress Self Completely Able Feed Self Completely Able Walk Completely Able Get In / Out Bed Completely Able Housework Completely  Able Prepare Meals Completely [place] for Self Completely Able Electronic Signature(s) Signed: 07/13/2015 3:49:12 PM By: Gretta Cool, RN, BSN, Kim RN, BSN Entered By: Gretta Cool, RN, BSN, Kim on 07/11/2015 10:54:32 [place], [name] ([id number]) -------------------------------------------------------------------------------- Education Assessment Details Patient Name: [name] Date of Service: 07/11/2015 10:00 AM Medical Record Patient Account Number: [id number] Number: Treating RN: Cornell Barman [birth year] (81 y.o. Other Clinician: Date of Birth/Sex: Female) Treating BURNS III, Primary Care Physician: Lorane Gell Physician/Extender: Thayer Jew Referring Physician: SELF, REFERRED Weeks in Treatment: 0 Primary Learner Assessed: Patient Learning Preferences/Education Level/Primary Language Learning Preference: Explanation, Demonstration Highest Education Level: High School Preferred Language: English Cognitive Barrier Assessment/Beliefs Language Barrier: No Translator Needed: No Memory Deficit: No Emotional Barrier: No Cultural/Religious Beliefs Affecting Medical No Care: Physical Barrier Assessment Impaired Vision: Yes Impaired Hearing: No Decreased Hand dexterity: No Knowledge/Comprehension Assessment Knowledge Level: High Comprehension Level: High Ability to understand written High instructions: Ability to understand verbal High instructions: Motivation Assessment Anxiety Level: Calm Cooperation: Cooperative Education Importance: Acknowledges Need Interest in Health Problems: Asks Questions Perception: Coherent Willingness to Engage in Self- High Management Activities: Readiness to Engage in Self- High Management Activities: [name], [name] ([id number]) Electronic Signature(s) Signed: 07/13/2015 3:49:12 PM By: Gretta Cool, RN, BSN, Kim RN, BSN Entered By: Gretta Cool, RN, BSN, Kim on 07/11/2015 10:55:04 [place]ero, [name]  ([id number]) -------------------------------------------------------------------------------- Fall Risk Assessment Details Patient Name: [name] Date of Service: 07/11/2015 10:00 AM Medical Record Patient Account Number: [id number] Number: Treating RN: Cornell Barman 17-Apr-1929 (81 y.o. Other Clinician: Date of Birth/Sex: Female) Treating BURNS III, Primary Care Physician: Lorane Gell Physician/Extender: Thayer Jew Referring Physician: SELF, REFERRED Weeks in Treatment: 0 Fall Risk Assessment Items Have you had 2 or more falls in the last 12 monthso 0 No Have you had any fall that resulted in injury in the last 12 monthso 0 No FALL RISK ASSESSMENT: History of falling -  immediate or within 3 months 0 No Secondary diagnosis 0 No Ambulatory aid None/bed rest/wheelchair/nurse 0 Yes Crutches/cane/walker 0 No Furniture 0 No IV Access/Saline Lock 0 No Gait/Training Normal/bed rest/immobile 0 Yes Weak 0 No Impaired 0 No Mental Status Oriented to own ability 0 Yes Electronic Signature(s) Signed: 07/13/2015 3:49:12 PM By: Gretta Cool, RN, BSN, Kim RN, BSN Entered By: Gretta Cool, RN, BSN, Kim on 07/11/2015 10:55:33 [place], [name] ([id number]) -------------------------------------------------------------------------------- Foot Assessment Details Patient Name: [name] Date of Service: 07/11/2015 10:00 AM Medical Record Patient Account Number: [id number] Number: Treating RN: Cornell Barman 1928-09-08 (81 y.o. Other Clinician: Date of Birth/Sex: Female) Treating BURNS III, Primary Care Physician: Lorane Gell Physician/Extender: Thayer Jew Referring Physician: SELF, REFERRED Weeks in Treatment: 0 Foot Assessment Items Site Locations + = Sensation present, - = Sensation absent, C = Callus, U = Ulcer R = Redness, W = Warmth, M = Maceration, PU = Pre-ulcerative lesion F = Fissure, S = Swelling, D = Dryness Assessment Right: Left: Other Deformity: No No Prior Foot  Ulcer: No No Prior Amputation: No No Charcot Joint: No No Ambulatory Status: Ambulatory Without Help Gait: Steady Electronic Signature(s) Signed: 07/13/2015 3:49:12 PM By: Gretta Cool, RN, BSN, Kim RN, BSN Entered By: Gretta Cool, RN, BSN, Kim on 07/11/2015 10:55:59 [place], [name] ([id number]) -------------------------------------------------------------------------------- Nutrition Risk Assessment Details Patient Name: [name] Date of Service: 07/11/2015 10:00 AM Medical Record Patient Account Number: [id number] Number: Treating RN: Cornell Barman 1928/09/19 (81 y.o. Other Clinician: Date of Birth/Sex: Female) Treating BURNS III, Primary Care Physician: Lorane Gell Physician/Extender: Thayer Jew Referring Physician: SELF, REFERRED Weeks in Treatment: 0 Height (in): 60 Weight (lbs): 128 Body Mass Index (BMI): 25 Nutrition Risk Assessment Items NUTRITION RISK SCREEN: I have an illness or condition that made me change the kind and/or 0 No amount of food I eat I eat fewer than two meals per day 0 No I eat few fruits and vegetables, or milk products 0 No I have three or more drinks of beer, liquor or wine almost every day 0 No I have tooth or mouth problems that make it hard for me to eat 0 No I don't always have enough money to buy the food I need 0 No I eat alone most of the time 0 No I take three or more different prescribed or over-the-counter drugs a 0 No day Without wanting to, I have lost or gained 10 pounds in the last six 0 No months I am not always physically able to shop, cook and/or feed myself 0 No Nutrition Protocols Good Risk Protocol 0 No interventions needed Moderate Risk Protocol Electronic Signature(s) Signed: 07/13/2015 3:49:12 PM By: Gretta Cool, RN, BSN, Kim RN, BSN Entered By: Gretta Cool, RN, BSN, Kim on 07/11/2015 10:55:40

## 2015-07-14 NOTE — Progress Notes (Signed)
RAVENNE, RINGOLD ([id number]) Visit Report for 07/11/2015 Chief Complaint Document Details Patient Name: [name], [name] Date of Service: 07/11/2015 10:00 AM Medical Record Patient Account Number: [id number] Number: Treating RN: Cornell Barman 26-Oct-1928 (81 y.o. Other Clinician: Date of Birth/Sex: Female) Treating BURNS III, Primary Care Physician: Lorane Gell Physician/Extender: Thayer Jew Referring Physician: SELF, REFERRED Weeks in Treatment: 0 Information Obtained from: Patient Chief Complaint Right calf traumatic ulceration o2. Right hand skin tear. Electronic Signature(s) Signed: 07/11/2015 2:46:32 PM By: Loletha Grayer MD Entered By: Loletha Grayer on 07/11/2015 14:03:12 [name], [name] ([id number]) -------------------------------------------------------------------------------- Debridement Details Patient Name: [name] Date of Service: 07/11/2015 10:00 AM Medical Record Patient Account Number: [id number] Number: Treating RN: Cornell Barman 1929-01-05 (81 y.o. Other Clinician: Date of Birth/Sex: Female) Treating BURNS III, Primary Care Physician: Lorane Gell Physician/Extender: Thayer Jew Referring Physician: SELF, REFERRED Weeks in Treatment: 0 Debridement Performed for Wound #3 Right,Medial Lower Leg Assessment: Performed By: Physician BURNS III, Teressa Senter., MD Debridement: Debridement Pre-procedure Yes Verification/Time Out Taken: Start Time: 11:10 Pain Control: Other : lidocaine 4% Level: Skin/Subcutaneous Tissue Total Area Debrided (L x 2.1 (cm) x 1.5 (cm) = 3.15 (cm) W): Tissue and other Viable, Non-Viable, Eschar, Fat, Fibrin/Slough, Skin, Subcutaneous material debrided: Instrument: Curette Bleeding: Minimum Hemostasis Achieved: Pressure End Time: 11:14 Procedural Pain: 0 Post Procedural Pain: 0 Response to Treatment: Procedure was tolerated well Post Debridement Measurements of Total Wound Length: (cm) 2.1 Width: (cm)  1.5 Depth: (cm) 0.3 Volume: (cm) 0.742 Post Procedure Diagnosis Same as Pre-procedure Electronic Signature(s) Signed: 07/11/2015 2:46:32 PM By: Loletha Grayer MD Signed: 07/13/2015 3:49:12 PM By: Gretta Cool RN, BSN, Kim RN, BSN Entered By: Loletha Grayer on 07/11/2015 14:02:31 [name], [name] ([id number]) -------------------------------------------------------------------------------- Debridement Details Patient Name: [name] Date of Service: 07/11/2015 10:00 AM Medical Record Patient Account Number: [id number] Number: Treating RN: Cornell Barman Feb 27, 1929 (81 y.o. Other Clinician: Date of Birth/Sex: Female) Treating BURNS III, Primary Care Physician: Lorane Gell Physician/Extender: Thayer Jew Referring Physician: SELF, REFERRED Weeks in Treatment: 0 Debridement Performed for Wound #4 Right,Lateral Lower Leg Assessment: Performed By: Physician BURNS III, Teressa Senter., MD Debridement: Debridement Pre-procedure Yes Verification/Time Out Taken: Start Time: 11:10 Pain Control: Other : lidocaine 4% Level: Skin/Subcutaneous Tissue Total Area Debrided (L x 3.4 (cm) x 1.5 (cm) = 5.1 (cm) W): Tissue and other Viable, Non-Viable, Eschar, Fat, Fibrin/Slough, Skin, Subcutaneous material debrided: Instrument: Curette Bleeding: Minimum Hemostasis Achieved: Pressure End Time: 11:14 Procedural Pain: 0 Post Procedural Pain: 0 Response to Treatment: Procedure was tolerated well Post Debridement Measurements of Total Wound Length: (cm) 3.4 Width: (cm) 1.5 Depth: (cm) 0.2 Volume: (cm) 0.801 Post Procedure Diagnosis Same as Pre-procedure Electronic Signature(s) Signed: 07/11/2015 2:46:32 PM By: Loletha Grayer MD Signed: 07/13/2015 3:49:12 PM By: Gretta Cool RN, BSN, Kim RN, BSN Entered By: Loletha Grayer on 07/11/2015 14:02:48 [name], [name] ([id number]) -------------------------------------------------------------------------------- HPI Details Patient  Name: [name] Date of Service: 07/11/2015 10:00 AM Medical Record Patient Account Number: [id number] Number: Treating RN: Cornell Barman 04-15-1929 (81 y.o. Other Clinician: Date of Birth/Sex: Female) Treating BURNS III, Primary Care Physician: Lorane Gell Physician/Extender: Thayer Jew Referring Physician: SELF, REFERRED Weeks in Treatment: 0 History of Present Illness HPI Description: Pleasant 81 year old who came with a history of Arthritis;GERD (gastroesophageal reflux disease);Hypertension;Chronic kidney diseaseo;Vasculitis, ANCA positive, pulmonary hemorrhage;Thyroid disease;hyperthyroid, had radioactiveiodine treatment;Renal cell carcinoma 2003 s/p sgy for removal right kidney;Hemorrhoids.o he has also had several surgeries including abdominal hysterectomy with bilateral salpingo-oophorectomy, hernia repair,  fracture of her femur on the right side, cholecystectomy, cardiac catheterization. She traumatized her right calf at 2 locations in November 2016. [place] today and suffered a skin tear to her right hand. Ambulating per her baseline. No significant pain. Right ABI 0.75. No fever or chills. Minimal drainage. Electronic Signature(s) Signed: 07/11/2015 2:46:32 PM By: Loletha Grayer MD Entered By: Loletha Grayer on 07/11/2015 14:04:50 [name], [name] ([id number]) -------------------------------------------------------------------------------- Physical Exam Details Patient Name: [name] Date of Service: 07/11/2015 10:00 AM Medical Record Patient Account Number: [id number] Number: Treating RN: Cornell Barman 1929-07-05 (81 y.o. Other Clinician: Date of Birth/Sex: Female) Treating BURNS III, Primary Care Physician: Lorane Gell Physician/Extender: Thayer Jew Referring Physician: SELF, REFERRED Weeks in Treatment: 0 Constitutional . Pulse regular. Respirations normal and unlabored. Afebrile. Marland Kitchen Respiratory WNL. No  retractions.. Cardiovascular Pedal Pulses WNL. Integumentary (Hair, Skin) .Marland Kitchen Neurological Sensation normal to touch, pin,and vibration. Psychiatric Judgement and insight Intact.. Oriented times 3.. No evidence of depression, anxiety, or agitation.. Notes Right lateral and medial calf ulcerations. Both full-thickness. No evidence for infection. Localized edema. Faintly palpable DP. Right ABI 0.75. Right hand skin tear. Skin flap in anatomic position. Electronic Signature(s) Signed: 07/11/2015 2:46:32 PM By: Loletha Grayer MD Entered By: Loletha Grayer on 07/11/2015 14:14:36 [name], [name] ([id number]) -------------------------------------------------------------------------------- Physician Orders Details Patient Name: [name] Date of Service: 07/11/2015 10:00 AM Medical Record Patient Account Number: [id number] Number: Treating RN: Cornell Barman 1928/11/06 (81 y.o. Other Clinician: Date of Birth/Sex: Female) Treating BURNS III, Primary Care Physician: Lorane Gell Physician/Extender: Thayer Jew Referring Physician: SELF, REFERRED Weeks in Treatment: 0 Verbal / Phone Orders: Yes Clinician: Cornell Barman Read Back and Verified: Yes Diagnosis Coding Wound Cleansing Wound #3 Right,Medial Lower Leg o Clean wound with Normal Saline. Wound #4 Right,Lateral Lower Leg o Clean wound with Normal Saline. Wound #5 Circumferential Hand - Web Between 2nd and 3rd Digit o Clean wound with Normal Saline. Anesthetic Wound #3 Right,Medial Lower Leg o Topical Lidocaine 4% cream applied to wound bed prior to debridement Wound #5 Circumferential Hand - Web Between 2nd and 3rd Digit o Topical Lidocaine 4% cream applied to wound bed prior to debridement Primary Wound Dressing Wound #3 Right,Medial Lower Leg o Aquacel Ag Wound #4 Right,Lateral Lower Leg o Aquacel Ag Wound #5 Circumferential Hand - Web Between 2nd and 3rd Digit o Other: -  Neosporin Secondary Dressing Wound #3 Right,Medial Lower Leg o Boardered Foam Dressing Wound #4 Right,Lateral Lower Leg o Boardered Foam Dressing [name], Suzanne W. ([id number]) Dressing Change Frequency Wound #3 Right,Medial Lower Leg o Change dressing every other day. Wound #4 Right,Lateral Lower Leg o Change dressing every other day. Wound #5 Circumferential Hand - Web Between 2nd and 3rd Digit o Change dressing every day. Follow-up Appointments Wound #3 Right,Medial Lower Leg o Return Appointment in 1 week. Wound #4 Right,Lateral Lower Leg o Return Appointment in 1 week. Wound #5 Circumferential Hand - Web Between 2nd and 3rd Digit o Return Appointment in 1 week. Edema Control Wound #3 Right,Medial Lower Leg o Tubigrip Wound #4 Right,Lateral Lower Leg o Tubigrip Electronic Signature(s) Signed: 07/11/2015 2:46:32 PM By: Loletha Grayer MD Signed: 07/13/2015 3:49:12 PM By: Gretta Cool, RN, BSN, Kim RN, BSN Entered By: Gretta Cool, RN, BSN, Kim on 07/11/2015 11:17:28 [name], [name] ([id number]) -------------------------------------------------------------------------------- Problem List Details Patient Name: [name] Date of Service: 07/11/2015 10:00 AM Medical Record Patient Account Number: [id number] Number: Treating RN: Cornell Barman Jul 21, 1929 (81 y.o. Other Clinician: Date  of Birth/Sex: Female) Treating BURNS III, Primary Care Physician: Lorane Gell Physician/Extender: Thayer Jew Referring Physician: SELF, REFERRED Weeks in Treatment: 0 Active Problems ICD-10 Encounter Code Description Active Date Diagnosis L97.212 Non-pressure chronic ulcer of right calf with fat layer 07/11/2015 Yes exposed S60.511A Abrasion of right hand, initial encounter 07/11/2015 Yes R60.0 Localized edema 07/11/2015 Yes Inactive Problems Resolved Problems Electronic Signature(s) Signed: 07/11/2015 2:46:32 PM By: Loletha Grayer MD Entered By: Loletha Grayer  on 07/11/2015 14:02:10 [name], [name] ([id number]) -------------------------------------------------------------------------------- Progress Note/History and Physical Details Patient Name: [name] Date of Service: 07/11/2015 10:00 AM Medical Record Patient Account Number: [id number] Number: Treating RN: Cornell Barman [birth year] (81 y.o. Other Clinician: Date of Birth/Sex: Female) Treating BURNS III, Primary Care Physician: Lorane Gell Physician/Extender: Thayer Jew Referring Physician: SELF, REFERRED Weeks in Treatment: 0 Subjective Chief Complaint Information obtained from Patient Right calf traumatic ulceration o2. Right hand skin tear. History of Present Illness (HPI) Pleasant 81 year old who came with a history of Arthritis;GERD (gastroesophageal reflux disease);Hypertension;Chronic kidney disease ;Vasculitis, ANCA positive, pulmonary hemorrhage;Thyroid disease;hyperthyroid, had radioactiveiodine treatment;Renal cell carcinoma 2003 s/p sgy for removal right kidney;Hemorrhoids. he has also had several surgeries including abdominal hysterectomy with bilateral salpingo-oophorectomy, hernia repair, fracture of her femur on the right side, cholecystectomy, cardiac catheterization. She traumatized her right calf at 2 locations in November 2016. [place] today and suffered a skin tear to her right hand. Ambulating per her baseline. No significant pain. Right ABI 0.75. No fever or chills. Minimal drainage. Wound History Patient presents with 3 open wounds that have been present for approximately 4 weeks. Patient has been treating wounds in the following manner: dry gauze and conform. Laboratory tests have not been performed in the last month. Patient reportedly has not tested positive for an antibiotic resistant organism. Patient reportedly has not tested positive for osteomyelitis. Patient reportedly has not had testing performed to evaluate circulation in the legs.  Patient experiences the following problems associated with their wounds: swelling. Patient History Information obtained from Patient. Allergies amoxicillin (Reaction: itching), NSAIDS (Non-Steroidal Anti-Inflammatory Drug), tolmetin Eckersley, Elena W. ([id number]) Family History Heart Disease - Father, No family history of Cancer, Diabetes, Hereditary Spherocytosis, Hypertension, Kidney Disease, Lung Disease, Seizures, Stroke, Thyroid Problems, Tuberculosis. Social History Never smoker, Marital Status - Widowed, Alcohol Use - Never, Drug Use - No History, Caffeine Use - Daily. Medical History Eyes Patient has history of Cataracts - removed (B) Denies history of Glaucoma, Optic Neuritis Ear/Nose/Mouth/Throat Patient has history of Chronic sinus problems/congestion Denies history of Middle ear problems Hematologic/Lymphatic Denies history of Anemia, Hemophilia, Human Immunodeficiency Virus, Lymphedema, Sickle Cell Disease Respiratory Denies history of Aspiration, Asthma, Chronic Obstructive Pulmonary Disease (COPD), Pneumothorax, Sleep Apnea, Tuberculosis Cardiovascular Patient has history of Angina, Hypertension, Vasculitis Denies history of Arrhythmia, Congestive Heart Failure, Coronary Artery Disease, Deep Vein Thrombosis, Hypotension, Myocardial Infarction, Peripheral Arterial Disease, Peripheral Venous Disease, Phlebitis Gastrointestinal Denies history of Cirrhosis , Colitis, Crohn s, Hepatitis A, Hepatitis B, Hepatitis C Endocrine Denies history of Type I Diabetes, Type II Diabetes Integumentary (Skin) Denies history of History of Burn, History of pressure wounds Musculoskeletal Patient has history of Osteoarthritis Denies history of Gout, Rheumatoid Arthritis, Osteomyelitis Neurologic Denies history of Dementia, Neuropathy, Quadriplegia, Paraplegia, Seizure Disorder Oncologic Denies history of Received Chemotherapy, Received Radiation Psychiatric Denies history of  Anorexia/bulimia, Confinement Anxiety Hospitalization/Surgery History - 08/04/1998, [facility], Partial (L) kidney removal- [place]. Medical And Surgical History Notes Constitutional Symptoms (General Health) HTN; vasculitis; tumor removed from kidney Gastrointestinal hiatal hernia; chronic constipation Genitourinary  Kidney [place]- (L) partial removal Immunological compromised immune system [name], Suzanne W. ([id number]) Oncologic Kidney CA (L) Review of Systems (ROS) Constitutional Symptoms (General Health) The patient has no complaints or symptoms. Eyes The patient has no complaints or symptoms. Ear/Nose/Mouth/Throat The patient has no complaints or symptoms. Hematologic/Lymphatic Complains or has symptoms of Bleeding / Clotting Disorders. Denies complaints or symptoms of Human Immunodeficiency Virus. Respiratory The patient has no complaints or symptoms. Cardiovascular Complains or has symptoms of LE edema. Denies complaints or symptoms of Chest pain. Gastrointestinal The patient has no complaints or symptoms. Endocrine The patient has no complaints or symptoms. Genitourinary The patient has no complaints or symptoms. Immunological The patient has no complaints or symptoms. Integumentary (Skin) Complains or has symptoms of Wounds, Bleeding or bruising tendency, Swelling - legs. Denies complaints or symptoms of Breakdown. Musculoskeletal The patient has no complaints or symptoms. Neurologic The patient has no complaints or symptoms. Oncologic The patient has no complaints or symptoms. Psychiatric The patient has no complaints or symptoms. Objective Constitutional Pulse regular. Respirations normal and unlabored. Afebrile. Vitals Time Taken: 10:37 AM, Height: 60 in, Source: Stated, Weight: 128 lbs, Source: Stated, BMI: 25, Temperature: 97.5 F, Pulse: 62 bpm, Respiratory Rate: 18 breaths/min, Blood Pressure: 142/69 mmHg. Rudell, Kamyiah W. ([id number]) Respiratory WNL. No  retractions.. Cardiovascular Pedal Pulses WNL. Neurological Sensation normal to touch, pin,and vibration. Psychiatric Judgement and insight Intact.. Oriented times 3.. No evidence of depression, anxiety, or agitation.. General Notes: Right lateral and medial calf ulcerations. Both full-thickness. No evidence for infection. Localized edema. Faintly palpable DP. Right ABI 0.75. Right hand skin tear. Skin flap in anatomic position. Integumentary (Hair, Skin) Wound #3 status is Open. Original cause of wound was Trauma. The wound is located on the Right,Medial Lower Leg. The wound measures 2.4cm length x 1.5cm width x 0.2cm depth; 2.827cm^2 area and 0.565cm^3 volume. The wound is limited to skin breakdown. There is no tunneling or undermining noted. There is a none present amount of drainage noted. The wound margin is flat and intact. There is no granulation within the wound bed. There is a large (67-100%) amount of necrotic tissue within the wound bed including Eschar. The periwound skin appearance exhibited: Localized Edema. The periwound skin appearance did not exhibit: Callus, Crepitus, Excoriation, Fluctuance, Friable, Induration, Rash, Scarring, Dry/Scaly, Maceration, Moist, Atrophie Blanche, Cyanosis, Ecchymosis, Hemosiderin Staining, Mottled, Pallor, Rubor, Erythema. Wound #4 status is Open. Original cause of wound was Trauma. The wound is located on the Right,Lateral Lower Leg. The wound measures 3.4cm length x 1.5cm width x 0.1cm depth; 4.006cm^2 area and 0.401cm^3 volume. The wound is limited to skin breakdown. The wound margin is flat and intact. There is a large (67-100%) amount of necrotic tissue within the wound bed including Eschar. The periwound skin appearance did not exhibit: Callus, Crepitus, Excoriation, Fluctuance, Friable, Induration, Localized Edema, Rash, Scarring, Dry/Scaly, Maceration, Moist, Atrophie Blanche, Cyanosis, Ecchymosis, Hemosiderin Staining, Mottled, Pallor,  Rubor, Erythema. Wound #5 status is Open. Original cause of wound was Trauma. The wound is located on the Circumferential Hand - Web Between 2nd and 3rd Digit. The wound measures 0.1cm length x 1.2cm width x 0.1cm depth; 0.094cm^2 area and 0.009cm^3 volume. The wound is limited to skin breakdown. There is a none present amount of drainage noted. The wound margin is flat and intact. There is no granulation within the wound bed. There is no necrotic tissue within the wound bed. The periwound skin appearance did not exhibit: Callus, Crepitus, Excoriation, Fluctuance, Friable, Induration, Localized Edema,  Rash, Scarring, Dry/Scaly, Maceration, Moist, Atrophie Blanche, Cyanosis, Ecchymosis, Hemosiderin Staining, Mottled, Pallor, Rubor, Erythema. [name], Suzanne HALLOWELL. ([id number]) Assessment Active Problems ICD-10 [phone] - Non-pressure chronic ulcer of right calf with fat layer exposed S60.511A - Abrasion of right hand, initial encounter R60.0 - Localized edema Right calf traumatic ulceration o2. Right hand skin tear. Procedures Wound #3 Wound #3 is a Trauma, Other located on the Right,Medial Lower Leg . There was a Skin/Subcutaneous Tissue Debridement [id number]) debridement with total area of 3.15 sq cm performed by BURNS III, Teressa Senter., MD. with the following instrument(s): Curette to remove Viable and Non-Viable tissue/material including Fat, Fibrin/Slough, Eschar, Skin, and Subcutaneous after achieving pain control using Other (lidocaine 4%). A time out was conducted prior to the start of the procedure. A Minimum amount of bleeding was controlled with Pressure. The procedure was tolerated well with a pain level of 0 throughout and a pain level of 0 following the procedure. Post Debridement Measurements: 2.1cm length x 1.5cm width x 0.3cm depth; 0.742cm^3 volume. Post procedure Diagnosis Wound #3: Same as Pre-Procedure Wound #4 Wound #4 is a Trauma, Other located on the Right,Lateral Lower  Leg . There was a Skin/Subcutaneous Tissue Debridement [id number]) debridement with total area of 5.1 sq cm performed by BURNS III, Teressa Senter., MD. with the following instrument(s): Curette to remove Viable and Non-Viable tissue/material including Fat, Fibrin/Slough, Eschar, Skin, and Subcutaneous after achieving pain control using Other (lidocaine 4%). A time out was conducted prior to the start of the procedure. A Minimum amount of bleeding was controlled with Pressure. The procedure was tolerated well with a pain level of 0 throughout and a pain level of 0 following the procedure. Post Debridement Measurements: 3.4cm length x 1.5cm width x 0.2cm depth; 0.801cm^3 volume. Post procedure Diagnosis Wound #4: Same as Pre-Procedure Plan [name], [name] ([id number]) Wound Cleansing: Wound #3 Right,Medial Lower Leg: Clean wound with Normal Saline. Wound #4 Right,Lateral Lower Leg: Clean wound with Normal Saline. Wound #5 Circumferential Hand - Web Between 2nd and 3rd Digit: Clean wound with Normal Saline. Anesthetic: Wound #3 Right,Medial Lower Leg: Topical Lidocaine 4% cream applied to wound bed prior to debridement Wound #5 Circumferential Hand - Web Between 2nd and 3rd Digit: Topical Lidocaine 4% cream applied to wound bed prior to debridement Primary Wound Dressing: Wound #3 Right,Medial Lower Leg: Aquacel Ag Wound #4 Right,Lateral Lower Leg: Aquacel Ag Wound #5 Circumferential Hand - Web Between 2nd and 3rd Digit: Other: - Neosporin Secondary Dressing: Wound #3 Right,Medial Lower Leg: Boardered Foam Dressing Wound #4 Right,Lateral Lower Leg: Boardered Foam Dressing Dressing Change Frequency: Wound #3 Right,Medial Lower Leg: Change dressing every other day. Wound #4 Right,Lateral Lower Leg: Change dressing every other day. Wound #5 Circumferential Hand - Web Between 2nd and 3rd Digit: Change dressing every day. Follow-up Appointments: Wound #3 Right,Medial Lower  Leg: Return Appointment in 1 week. Wound #4 Right,Lateral Lower Leg: Return Appointment in 1 week. Wound #5 Circumferential Hand - Web Between 2nd and 3rd Digit: Return Appointment in 1 week. Edema Control: Wound #3 Right,Medial Lower Leg: Tubigrip Wound #4 Right,Lateral Lower Leg: Tubigrip Mccaster, Dashanique W. ([id number]) Silver alginate. Tubigrip for edema control. Neosporin to right hand. Electronic Signature(s) Signed: 07/11/2015 2:46:32 PM By: Loletha Grayer MD Entered By: Loletha Grayer on 07/11/2015 14:16:27 Golda, [name] ([id number]) -------------------------------------------------------------------------------- ROS/PFSH Details Patient Name: [name] Date of Service: 07/11/2015 10:00 AM Medical Record Patient Account Number: [id number] Number: Treating RN: Cornell Barman 31-Aug-1928 (81 y.o. Other Clinician:  Date of Birth/Sex: Female) Treating BURNS III, Primary Care Physician: Lorane Gell Physician/Extender: Thayer Jew Referring Physician: SELF, REFERRED Weeks in Treatment: 0 Label Progress Note Print Version as History and Physical for this encounter Information Obtained From Patient Wound History Do you currently have one or more open woundso Yes How many open wounds do you currently haveo 3 Approximately how long have you had your woundso 4 weeks How have you been treating your wound(s) until nowo dry gauze and conform Has your wound(s) ever healed and then re-openedo No Have you had any lab work done in the past montho No Have you tested positive for an antibiotic resistant organism (MRSA, VRE)o No Have you tested positive for osteomyelitis (bone infection)o No Have you had any tests for circulation on your legso No Have you had other problems associated with your woundso Swelling Hematologic/Lymphatic Complaints and Symptoms: Positive for: Bleeding / Clotting Disorders Negative for: Human Immunodeficiency Virus Medical  History: Negative for: Anemia; Hemophilia; Human Immunodeficiency Virus; Lymphedema; Sickle Cell Disease Cardiovascular Complaints and Symptoms: Positive for: LE edema Negative for: Chest pain Medical History: Positive for: Angina; Hypertension; Vasculitis Negative for: Arrhythmia; Congestive Heart Failure; Coronary Artery Disease; Deep Vein Thrombosis; Hypotension; Myocardial Infarction; Peripheral Arterial Disease; Peripheral Venous Disease; Phlebitis Integumentary (Skin) Complaints and Symptoms: Positive for: Wounds; Bleeding or bruising tendency; Swelling - legs Negative for: ELISHIA, ROETHEL ([id number]) Medical History: Negative for: History of Burn; History of pressure wounds Psychiatric Complaints and Symptoms: No Complaints or Symptoms Complaints and Symptoms: Negative for: Anxiety; Claustrophobia Medical History: Negative for: Anorexia/bulimia; Confinement Anxiety Constitutional Symptoms (General Health) Complaints and Symptoms: No Complaints or Symptoms Medical History: Past Medical History Notes: HTN; vasculitis; tumor removed from kidney Eyes Complaints and Symptoms: No Complaints or Symptoms Medical History: Positive for: Cataracts - removed (B) Negative for: Glaucoma; Optic Neuritis Ear/Nose/Mouth/Throat Complaints and Symptoms: No Complaints or Symptoms Medical History: Positive for: Chronic sinus problems/congestion Negative for: Middle ear problems Respiratory Complaints and Symptoms: No Complaints or Symptoms Medical History: Negative for: Aspiration; Asthma; Chronic Obstructive Pulmonary Disease (COPD); Pneumothorax; Sleep Apnea; Tuberculosis Yamashiro, Girl W. ([id number]) Gastrointestinal Complaints and Symptoms: No Complaints or Symptoms Medical History: Negative for: Cirrhosis ; Colitis; Crohnos; Hepatitis A; Hepatitis B; Hepatitis C Past Medical History Notes: hiatal hernia; chronic constipation Endocrine Complaints and  Symptoms: No Complaints or Symptoms Medical History: Negative for: Type I Diabetes; Type II Diabetes Genitourinary Complaints and Symptoms: No Complaints or Symptoms Medical History: Past Medical History Notes: Kidney CA- (L) partial removal Immunological Complaints and Symptoms: No Complaints or Symptoms Medical History: Past Medical History Notes: compromised immune system Musculoskeletal Complaints and Symptoms: No Complaints or Symptoms Medical History: Positive for: Osteoarthritis Negative for: Gout; Rheumatoid Arthritis; Osteomyelitis Neurologic Complaints and Symptoms: No Complaints or Symptoms Medical History: Negative for: Dementia; Neuropathy; Quadriplegia; Paraplegia; Seizure Disorder Gwin, ZANIYLA [employer]. ([id number]) Oncologic Complaints and Symptoms: No Complaints or Symptoms Medical History: Negative for: Received Chemotherapy; Received Radiation Past Medical History Notes: Kidney CA (L) HBO Extended History Items Eyes: Ear/Nose/Mouth/Throat: Cataracts Chronic sinus problems/congestion Immunizations Tetanus Vaccine: Last tetanus shot: 12/25/2014 Hospitalization / Surgery History Name of Hospital Purpose of Hospitalization/Surgery Date [facility] Partial (L) kidney removal- [place] 08/04/1998 Family and Social History Cancer: No; Diabetes: No; Heart Disease: Yes - Father; Hereditary Spherocytosis: No; Hypertension: No; Kidney Disease: No; Lung Disease: No; Seizures: No; Stroke: No; Thyroid Problems: No; Tuberculosis: No; Never smoker; Marital Status - Widowed; Alcohol Use: Never; Drug Use: No History; Caffeine Use: Daily; Advanced Directives: Yes (Not Provided); Patient  does not want information on Advanced Directives; Do not resuscitate: No; Living Will: Yes (Not Provided); Medical Power of Attorney: Yes - daughter- Garry Heater (Not Provided) Physician Affirmation I have reviewed and agree with the above information. Electronic Signature(s) Signed: 07/11/2015  2:46:32 PM By: Loletha Grayer MD Signed: 07/13/2015 3:49:12 PM By: Gretta Cool RN, BSN, Kim RN, BSN Entered By: Loletha Grayer on 07/11/2015 14:16:14 Hnat, [name] ([id number]) -------------------------------------------------------------------------------- SuperBill Details Patient Name: [name] Date of Service: 07/11/2015 Medical Record Patient Account Number: [id number] Number: Treating RN: Cornell Barman Mar 30, 1929 (81 y.o. Other Clinician: Date of Birth/Sex: Female) Treating BURNS III, Primary Care Physician: Lorane Gell Physician/Extender: Thayer Jew Referring Physician: SELF, REFERRED Weeks in Treatment: 0 Diagnosis Coding ICD-10 Codes Code Description [phone] Non-pressure chronic ulcer of right calf with fat layer exposed S60.511A Abrasion of right hand, initial encounter R60.0 Localized edema Facility Procedures CPT4 Code: [id number] Description: [place] VISIT-LEV 3 EST PT Modifier: Quantity: 1 CPT4 Code: [id number] Description: [id number] - DEB SUBQ TISSUE 20 SQ CM/< ICD-10 Description Diagnosis L97.212 Non-pressure chronic ulcer of right calf with fat Modifier: layer exposed Quantity: 1 Physician Procedures CPT4 Code: [id number] Description: WC PHYS LEVEL 3 o NEW PT ICD-10 Description Diagnosis L97.212 Non-pressure chronic ulcer of right calf with fat S60.511A Abrasion of right hand, initial encounter Modifier: layer exposed Quantity: 1 CPT4 Code: [id number] Description: 11042 - WC PHYS SUBQ TISS 20 SQ CM ICD-10 Description Diagnosis L97.212 Non-pressure chronic ulcer of right calf with fat Modifier: layer exposed Quantity: 1 Electronic Signature(s) Signed: 07/11/2015 2:46:32 PM By: Loletha Grayer MD Entered By: Loletha Grayer on 07/11/2015 14:15:35

## 2015-07-18 ENCOUNTER — Encounter: Payer: Medicare Other | Admitting: Surgery

## 2015-07-18 DIAGNOSIS — L97212 Non-pressure chronic ulcer of right calf with fat layer exposed: Secondary | ICD-10-CM | POA: Diagnosis not present

## 2015-07-19 NOTE — Progress Notes (Signed)
ELMA, SANDNESS ([id number]) Visit Report for 07/18/2015 Chief Complaint Document Details Patient Name: [name], [name]. Date of Service: 07/18/2015 1:30 PM Medical Record Patient Account Number: [id number] Number: Treating RN: Baruch Gouty, RN, BSN, Velva Harman 09/30/1928 [phone] y.o. Other Clinician: Date of Birth/Sex: Female) Treating BURNS III, Primary Care Physician: Lorane Gell Physician/Extender: Thayer Jew Referring Physician: Carmine Savoy in Treatment: 1 Information Obtained from: Patient Chief Complaint Right calf traumatic ulceration o2. Right hand skin tear (healed). Electronic Signature(s) Signed: 07/18/2015 3:49:04 PM By: Loletha Grayer MD Entered By: Loletha Grayer on 07/18/2015 14:27:43 [name], [name] ([id number]) -------------------------------------------------------------------------------- Debridement Details Patient Name: [name] Date of Service: 07/18/2015 1:30 PM Medical Record Patient Account Number: [id number] Number: Treating RN: Baruch Gouty, RN, BSN, Rita 1929-03-01 [phone] y.o. Other Clinician: Date of Birth/Sex: Female) Treating BURNS III, Primary Care Physician: Lorane Gell Physician/Extender: Thayer Jew Referring Physician: Carmine Savoy in Treatment: 1 Debridement Performed for Wound #4 Right,Lateral Lower Leg Assessment: Performed By: Physician BURNS III, Teressa Senter., MD Debridement: Debridement Pre-procedure Yes Verification/Time Out Taken: Start Time: 13:29 Pain Control: Lidocaine 4% Topical Solution Level: Skin/Subcutaneous Tissue Total Area Debrided (L x 2.1 (cm) x 2 (cm) = 4.2 (cm) W): Tissue and other Viable, Non-Viable, Fat, Fibrin/Slough, Subcutaneous material debrided: Instrument: Curette Bleeding: Minimum Hemostasis Achieved: Pressure End Time: 13:33 Procedural Pain: 0 Post Procedural Pain: 0 Response to Treatment: Procedure was tolerated well Post Debridement Measurements of Total Wound Length: (cm)  2.1 Width: (cm) 2 Depth: (cm) 0.2 Volume: (cm) 0.66 Post Procedure Diagnosis Same as Pre-procedure Electronic Signature(s) Signed: 07/18/2015 3:49:04 PM By: Loletha Grayer MD Signed: 07/18/2015 5:16:39 PM By: Regan Lemming BSN, RN Entered By: Loletha Grayer on 07/18/2015 14:27:23 [name], [name] ([id number]) -------------------------------------------------------------------------------- HPI Details Patient Name: [name] Date of Service: 07/18/2015 1:30 PM Medical Record Patient Account Number: [id number] Number: Treating RN: Baruch Gouty, RN, BSN, Rita 1929-01-04 (81 y.o. Other Clinician: Date of Birth/Sex: Female) Treating BURNS III, Primary Care Physician: Lorane Gell Physician/Extender: Thayer Jew Referring Physician: Lorane Gell Weeks in Treatment: 1 History of Present Illness HPI Description: Pleasant 81 year old with history of Arthritis;GERD (gastroesophageal reflux disease);Hypertension;Chronic kidney diseaseo;Vasculitis, ANCA positive, pulmonary hemorrhage;Thyroid disease;hyperthyroid, had radioactiveiodine treatment;Renal cell carcinoma 2003 s/p sgy for removal right kidney;Hemorrhoids.o She has also had several surgeries including abdominal hysterectomy with bilateral salpingo-oophorectomy, hernia repair, fracture of her femur on the right side, cholecystectomy, cardiac catheterization. She traumatized her right calf at 2 locations in November 2016. More recently fell and suffered a skin tear to her right hand. Ambulating per her baseline. No significant pain. Right ABI 0.75. No fever or chills. Minimal drainage. Electronic Signature(s) Signed: 07/18/2015 3:49:04 PM By: Loletha Grayer MD Entered By: Loletha Grayer on 07/18/2015 14:28:47 [name], [name] ([id number]) -------------------------------------------------------------------------------- Physical Exam Details Patient Name: [name] Date of Service: 07/18/2015 1:30  PM Medical Record Patient Account Number: [id number] Number: Treating RN: Baruch Gouty, RN, BSN, Rita 1928/08/12 [phone] y.o. Other Clinician: Date of Birth/Sex: Female) Treating BURNS III, Primary Care Physician: Lorane Gell Physician/Extender: Thayer Jew Referring Physician: Lorane Gell Weeks in Treatment: 1 Constitutional . Pulse regular. Respirations normal and unlabored. Afebrile. . Notes Right lateral and medial calf ulcerations. Both full-thickness. No evidence for infection. Localized edema. Faintly palpable DP. Right ABI 0.75. Right hand skin tear is reepithelialized. Electronic Signature(s) Signed: 07/18/2015 3:49:04 PM By: Loletha Grayer MD Entered By: Loletha Grayer on 07/18/2015 14:29:34 [name], [name] ([id number]) -------------------------------------------------------------------------------- Physician Orders Details Patient Name: Suzanne Bougie  W. Date of Service: 07/18/2015 1:30 PM Medical Record Patient Account Number: [id number] Number: Treating RN: Baruch Gouty, RN, BSN, Velva Harman 08/09/1928 [phone] y.o. Other Clinician: Date of Birth/Sex: Female) Treating BURNS III, Primary Care Physician: Lorane Gell Physician/Extender: Thayer Jew Referring Physician: Carmine Savoy in Treatment: 1 Verbal / Phone Orders: Yes Clinician: Afful, RN, BSN, Rita Read Back and Verified: Yes Diagnosis Coding Wound Cleansing Wound #3 Right,Medial Lower Leg o Clean wound with Normal Saline. Wound #4 Right,Lateral Lower Leg o Clean wound with Normal Saline. Anesthetic Wound #3 Right,Medial Lower Leg o Topical Lidocaine 4% cream applied to wound bed prior to debridement Primary Wound Dressing Wound #3 Right,Medial Lower Leg o Aquacel Ag Wound #4 Right,Lateral Lower Leg o Aquacel Ag Secondary Dressing Wound #3 Right,Medial Lower Leg o Boardered Foam Dressing Wound #4 Right,Lateral Lower Leg o Boardered Foam Dressing Dressing Change Frequency Wound #3  Right,Medial Lower Leg o Change dressing every other day. Wound #4 Right,Lateral Lower Leg o Change dressing every other day. Follow-up Appointments Wound #3 Right,Medial Lower Leg Barrero, Maud W. ([id number]) o Return Appointment in 1 week. Wound #4 Right,Lateral Lower Leg o Return Appointment in 1 week. Edema Control Wound #3 Right,Medial Lower Leg o Tubigrip Wound #4 Right,Lateral Lower Leg o Tubigrip Electronic Signature(s) Signed: 07/18/2015 3:49:04 PM By: Loletha Grayer MD Signed: 07/18/2015 5:16:39 PM By: Regan Lemming BSN, RN Entered By: Regan Lemming on 07/18/2015 13:31:53 [name], [name] ([id number]) -------------------------------------------------------------------------------- Problem List Details Patient Name: [name] Date of Service: 07/18/2015 1:30 PM Medical Record Patient Account Number: [id number] Number: Treating RN: Baruch Gouty, RN, BSN, Rita [birth year] (81 y.o. Other Clinician: Date of Birth/Sex: Female) Treating BURNS III, Primary Care Physician: Lorane Gell Physician/Extender: Thayer Jew Referring Physician: Carmine Savoy in Treatment: 1 Active Problems ICD-10 Encounter Code Description Active Date Diagnosis L97.212 Non-pressure chronic ulcer of right calf with fat layer 07/11/2015 Yes exposed R60.0 Localized edema 07/11/2015 Yes Inactive Problems Resolved Problems ICD-10 Code Description Active Date Resolved Date S60.511A Abrasion of right hand, initial encounter 07/11/2015 07/11/2015 Electronic Signature(s) Signed: 07/18/2015 3:49:04 PM By: Loletha Grayer MD Entered By: Loletha Grayer on 07/18/2015 14:26:56 [name], [name] ([id number]) -------------------------------------------------------------------------------- Progress Note Details Patient Name: [name] Date of Service: 07/18/2015 1:30 PM Medical Record Patient Account Number: [id number] Number: Treating RN: Baruch Gouty, RN, BSN,  Rita [birth year] (81 y.o. Other Clinician: Date of Birth/Sex: Female) Treating BURNS III, Primary Care Physician: Lorane Gell Physician/Extender: Thayer Jew Referring Physician: Carmine Savoy in Treatment: 1 Subjective Chief Complaint Information obtained from Patient Right calf traumatic ulceration o2. Right hand skin tear (healed). History of Present Illness (HPI) Pleasant 81 year old with history of Arthritis;GERD (gastroesophageal reflux disease);Hypertension;Chronic kidney disease ;Vasculitis, ANCA positive, pulmonary hemorrhage;Thyroid disease;hyperthyroid, had radioactiveiodine treatment;Renal cell carcinoma 2003 s/p sgy for removal right kidney;Hemorrhoids. She has also had several surgeries including abdominal hysterectomy with bilateral salpingo-oophorectomy, hernia repair, fracture of her femur on the right side, cholecystectomy, cardiac catheterization. She traumatized her right calf at 2 locations in November 2016. More recently fell and suffered a skin tear to her right hand. Ambulating per her baseline. No significant pain. Right ABI 0.75. No fever or chills. Minimal drainage. Objective Constitutional Pulse regular. Respirations normal and unlabored. Afebrile. Vitals Time Taken: 1:18 PM, Height: 60 in, Weight: 128 lbs, BMI: 25, Temperature: 98.2 F, Pulse: 68 bpm, Respiratory Rate: 18 breaths/min, Blood Pressure: 107/53 mmHg. General Notes: Right lateral and medial calf ulcerations. Both full-thickness. No evidence for infection. Localized edema. Faintly palpable DP.  Right ABI 0.75. Right hand skin tear is reepithelialized. Below, [name] ([id number]) Integumentary (Hair, Skin) Wound #3 status is Open. Original cause of wound was Trauma. The wound is located on the Right,Medial Lower Leg. The wound measures 2.5cm length x 15cm width x 0.1cm depth; 29.452cm^2 area and 2.945cm^3 volume. The wound is limited to skin breakdown. There is no tunneling or undermining  noted. There is a medium amount of serosanguineous drainage noted. The wound margin is flat and intact. There is small (1-33%) pink, pale granulation within the wound bed. There is a medium (34-66%) amount of necrotic tissue within the wound bed including Eschar and Adherent Slough. The periwound skin appearance exhibited: Localized Edema, Moist. The periwound skin appearance did not exhibit: Callus, Crepitus, Excoriation, Fluctuance, Friable, Induration, Rash, Scarring, Dry/Scaly, Maceration, Atrophie Blanche, Cyanosis, Ecchymosis, Hemosiderin Staining, Mottled, Pallor, Rubor, Erythema. Periwound temperature was noted as No Abnormality. The periwound has tenderness on palpation. Wound #4 status is Open. Original cause of wound was Trauma. The wound is located on the Right,Lateral Lower Leg. The wound measures 2.1cm length x 2cm width x 0.1cm depth; 3.299cm^2 area and 0.33cm^3 volume. The wound is limited to skin breakdown. There is no tunneling or undermining noted. There is a small amount of serosanguineous drainage noted. The wound margin is flat and intact. There is small (1-33%) pink, pale granulation within the wound bed. There is a medium (34-66%) amount of necrotic tissue within the wound bed including Eschar. The periwound skin appearance exhibited: Localized Edema, Moist. The periwound skin appearance did not exhibit: Callus, Crepitus, Excoriation, Fluctuance, Friable, Induration, Rash, Scarring, Dry/Scaly, Maceration, Atrophie Blanche, Cyanosis, Ecchymosis, Hemosiderin Staining, Mottled, Pallor, Rubor, Erythema. Periwound temperature was noted as No Abnormality. The periwound has tenderness on palpation. Wound #5 status is Healed - Epithelialized. Original cause of wound was Trauma. The wound is located on the Circumferential Hand - Web Between 2nd and 3rd Digit. The wound measures 0cm length x 0cm width x 0cm depth; 0cm^2 area and 0cm^3 volume. Assessment Active  Problems ICD-10 L97.212 - Non-pressure chronic ulcer of right calf with fat layer exposed R60.0 - Localized edema Right calf traumatic ulcerations. Healed finger ulceration. Procedures Wound #4 [name], Suzanne W. ([id number]) Wound #4 is a Trauma, Other located on the Right,Lateral Lower Leg . There was a Skin/Subcutaneous Tissue Debridement [id number]) debridement with total area of 4.2 sq cm performed by BURNS III, Teressa Senter., MD. with the following instrument(s): Curette to remove Viable and Non-Viable tissue/material including Fat, Fibrin/Slough, and Subcutaneous after achieving pain control using Lidocaine 4% Topical Solution. A time out was conducted prior to the start of the procedure. A Minimum amount of bleeding was controlled with Pressure. The procedure was tolerated well with a pain level of 0 throughout and a pain level of 0 following the procedure. Post Debridement Measurements: 2.1cm length x 2cm width x 0.2cm depth; 0.66cm^3 volume. Post procedure Diagnosis Wound #4: Same as Pre-Procedure Plan Wound Cleansing: Wound #3 Right,Medial Lower Leg: Clean wound with Normal Saline. Wound #4 Right,Lateral Lower Leg: Clean wound with Normal Saline. Anesthetic: Wound #3 Right,Medial Lower Leg: Topical Lidocaine 4% cream applied to wound bed prior to debridement Primary Wound Dressing: Wound #3 Right,Medial Lower Leg: Aquacel Ag Wound #4 Right,Lateral Lower Leg: Aquacel Ag Secondary Dressing: Wound #3 Right,Medial Lower Leg: Boardered Foam Dressing Wound #4 Right,Lateral Lower Leg: Boardered Foam Dressing Dressing Change Frequency: Wound #3 Right,Medial Lower Leg: Change dressing every other day. Wound #4 Right,Lateral Lower Leg: Change dressing every other day.  Follow-up Appointments: Wound #3 Right,Medial Lower Leg: Return Appointment in 1 week. Wound #4 Right,Lateral Lower Leg: Return Appointment in 1 week. Edema Control: Wound #3 Right,Medial Lower  Leg: Tubigrip Wound #4 Right,Lateral Lower Leg: Tubigrip [name], Desaree W. ([id number]) continue with silver alginate and Tubigrip to right leg. Electronic Signature(s) Signed: 07/18/2015 3:49:04 PM By: Loletha Grayer MD Entered By: Loletha Grayer on 07/18/2015 14:30:06 Reise, [name] ([id number]) -------------------------------------------------------------------------------- SuperBill Details Patient Name: [name] Date of Service: 07/18/2015 Medical Record Patient Account Number: [id number] Number: Treating RN: Baruch Gouty, RN, BSN, Rita [birth year] [phone] y.o. Other Clinician: Date of Birth/Sex: Female) Treating BURNS III, Primary Care Physician: Lorane Gell Physician/Extender: Thayer Jew Referring Physician: Carmine Savoy in Treatment: 1 Diagnosis Coding ICD-10 Codes Code Description [phone] Non-pressure chronic ulcer of right calf with fat layer exposed R60.0 Localized edema Facility Procedures CPT4 Code: [id number] Description: [id number] - DEB SUBQ TISSUE 20 SQ CM/< ICD-10 Description Diagnosis L97.212 Non-pressure chronic ulcer of right calf with fat Modifier: layer exposed Quantity: 1 Physician Procedures CPT4 Code: [id number] Description: [id number] - WC PHYS SUBQ TISS 20 SQ CM ICD-10 Description Diagnosis L97.212 Non-pressure chronic ulcer of right calf with fat Modifier: layer exposed Quantity: 1 Electronic Signature(s) Signed: 07/18/2015 3:49:04 PM By: Loletha Grayer MD Entered By: Loletha Grayer on 07/18/2015 14:30:17

## 2015-07-19 NOTE — Progress Notes (Signed)
Suzanne, Kelly ([id number]) Visit Report for 07/18/2015 Arrival Information Details Patient Name: [name], [name]. Date of Service: 07/18/2015 1:30 PM Medical Record Number: [id number] Patient Account Number: [id number] Date of Birth/Sex: [birth year] (81 y.o. Female) Treating RN: Afful, RN, BSN, Velva Harman Primary Care Physician: Lorane Gell Other Clinician: Referring Physician: Lorane Gell Treating Physician/Extender: BURNS III, WALTER Weeks in Treatment: 1 Visit Information History Since Last Visit Added or deleted any medications: No Patient Arrived: Ambulatory Any new allergies or adverse reactions: No Arrival Time: 13:14 Had a fall or experienced change in No Accompanied By: self activities of daily living that may affect Transfer Assistance: None risk of falls: Patient Identification Verified: Yes Signs or symptoms of abuse/neglect since last No Secondary Verification Process Yes visito Completed: Hospitalized since last visit: No Patient Requires Transmission-Based No Has Dressing in Place as Prescribed: Yes Precautions: Pain Present Now: No Patient Has Alerts: No Electronic Signature(s) Signed: 07/18/2015 5:16:39 PM By: Regan Lemming BSN, RN Entered By: Regan Lemming on 07/18/2015 13:14:58 Mallis, [name] ([id number]) -------------------------------------------------------------------------------- Encounter Discharge Information Details Patient Name: [name] Date of Service: 07/18/2015 1:30 PM Medical Record Number: [id number] Patient Account Number: [id number] Date of Birth/Sex: 12-Sep-1928 (81 y.o. Female) Treating RN: Afful, RN, BSN, Velva Harman Primary Care Physician: Lorane Gell Other Clinician: Referring Physician: Lorane Gell Treating Physician/Extender: BURNS III, Charlean Sanfilippo in Treatment: 1 Encounter Discharge Information Items Discharge Pain Level: 0 Discharge Condition: Stable Ambulatory Status: Ambulatory Discharge Destination:  Home Transportation: Private Auto Accompanied By: self Schedule Follow-up Appointment: No Medication Reconciliation completed and provided to Patient/Care No [name]: Provided on Clinical Summary of Care: 07/18/2015 Form Type Recipient Paper Patient NB Electronic Signature(s) Signed: 07/18/2015 1:41:14 PM By: Ruthine Dose Entered By: Ruthine Dose on 07/18/2015 13:41:13 Pippen, [name] ([id number]) -------------------------------------------------------------------------------- Lower Extremity Assessment Details Patient Name: [name] Date of Service: 07/18/2015 1:30 PM Medical Record Number: [id number] Patient Account Number: [id number] Date of Birth/Sex: [birth year] (81 y.o. Female) Treating RN: Afful, RN, BSN, Rawson Primary Care Physician: Lorane Gell Other Clinician: Referring Physician: Lorane Gell Treating Physician/Extender: BURNS III, WALTER Weeks in Treatment: 1 Edema Assessment Assessed: [Left: No] [Right: No] Edema: [Left: Ye] [Right: s] Calf Left: Right: Point of Measurement: 32 cm From Medial Instep cm 33.5 cm Ankle Left: Right: Point of Measurement: 12 cm From Medial Instep cm 22.2 cm Vascular Assessment Claudication: Claudication Assessment [Right:None] Pulses: Posterior Tibial Dorsalis Pedis Palpable: [Right:Yes] Extremity colors, hair growth, and conditions: Extremity Color: [Right:Mottled] Hair Growth on Extremity: [Right:Yes] Temperature of Extremity: [Right:Warm] Capillary Refill: [Right:< 3 seconds] Toe Nail Assessment Left: Right: Thick: Yes Discolored: No Deformed: No Improper Length and Hygiene: No Electronic Signature(s) Signed: 07/18/2015 5:16:39 PM By: Regan Lemming BSN, RN Entered By: Regan Lemming on 07/18/2015 13:17:50 Scaglione, [name] ([id number]) Volkman, [name] ([id number]) -------------------------------------------------------------------------------- Multi Wound Chart Details Patient Name: [name] Date  of Service: 07/18/2015 1:30 PM Medical Record Number: [id number] Patient Account Number: [id number] Date of Birth/Sex: 05/27/1929 (81 y.o. Female) Treating RN: Afful, RN, BSN, Velva Harman Primary Care Physician: Lorane Gell Other Clinician: Referring Physician: Lorane Gell Treating Physician/Extender: BURNS III, WALTER Weeks in Treatment: 1 Vital Signs Height(in): 60 Pulse(bpm): 68 Weight(lbs): 128 Blood Pressure 107/53 (mmHg): Body Mass Index(BMI): 25 Temperature(F): 98.2 Respiratory Rate 18 (breaths/min): Photos: [3:No Photos] [4:No Photos] [5:No Photos] Wound Location: [3:Right Lower Leg - Medial Right Lower Leg - Lateral Circumferential Hand -] [5:Web Between 2nd and 3rd Digit] Wounding Event: [3:Trauma] [4:Trauma] [5:Trauma] Primary Etiology: [3:Trauma, Other] [4:Trauma, Other] [5:Trauma, Other]  Comorbid History: [3:Cataracts, Chronic sinus Cataracts, Chronic sinus N/A problems/congestion, Angina, Hypertension, Vasculitis, Osteoarthritis Vasculitis, Osteoarthritis] [4:problems/congestion, Angina, Hypertension,] Date Acquired: [3:07/04/2015] [4:06/19/2015] [5:07/11/2015] Weeks of Treatment: [3:1] [4:1] [5:1] Wound Status: [3:Open] [4:Open] [5:Healed - Epithelialized] Measurements L x W x D 2.5x15x0.1 [4:2.1x2x0.1] [5:0x0x0] (cm) Area (cm) : [3:29.452] [4:3.299] [5:0] Volume (cm) : [3:2.945] [4:0.33] [5:0] % Reduction in Area: [3:-941.80%] [4:17.60%] [5:100.00%] % Reduction in Volume: -421.20% [4:17.70%] [5:100.00%] Classification: [3:Full Thickness Without Exposed Support Structures] [4:Partial Thickness] [5:Partial Thickness] Exudate Amount: [3:Medium] [4:Small] [5:N/A] Exudate Type: [3:Serosanguineous] [4:Serosanguineous] [5:N/A] Exudate Color: [3:red, brown] [4:red, brown] [5:N/A] Wound Margin: [3:Flat and Intact] [4:Flat and Intact] [5:N/A] Granulation Amount: [3:Small (1-33%)] [4:Small (1-33%)] [5:N/A] Granulation Quality: [3:Pink, Pale] [4:Pink, Pale] [5:N/A] Necrotic  Amount: [3:Medium (34-66%)] [4:Medium (34-66%)] [5:N/A] Necrotic Tissue: [3:Eschar, Adherent Slough Eschar] [5:N/A] Exposed Structures: Fascia: No Fascia: No N/A Fat: No Fat: No Tendon: No Tendon: No Muscle: No Muscle: No Joint: No Joint: No Bone: No Bone: No Limited to Skin Limited to Skin Breakdown Breakdown Epithelialization: None None N/A Periwound Skin Texture: Edema: Yes Edema: Yes No Abnormalities Noted Excoriation: No Excoriation: No Induration: No Induration: No Callus: No Callus: No Crepitus: No Crepitus: No Fluctuance: No Fluctuance: No Friable: No Friable: No Rash: No Rash: No Scarring: No Scarring: No Periwound Skin Moist: Yes Moist: Yes No Abnormalities Noted Moisture: Maceration: No Maceration: No Dry/Scaly: No Dry/Scaly: No Periwound Skin Color: Atrophie Blanche: No Atrophie Blanche: No No Abnormalities Noted Cyanosis: No Cyanosis: No Ecchymosis: No Ecchymosis: No Erythema: No Erythema: No Hemosiderin Staining: No Hemosiderin Staining: No Mottled: No Mottled: No Pallor: No Pallor: No Rubor: No Rubor: No Temperature: No Abnormality No Abnormality N/A Tenderness on Yes Yes No Palpation: Wound Preparation: Ulcer Cleansing: Ulcer Cleansing: N/A Rinsed/Irrigated with Rinsed/Irrigated with Saline Saline Topical Anesthetic Topical Anesthetic Applied: Other: lidocaine Applied: Other: lidocaine 4% 4% Treatment Notes Electronic Signature(s) Signed: 07/18/2015 1:24:27 PM By: Regan Lemming BSN, RN Entered By: Regan Lemming on 07/18/2015 13:24:27 Jaber, [name] ([id number]) -------------------------------------------------------------------------------- [place] Details Patient Name: [name] Date of Service: 07/18/2015 1:30 PM Medical Record Number: [id number] Patient Account Number: [id number] Date of Birth/Sex: July 09, 1929 (81 y.o. Female) Treating RN: Afful, RN, BSN, Velva Harman Primary Care Physician: Lorane Gell Other Clinician: Referring Physician: Lorane Gell Treating Physician/Extender: BURNS III, WALTER Weeks in Treatment: 1 Active Inactive Orientation to the Wound Care Program Nursing Diagnoses: Knowledge deficit related to the wound healing center program Goals: Patient/caregiver will verbalize understanding of the [facility] Program Date Initiated: 07/11/2015 Goal Status: Active Interventions: Provide education on orientation to the wound center Notes: Soft Tissue Infection Nursing Diagnoses: Impaired tissue integrity Potential for infection: soft tissue Goals: Patient will remain free of wound infection Date Initiated: 07/11/2015 Goal Status: Active Interventions: Assess signs and symptoms of infection every visit Notes: Wound/Skin Impairment Nursing Diagnoses: Impaired tissue integrity Goals: Ulcer/skin breakdown will heal within 14 weeks LANAI, LEFFERS ([id number]) Date Initiated: 07/11/2015 Goal Status: Active Interventions: Assess patient/caregiver ability to obtain necessary supplies Assess ulceration(s) every visit Notes: Electronic Signature(s) Signed: 07/18/2015 1:24:17 PM By: Regan Lemming BSN, RN Entered By: Regan Lemming on 07/18/2015 13:24:17 Menard, [name] ([id number]) -------------------------------------------------------------------------------- Pain Assessment Details Patient Name: [name] Date of Service: 07/18/2015 1:30 PM Medical Record Number: [id number] Patient Account Number: [id number] Date of Birth/Sex: 1928-12-03 (81 y.o. Female) Treating RN: Baruch Gouty, RN, BSN, Velva Harman Primary Care Physician: Lorane Gell Other Clinician: Referring Physician: Lorane Gell Treating Physician/Extender: BURNS III, WALTER Weeks in Treatment: 1 Active Problems  Location of Pain Severity and Description of Pain Patient Has Paino No Site Locations Pain Management and Medication Current Pain Management: Electronic Signature(s) Signed:  07/18/2015 5:16:39 PM By: Regan Lemming BSN, RN Entered By: Regan Lemming on 07/18/2015 13:15:16 Johannsen, [name] ([id number]) -------------------------------------------------------------------------------- Patient/Caregiver Education Details Patient Name: [name] Date of Service: 07/18/2015 1:30 PM Medical Record Number: [id number] Patient Account Number: [id number] Date of Birth/Gender: August 04, 1929 (81 y.o. Female) Treating RN: Baruch Gouty, RN, BSN, Velva Harman Primary Care Physician: Lorane Gell Other Clinician: Referring Physician: Lorane Gell Treating Physician/Extender: BURNS III, Charlean Sanfilippo in Treatment: 1 Education Assessment Education Provided To: Patient Education Topics Provided Welcome To [facility]: Methods: Explain/Verbal Responses: State content correctly Electronic Signature(s) Signed: 07/18/2015 5:16:39 PM By: Regan Lemming BSN, RN Entered By: Regan Lemming on 07/18/2015 13:39:49 Bry, [name] ([id number]) -------------------------------------------------------------------------------- Wound Assessment Details Patient Name: [name] Date of Service: 07/18/2015 1:30 PM Medical Record Number: [id number] Patient Account Number: [id number] Date of Birth/Sex: 19-Jan-1929 (81 y.o. Female) Treating RN: Afful, RN, BSN, Velva Harman Primary Care Physician: Lorane Gell Other Clinician: Referring Physician: Lorane Gell Treating Physician/Extender: BURNS III, WALTER Weeks in Treatment: 1 Wound Status Wound Number: 3 Primary Trauma, Other Etiology: Wound Location: Right Lower Leg - Medial Wound Open Wounding Event: Trauma Status: Date Acquired: 07/04/2015 Comorbid Cataracts, Chronic sinus Weeks Of Treatment: 1 History: problems/congestion, Angina, Clustered Wound: No Hypertension, Vasculitis, Osteoarthritis Photos Photo Uploaded By: Regan Lemming on 07/18/2015 16:14:17 Wound Measurements Length: (cm) 2.5 Width: (cm) 15 Depth: (cm) 0.1 Area: (cm)  29.452 Volume: (cm) 2.945 % Reduction in Area: -941.8% % Reduction in Volume: -421.2% Epithelialization: None Tunneling: No Undermining: No Wound Description Full Thickness Without Exposed Classification: Support Structures Wound Margin: Flat and Intact Exudate Medium Amount: Exudate Type: Serosanguineous Exudate Color: red, brown Foul Odor After Cleansing: No Wound Bed Granulation Amount: Small (1-33%) Exposed Structure Granulation Quality: Pink, Pale Fascia Exposed: No Garin, Esma W. ([id number]) Necrotic Amount: Medium (34-66%) Fat Layer Exposed: No Necrotic Quality: Eschar, Adherent Slough Tendon Exposed: No Muscle Exposed: No Joint Exposed: No Bone Exposed: No Limited to Skin Breakdown Periwound Skin Texture Texture Color No Abnormalities Noted: No No Abnormalities Noted: No Callus: No Atrophie Blanche: No Crepitus: No Cyanosis: No Excoriation: No Ecchymosis: No Fluctuance: No Erythema: No Friable: No Hemosiderin Staining: No Induration: No Mottled: No Localized Edema: Yes Pallor: No Rash: No Rubor: No Scarring: No Temperature / Pain Moisture Temperature: No Abnormality No Abnormalities Noted: No Tenderness on Palpation: Yes Dry / Scaly: No Maceration: No Moist: Yes Wound Preparation Ulcer Cleansing: Rinsed/Irrigated with Saline Topical Anesthetic Applied: Other: lidocaine 4%, Treatment Notes Wound #3 (Right, Medial Lower Leg) 1. Cleansed with: Clean wound with Normal Saline 3. Peri-wound Care: Moisturizing lotion 4. Dressing Applied: Aquacel Ag 5. Secondary Dressing Applied Bordered Foam Dressing 6. Footwear/Offloading device applied Tubigrip 7. Secured with Financial risk analyst) Signed: 07/18/2015 1:23:37 PM By: Regan Lemming BSN, RN [place], [place] ([id number]) Entered By: Regan Lemming on 07/18/2015 13:23:37 Mellinger, [name]  ([id number]) -------------------------------------------------------------------------------- Wound Assessment Details Patient Name: [name] Date of Service: 07/18/2015 1:30 PM Medical Record Number: [id number] Patient Account Number: [id number] Date of Birth/Sex: 12/03/1928 (81 y.o. Female) Treating RN: Afful, RN, BSN, Velva Harman Primary Care Physician: Lorane Gell Other Clinician: Referring Physician: Lorane Gell Treating Physician/Extender: BURNS III, WALTER Weeks in Treatment: 1 Wound Status Wound Number: 4 Primary Trauma, Other Etiology: Wound Location: Right Lower Leg - Lateral Wound Open Wounding Event: Trauma Status: Date Acquired: 06/19/2015 Comorbid Cataracts, Chronic  sinus Weeks Of Treatment: 1 History: problems/congestion, Angina, Clustered Wound: No Hypertension, Vasculitis, Osteoarthritis Photos Photo Uploaded By: Regan Lemming on 07/18/2015 16:16:29 Wound Measurements Length: (cm) 2.1 Width: (cm) 2 Depth: (cm) 0.1 Area: (cm) 3.299 Volume: (cm) 0.33 % Reduction in Area: 17.6% % Reduction in Volume: 17.7% Epithelialization: None Tunneling: No Undermining: No Wound Description Classification: Partial Thickness Wound Margin: Flat and Intact Exudate Amount: Small Exudate Type: Serosanguineous Exudate Color: red, brown Foul Odor After Cleansing: No Wound Bed Granulation Amount: Small (1-33%) Exposed Structure Granulation Quality: Pink, Pale Fascia Exposed: No Necrotic Amount: Medium (34-66%) Fat Layer Exposed: No Necrotic Quality: Eschar Tendon Exposed: No Pinkhasov, Essance W. ([id number]) Muscle Exposed: No Joint Exposed: No Bone Exposed: No Limited to Skin Breakdown Periwound Skin Texture Texture Color No Abnormalities Noted: No No Abnormalities Noted: No Callus: No Atrophie Blanche: No Crepitus: No Cyanosis: No Excoriation: No Ecchymosis: No Fluctuance: No Erythema: No Friable: No Hemosiderin Staining: No Induration: No Mottled:  No Localized Edema: Yes Pallor: No Rash: No Rubor: No Scarring: No Temperature / Pain Moisture Temperature: No Abnormality No Abnormalities Noted: No Tenderness on Palpation: Yes Dry / Scaly: No Maceration: No Moist: Yes Wound Preparation Ulcer Cleansing: Rinsed/Irrigated with Saline Topical Anesthetic Applied: Other: lidocaine 4%, Treatment Notes Wound #4 (Right, Lateral Lower Leg) 1. Cleansed with: Clean wound with Normal Saline 3. Peri-wound Care: Moisturizing lotion 4. Dressing Applied: Aquacel Ag 5. Secondary Dressing Applied Bordered Foam Dressing 6. Footwear/Offloading device applied Tubigrip 7. Secured with Financial risk analyst) Signed: 07/18/2015 1:24:07 PM By: Regan Lemming BSN, RN Entered By: Regan Lemming on 07/18/2015 13:24:06 Embry, [name] ([id number]) Ryback, [place] ([id number]) -------------------------------------------------------------------------------- Wound Assessment Details Patient Name: [name] Date of Service: 07/18/2015 1:30 PM Medical Record Number: [id number] Patient Account Number: [id number] Date of Birth/Sex: 1929/03/16 (81 y.o. Female) Treating RN: Afful, RN, BSN, Velva Harman Primary Care Physician: Lorane Gell Other Clinician: Referring Physician: Lorane Gell Treating Physician/Extender: BURNS III, WALTER Weeks in Treatment: 1 Wound Status Wound Number: 5 Primary Etiology: Trauma, Other Wound Location: Circumferential Hand - Web Wound Status: Healed - Epithelialized Between 2nd and 3rd Digit Wounding Event: Trauma Date Acquired: 07/11/2015 Weeks Of Treatment: 1 Clustered Wound: No Photos Photo Uploaded By: Regan Lemming on 07/18/2015 16:16:29 Wound Measurements Length: (cm) 0 Width: (cm) 0 Depth: (cm) 0 Area: (cm) 0 Volume: (cm) 0 % Reduction in Area: 100% % Reduction in Volume: 100% Wound Description Classification: Partial Thickness Periwound Skin Texture Texture Color No Abnormalities Noted:  No No Abnormalities Noted: No Moisture No Abnormalities Noted: No Electronic Signature(s) Signed: 07/18/2015 5:16:39 PM By: Regan Lemming BSN, RN [place], [place] ([id number]) Entered By: Regan Lemming on 07/18/2015 13:21:00 Ploch, [name] ([id number]) -------------------------------------------------------------------------------- Vitals Details Patient Name: [name] Date of Service: 07/18/2015 1:30 PM Medical Record Number: [id number] Patient Account Number: [id number] Date of Birth/Sex: Jun 29, 1929 (81 y.o. Female) Treating RN: Afful, RN, BSN, Velva Harman Primary Care Physician: Lorane Gell Other Clinician: Referring Physician: Lorane Gell Treating Physician/Extender: BURNS III, WALTER Weeks in Treatment: 1 Vital Signs Time Taken: 13:18 Temperature (F): 98.2 Height (in): 60 Pulse (bpm): 68 Weight (lbs): 128 Respiratory Rate (breaths/min): 18 Body Mass Index (BMI): 25 Blood Pressure (mmHg): 107/53 Reference Range: 80 - 120 mg / dl Electronic Signature(s) Signed: 07/18/2015 5:16:39 PM By: Regan Lemming BSN, RN Entered By: Regan Lemming on 07/18/2015 13:19:01

## 2015-07-25 ENCOUNTER — Encounter: Payer: Medicare Other | Admitting: Surgery

## 2015-07-25 DIAGNOSIS — L97212 Non-pressure chronic ulcer of right calf with fat layer exposed: Secondary | ICD-10-CM | POA: Diagnosis not present

## 2015-07-26 NOTE — Progress Notes (Signed)
[name], [name] ([id number]) Visit Report for 07/25/2015 Arrival Information Details Patient Name: [name], [name] Date of Service: 07/25/2015 12:45 PM Medical Record Number: [id number] Patient Account Number: [id number] Date of Birth/Sex: [birth year] (81 y.o. Female) Treating RN: Carolyne Fiscal, Debi Primary Care Physician: Lorane Gell Other Clinician: Referring Physician: Lorane Gell Treating Physician/Extender: BURNS III, WALTER Weeks in Treatment: 2 Visit Information History Since Last Visit All ordered tests and consults were completed: No Patient Arrived: Ambulatory Added or deleted any medications: No Arrival Time: 12:51 Any new allergies or adverse reactions: No Accompanied By: self Had a fall or experienced change in No Transfer Assistance: None activities of daily living that may affect Patient Identification Verified: Yes risk of falls: Secondary Verification Process Yes Signs or symptoms of abuse/neglect since last No Completed: visito Patient Requires Transmission-Based No Hospitalized since last visit: No Precautions: Pain Present Now: Yes Patient Has Alerts: No Electronic Signature(s) Signed: 07/25/2015 4:19:08 PM By: Alric Quan Entered By: Alric Quan on 07/25/2015 12:51:33 Kraker, [name] ([id number]) -------------------------------------------------------------------------------- Encounter Discharge Information Details Patient Name: [name] Date of Service: 07/25/2015 12:45 PM Medical Record Number: [id number] Patient Account Number: [id number] Date of Birth/Sex: January 10, 1929 (81 y.o. Female) Treating RN: Carolyne Fiscal, Debi Primary Care Physician: Lorane Gell Other Clinician: Referring Physician: Lorane Gell Treating Physician/Extender: BURNS III, WALTER Weeks in Treatment: 2 Encounter Discharge Information Items Discharge Pain Level: 0 Discharge Condition: Stable Ambulatory Status: Ambulatory Discharge Destination:  Home Transportation: Private Auto Accompanied By: self Schedule Follow-up Appointment: Yes Medication Reconciliation completed and provided to Patient/Care Yes Shaneisha Burkel: Provided on Clinical Summary of Care: 07/25/2015 Form Type Recipient Paper Patient NB Electronic Signature(s) Signed: 07/25/2015 1:35:39 PM By: Ruthine Dose Entered By: Ruthine Dose on 07/25/2015 13:35:38 Lorenzi, [name] ([id number]) -------------------------------------------------------------------------------- Lower Extremity Assessment Details Patient Name: [name] Date of Service: 07/25/2015 12:45 PM Medical Record Number: [id number] Patient Account Number: [id number] Date of Birth/Sex: 11-Jun-1929 (81 y.o. Female) Treating RN: Carolyne Fiscal, Debi Primary Care Physician: Lorane Gell Other Clinician: Referring Physician: Lorane Gell Treating Physician/Extender: BURNS III, WALTER Weeks in Treatment: 2 Edema Assessment Assessed: [Left: No] [Right: No] Edema: [Left: Ye] [Right: s] Calf Left: Right: Point of Measurement: cm From Medial Instep cm 35 cm Ankle Left: Right: Point of Measurement: cm From Medial Instep cm 21.5 cm Vascular Assessment Pulses: Posterior Tibial Dorsalis Pedis Palpable: [Right:Yes] Extremity colors, hair growth, and conditions: Extremity Color: [Right:Hyperpigmented] Hair Growth on Extremity: [Right:Yes] Temperature of Extremity: [Right:Warm] Capillary Refill: [Right:< 3 seconds] Toe Nail Assessment Left: Right: Thick: No Discolored: No Deformed: No Improper Length and Hygiene: No Electronic Signature(s) Signed: 07/25/2015 4:19:08 PM By: Alric Quan Entered By: Alric Quan on 07/25/2015 12:59:47 Tagliaferri, [name] ([id number]) -------------------------------------------------------------------------------- Multi Wound Chart Details Patient Name: [name] Date of Service: 07/25/2015 12:45 PM Medical Record Number: [id number] Patient Account  Number: [id number] Date of Birth/Sex: [birth year] (81 y.o. Female) Treating RN: Carolyne Fiscal, Debi Primary Care Physician: Lorane Gell Other Clinician: Referring Physician: Lorane Gell Treating Physician/Extender: BURNS III, WALTER Weeks in Treatment: 2 Vital Signs Height(in): 60 Pulse(bpm): 72 Weight(lbs): 128 Blood Pressure 120/60 (mmHg): Body Mass Index(BMI): 25 Temperature(F): 98.2 Respiratory Rate 18 (breaths/min): Photos: [3:No Photos] [4:No Photos] [N/A:N/A] Wound Location: [3:Right Lower Leg - Medial] [4:Right Lower Leg - Lateral] [N/A:N/A] Wounding Event: [3:Trauma] [4:Trauma] [N/A:N/A] Primary Etiology: [3:Trauma, Other] [4:Trauma, Other] [N/A:N/A] Comorbid History: [3:Cataracts, Chronic sinus problems/congestion, Angina, Hypertension, Vasculitis, Osteoarthritis] [4:Cataracts, Chronic sinus problems/congestion, Angina, Hypertension, Vasculitis, Osteoarthritis] [N/A:N/A] Date Acquired: [3:07/04/2015] [4:06/19/2015] [N/A:N/A] Weeks of Treatment: [3:2] [4:2] [N/A:N/A] Wound  Status: [3:Open] [4:Open] [N/A:N/A] Measurements L x W x D 2.6x1.6x0.1 [4:1.6x0.5x0.5] [N/A:N/A] (cm) Area (cm) : [3:3.267] [4:0.628] [N/A:N/A] Volume (cm) : [3:0.327] [4:0.314] [N/A:N/A] % Reduction in Area: [3:-15.60%] [4:84.30%] [N/A:N/A] % Reduction in Volume: 42.10% [4:21.70%] [N/A:N/A] Classification: [3:Full Thickness Without Exposed Support Structures] [4:Partial Thickness] [N/A:N/A] Exudate Amount: [3:Medium] [4:None Present] [N/A:N/A] Exudate Type: [3:Serosanguineous] [4:N/A] [N/A:N/A] Exudate Color: [3:red, brown] [4:N/A] [N/A:N/A] Wound Margin: [3:Flat and Intact] [4:Flat and Intact] [N/A:N/A] Granulation Amount: [3:Small (1-33%)] [4:None Present (0%)] [N/A:N/A] Granulation Quality: [3:Pink, Pale] [4:N/A] [N/A:N/A] Necrotic Amount: [3:Medium (34-66%)] [4:Large (67-100%)] [N/A:N/A] Necrotic Tissue: [3:Eschar, Adherent Slough] [4:Eschar] [N/A:N/A] Exposed Structures: [3:Fascia: No Fat:  No] [4:Fascia: No Fat: No] [N/A:N/A] Tendon: No Tendon: No Muscle: No Muscle: No Joint: No Joint: No Bone: No Bone: No Limited to Skin Limited to Skin Breakdown Breakdown Epithelialization: None None N/A Periwound Skin Texture: Edema: Yes Edema: Yes N/A Excoriation: No Excoriation: No Induration: No Induration: No Callus: No Callus: No Crepitus: No Crepitus: No Fluctuance: No Fluctuance: No Friable: No Friable: No Rash: No Rash: No Scarring: No Scarring: No Periwound Skin Moist: Yes Maceration: No N/A Moisture: Maceration: No Moist: No Dry/Scaly: No Dry/Scaly: No Periwound Skin Color: Atrophie Blanche: No Atrophie Blanche: No N/A Cyanosis: No Cyanosis: No Ecchymosis: No Ecchymosis: No Erythema: No Erythema: No Hemosiderin Staining: No Hemosiderin Staining: No Mottled: No Mottled: No Pallor: No Pallor: No Rubor: No Rubor: No Temperature: No Abnormality No Abnormality N/A Tenderness on Yes Yes N/A Palpation: Wound Preparation: Ulcer Cleansing: Ulcer Cleansing: N/A Rinsed/Irrigated with Rinsed/Irrigated with Saline Saline Topical Anesthetic Topical Anesthetic Applied: Other: lidocaine Applied: Other: lidocaine 4% 4% Treatment Notes Electronic Signature(s) Signed: 07/25/2015 4:19:08 PM By: Alric Quan Entered By: Alric Quan on 07/25/2015 13:04:16 Marban, [name] ([id number]) -------------------------------------------------------------------------------- [place] Details Patient Name: [name] Date of Service: 07/25/2015 12:45 PM Medical Record Number: [id number] Patient Account Number: [id number] Date of Birth/Sex: 1929/02/05 (81 y.o. Female) Treating RN: Carolyne Fiscal, Debi Primary Care Physician: Lorane Gell Other Clinician: Referring Physician: Lorane Gell Treating Physician/Extender: BURNS III, WALTER Weeks in Treatment: 2 Active Inactive Orientation to the Wound Care Program Nursing  Diagnoses: Knowledge deficit related to the wound healing center program Goals: Patient/caregiver will verbalize understanding of the [facility] Date Initiated: 07/11/2015 Goal Status: Active Interventions: Provide education on orientation to the wound center Notes: Soft Tissue Infection Nursing Diagnoses: Impaired tissue integrity Potential for infection: soft tissue Goals: Patient will remain free of wound infection Date Initiated: 07/11/2015 Goal Status: Active Interventions: Assess signs and symptoms of infection every visit Notes: Wound/Skin Impairment Nursing Diagnoses: Impaired tissue integrity Goals: Ulcer/skin breakdown will heal within 14 weeks SHONTAYE, OYAMA ([id number]) Date Initiated: 07/11/2015 Goal Status: Active Interventions: Assess patient/caregiver ability to obtain necessary supplies Assess ulceration(s) every visit Notes: Electronic Signature(s) Signed: 07/25/2015 4:19:08 PM By: Alric Quan Entered By: Alric Quan on 07/25/2015 13:04:09 Hacking, [name] ([id number]) -------------------------------------------------------------------------------- Pain Assessment Details Patient Name: [name] Date of Service: 07/25/2015 12:45 PM Medical Record Number: [id number] Patient Account Number: [id number] Date of Birth/Sex: 07-11-1929 (81 y.o. Female) Treating RN: Carolyne Fiscal, Debi Primary Care Physician: Lorane Gell Other Clinician: Referring Physician: Lorane Gell Treating Physician/Extender: BURNS III, WALTER Weeks in Treatment: 2 Active Problems Location of Pain Severity and Description of Pain Patient Has Paino Yes Site Locations Pain Location: Pain in Ulcers With Dressing Change: Yes Duration of the Pain. Constant / Intermittento Constant Rate the pain. Current Pain Level: 5 Character of Pain Describe the Pain: Aching Pain Management and  Medication Current Pain Management: Electronic  Signature(s) Signed: 07/25/2015 4:19:08 PM By: Alric Quan Entered By: Alric Quan on 07/25/2015 12:51:52 Janusz, [name] ([id number]) -------------------------------------------------------------------------------- Patient/Caregiver Education Details Patient Name: [name] Date of Service: 07/25/2015 12:45 PM Medical Record Number: [id number] Patient Account Number: [id number] Date of Birth/Gender: [birth year] (81 y.o. Female) Treating RN: Carolyne Fiscal, Debi Primary Care Physician: Lorane Gell Other Clinician: Referring Physician: Lorane Gell Treating Physician/Extender: BURNS III, Charlean Sanfilippo in Treatment: 2 Education Assessment Education Provided To: Patient Education Topics Provided Wound/Skin Impairment: Handouts: Other: change dressing as ordered Methods: Demonstration, Explain/Verbal Responses: State content correctly Electronic Signature(s) Signed: 07/25/2015 4:19:08 PM By: Alric Quan Entered By: Alric Quan on 07/25/2015 13:25:23 Izard, [name] ([id number]) -------------------------------------------------------------------------------- Wound Assessment Details Patient Name: [name] Date of Service: 07/25/2015 12:45 PM Medical Record Number: [id number] Patient Account Number: [id number] Date of Birth/Sex: 05-Apr-1929 (81 y.o. Female) Treating RN: Carolyne Fiscal, Debi Primary Care Physician: Lorane Gell Other Clinician: Referring Physician: Lorane Gell Treating Physician/Extender: BURNS III, WALTER Weeks in Treatment: 2 Wound Status Wound Number: 3 Primary Trauma, Other Etiology: Wound Location: Right Lower Leg - Medial Wound Open Wounding Event: Trauma Status: Date Acquired: 07/04/2015 Comorbid Cataracts, Chronic sinus Weeks Of Treatment: 2 History: problems/congestion, Angina, Clustered Wound: No Hypertension, Vasculitis, Osteoarthritis Photos Photo Uploaded By: Gretta Cool, RN, BSN, Kim on 07/25/2015 16:30:09 Wound  Measurements Length: (cm) 2.6 Width: (cm) 1.6 Depth: (cm) 0.1 Area: (cm) 3.267 Volume: (cm) 0.327 % Reduction in Area: -15.6% % Reduction in Volume: 42.1% Epithelialization: None Tunneling: No Undermining: No Wound Description Full Thickness Without Exposed Classification: Support Structures Wound Margin: Flat and Intact Exudate Medium Amount: Exudate Type: Serosanguineous Exudate Color: red, brown Foul Odor After Cleansing: No Wound Bed Granulation Amount: Small (1-33%) Exposed Structure Granulation Quality: Pink, Pale Fascia Exposed: No Hulick, Lovelle W. ([id number]) Necrotic Amount: Medium (34-66%) Fat Layer Exposed: No Necrotic Quality: Eschar, Adherent Slough Tendon Exposed: No Muscle Exposed: No Joint Exposed: No Bone Exposed: No Limited to Skin Breakdown Periwound Skin Texture Texture Color No Abnormalities Noted: No No Abnormalities Noted: No Callus: No Atrophie Blanche: No Crepitus: No Cyanosis: No Excoriation: No Ecchymosis: No Fluctuance: No Erythema: No Friable: No Hemosiderin Staining: No Induration: No Mottled: No Localized Edema: Yes Pallor: No Rash: No Rubor: No Scarring: No Temperature / Pain Moisture Temperature: No Abnormality No Abnormalities Noted: No Tenderness on Palpation: Yes Dry / Scaly: No Maceration: No Moist: Yes Wound Preparation Ulcer Cleansing: Rinsed/Irrigated with Saline Topical Anesthetic Applied: Other: lidocaine 4%, Treatment Notes Wound #3 (Right, Medial Lower Leg) 1. Cleansed with: Clean wound with Normal Saline 2. Anesthetic Topical Lidocaine 4% cream to wound bed prior to debridement 4. Dressing Applied: Prisma Ag 5. Secondary Dressing Applied Bordered Foam Dressing 7. Secured with Tubigrip Electronic Signature(s) Signed: 07/25/2015 4:19:08 PM By: Alric Quan Entered By: Alric Quan on 07/25/2015 13:02:24 Mcglown, [name] ([id number]) Desmith, [name]  ([id number]) -------------------------------------------------------------------------------- Wound Assessment Details Patient Name: [name] Date of Service: 07/25/2015 12:45 PM Medical Record Number: [id number] Patient Account Number: [id number] Date of Birth/Sex: 01/31/1929 (81 y.o. Female) Treating RN: Carolyne Fiscal, Debi Primary Care Physician: Lorane Gell Other Clinician: Referring Physician: Lorane Gell Treating Physician/Extender: BURNS III, WALTER Weeks in Treatment: 2 Wound Status Wound Number: 4 Primary Trauma, Other Etiology: Wound Location: Right, Lateral Lower Leg Wound Open Wounding Event: Trauma Status: Date Acquired: 06/19/2015 Comorbid Cataracts, Chronic sinus Weeks Of Treatment: 2 History: problems/congestion, Angina, Clustered Wound: No Hypertension, Vasculitis, Osteoarthritis Photos Photo Uploaded By: Gretta Cool RN, BSN, Kim on  07/25/2015 16:30:09 Wound Measurements Length: (cm) 1.6 Width: (cm) 0.5 Depth: (cm) 0.1 Area: (cm) 0.628 Volume: (cm) 0.063 % Reduction in Area: 84.3% % Reduction in Volume: 84.3% Epithelialization: None Tunneling: No Undermining: No Wound Description Classification: Partial Thickness Wound Margin: Flat and Intact Exudate Amount: None Present Foul Odor After Cleansing: No Wound Bed Granulation Amount: None Present (0%) Exposed Structure Necrotic Amount: Large (67-100%) Fascia Exposed: No Necrotic Quality: Eschar Fat Layer Exposed: No Tendon Exposed: No Muscle Exposed: No Joint Exposed: No Szabo, Ricardo W. ([id number]) Bone Exposed: No Limited to Skin Breakdown Periwound Skin Texture Texture Color No Abnormalities Noted: No No Abnormalities Noted: No Callus: No Atrophie Blanche: No Crepitus: No Cyanosis: No Excoriation: No Ecchymosis: No Fluctuance: No Erythema: No Friable: No Hemosiderin Staining: No Induration: No Mottled: No Localized Edema: Yes Pallor: No Rash: No Rubor: No Scarring: No  Temperature / Pain Moisture Temperature: No Abnormality No Abnormalities Noted: No Tenderness on Palpation: Yes Dry / Scaly: No Maceration: No Moist: No Wound Preparation Ulcer Cleansing: Rinsed/Irrigated with Saline Topical Anesthetic Applied: Other: lidocaine 4%, Treatment Notes Wound #4 (Right, Lateral Lower Leg) 1. Cleansed with: Clean wound with Normal Saline 2. Anesthetic Topical Lidocaine 4% cream to wound bed prior to debridement 4. Dressing Applied: Prisma Ag 5. Secondary Dressing Applied Bordered Foam Dressing 7. Secured with Tubigrip Electronic Signature(s) Signed: 07/25/2015 4:19:08 PM By: Alric Quan Entered By: Alric Quan on 07/25/2015 13:22:39 Bilyeu, [name] ([id number]) -------------------------------------------------------------------------------- Vitals Details Patient Name: [name] Date of Service: 07/25/2015 12:45 PM Medical Record Number: [id number] Patient Account Number: [id number] Date of Birth/Sex: [birth year] (81 y.o. Female) Treating RN: Carolyne Fiscal, Debi Primary Care Physician: Lorane Gell Other Clinician: Referring Physician: Lorane Gell Treating Physician/Extender: BURNS III, WALTER Weeks in Treatment: 2 Vital Signs Time Taken: 12:52 Temperature (F): 98.2 Height (in): 60 Pulse (bpm): 72 Weight (lbs): 128 Respiratory Rate (breaths/min): 18 Body Mass Index (BMI): 25 Blood Pressure (mmHg): 120/60 Reference Range: 80 - 120 mg / dl Electronic Signature(s) Signed: 07/25/2015 4:19:08 PM By: Alric Quan Entered By: Alric Quan on 07/25/2015 12:58:00

## 2015-07-26 NOTE — Progress Notes (Signed)
CORYNN, GORSKY ([id number]) Visit Report for 07/25/2015 Chief Complaint Document Details Patient Name: ALEDA, CARMENATE 07/25/2015 12:45 Date of Service: PM Medical Record [id number] Number: Patient Account Number: [id number] 12/20/1928 (81 y.o. Treating RN: Cornell Barman Date of Birth/Sex: Female) Other Clinician: Primary Care Physician: DOSS, CARRIE Treating BURNS III, Karmyn Lowman Referring Physician: Lorane Gell Physician/Extender: Weeks in Treatment: 2 Information Obtained from: Patient Chief Complaint Right calf traumatic ulceration o2. Right hand skin tear (healed). Electronic Signature(s) Signed: 07/25/2015 3:48:01 PM By: Loletha Grayer MD Entered By: Loletha Grayer on 07/25/2015 13:32:56 Dragan, [name] ([id number]) -------------------------------------------------------------------------------- Debridement Details Patient Name: YATZIRY, VILE 07/25/2015 12:45 Date of Service: PM Medical Record [id number] Number: Patient Account Number: [id number] Sep 17, 1928 (81 y.o. Treating RN: Cornell Barman Date of Birth/Sex: Female) Other Clinician: Primary Care Physician: DOSS, CARRIE Treating BURNS III, Dwana Garin Referring Physician: Lorane Gell Physician/Extender: Weeks in Treatment: 2 Debridement Performed for Wound #3 Right,Medial Lower Leg Assessment: Performed By: Physician BURNS III, Teressa Senter., MD Debridement: Debridement Pre-procedure Yes Verification/Time Out Taken: Start Time: 13:20 Pain Control: Other : lidocaine 4% cream Level: Skin/Subcutaneous Tissue Total Area Debrided (L x 2.6 (cm) x 1.6 (cm) = 4.16 (cm) W): Tissue and other Viable, Non-Viable, Exudate, Fat, Fibrin/Slough, Subcutaneous material debrided: Instrument: Curette Bleeding: Minimum Hemostasis Achieved: Pressure End Time: 13:24 Procedural Pain: 0 Post Procedural Pain: 0 Response to Treatment: Procedure was tolerated well Post Debridement Measurements of Total Wound Length: (cm)  2.6 Width: (cm) 1.6 Depth: (cm) 0.2 Volume: (cm) 0.653 Post Procedure Diagnosis Same as Pre-procedure Electronic Signature(s) Signed: 07/25/2015 3:48:01 PM By: Loletha Grayer MD Signed: 07/25/2015 4:52:55 PM By: Gretta Cool, RN, BSN, Kim RN, BSN Entered By: Loletha Grayer on 07/25/2015 13:32:25 Brian, [name] ([id number]) -------------------------------------------------------------------------------- Debridement Details Patient Name: MARIAM, SCHMEISER. 07/25/2015 12:45 Date of Service: PM Medical Record [id number] Number: Patient Account Number: [id number] 05-09-1929 (81 y.o. Treating RN: Cornell Barman Date of Birth/Sex: Female) Other Clinician: Primary Care Physician: DOSS, CARRIE Treating BURNS III, Jonathandavid Marlett Referring Physician: Lorane Gell Physician/Extender: Weeks in Treatment: 2 Debridement Performed for Wound #4 Right,Lateral Lower Leg Assessment: Performed By: Physician BURNS III, Teressa Senter., MD Debridement: Debridement Pre-procedure Yes Verification/Time Out Taken: Start Time: 13:20 Pain Control: Other : lidocaine 4% cream Level: Skin/Subcutaneous Tissue Total Area Debrided (L x 1.6 (cm) x 0.5 (cm) = 0.8 (cm) W): Tissue and other Viable, Non-Viable, Exudate, Fat, Fibrin/Slough, Subcutaneous material debrided: Instrument: Curette Bleeding: Minimum Hemostasis Achieved: Pressure End Time: 13:24 Procedural Pain: 0 Post Procedural Pain: 0 Response to Treatment: Procedure was tolerated well Post Debridement Measurements of Total Wound Length: (cm) 1.6 Width: (cm) 0.5 Depth: (cm) 0.2 Volume: (cm) 0.126 Post Procedure Diagnosis Same as Pre-procedure Electronic Signature(s) Signed: 07/25/2015 3:48:01 PM By: Loletha Grayer MD Signed: 07/25/2015 4:52:55 PM By: Gretta Cool, RN, BSN, Kim RN, BSN Entered By: Loletha Grayer on 07/25/2015 13:32:45 [place]ington, [name]  ([id number]) -------------------------------------------------------------------------------- HPI Details Patient Name: SAVON, GIUFFRIDA 07/25/2015 12:45 Date of Service: PM Medical Record [id number] Number: Patient Account Number: [id number] Apr 20, 1929 (81 y.o. Treating RN: Cornell Barman Date of Birth/Sex: Female) Other Clinician: Primary Care Physician: DOSS, CARRIE Treating BURNS III, Hilliary Jock Referring Physician: Lorane Gell Physician/Extender: Weeks in Treatment: 2 History of Present Illness HPI Description: Pleasant 81 year old with history of Arthritis;GERD (gastroesophageal reflux disease);Hypertension;Chronic kidney diseaseo;Vasculitis, ANCA positive, pulmonary hemorrhage;Thyroid disease;hyperthyroid, had radioactiveiodine treatment;Renal cell carcinoma 2003 s/p sgy for removal right kidney;Hemorrhoids.o She has also had several surgeries including abdominal hysterectomy with bilateral salpingo-oophorectomy, hernia repair, fracture of  her femur on the right side, cholecystectomy, cardiac catheterization. She traumatized her right calf at 2 locations in November 2016. More recently fell and suffered a skin tear to her right hand (healed). Ambulating per her baseline. No significant pain. Right ABI 0.75. Performing dressing changes with silver alginate and using a Tubigrip for edema control. She returns to clinic for follow-up and is without complaints other than the silver alginate sticking to her wounds. No significant pain. No fever or chills. Minimal drainage. Electronic Signature(s) Signed: 07/25/2015 3:48:01 PM By: Loletha Grayer MD Entered By: Loletha Grayer on 07/25/2015 13:34:05 Szostak, [name] ([id number]) -------------------------------------------------------------------------------- Physical Exam Details Patient Name: LEOMIA, KINSLOW 07/25/2015 12:45 Date of Service: PM Medical Record [id number] Number: Patient Account Number: [id number] [birth year] (81  y.o. Treating RN: Cornell Barman Date of Birth/Sex: Female) Other Clinician: Primary Care Physician: DOSS, CARRIE Treating BURNS III, Jaxsen Bernhart Referring Physician: Lorane Gell Physician/Extender: Weeks in Treatment: 2 Constitutional . Pulse regular. Respirations normal and unlabored. Afebrile. . Notes Right lateral and medial calf ulcerations improved. Both full-thickness. No evidence for infection. Localized edema. Faintly palpable DP. Right ABI 0.75. Right hand skin tear is reepithelialized. Electronic Signature(s) Signed: 07/25/2015 3:48:01 PM By: Loletha Grayer MD Entered By: Loletha Grayer on 07/25/2015 13:34:37 Wendell, [name] ([id number]) -------------------------------------------------------------------------------- Physician Orders Details Patient Name: CALLISTO, WENDEL 07/25/2015 12:45 Date of Service: PM Medical Record [id number] Number: Patient Account Number: [id number] 12-07-1928 (81 y.o. Treating RN: Ahmed Prima Date of Birth/Sex: Female) Other Clinician: Primary Care Physician: DOSS, CARRIE Treating BURNS III, Miciah Covelli Referring Physician: Lorane Gell Physician/Extender: Suella [place] in Treatment: 2 Verbal / Phone Orders: Yes ClinicianCarolyne Fiscal, Debi Read Back and Verified: Yes Diagnosis Coding Wound Cleansing Wound #3 Right,Medial Lower Leg o Clean wound with Normal Saline. Wound #4 Right,Lateral Lower Leg o Clean wound with Normal Saline. Anesthetic Wound #3 Right,Medial Lower Leg o Topical Lidocaine 4% cream applied to wound bed prior to debridement Primary Wound Dressing Wound #3 Right,Medial Lower Leg o Prisma Ag Wound #4 Right,Lateral Lower Leg o Prisma Ag Secondary Dressing Wound #3 Right,Medial Lower Leg o Boardered Foam Dressing Wound #4 Right,Lateral Lower Leg o Boardered Foam Dressing Dressing Change Frequency Wound #3 Right,Medial Lower Leg o Change dressing every other day. Wound #4 Right,Lateral Lower Leg o  Change dressing every other day. Follow-up Appointments Wound #3 Right,Medial Lower Leg Cockrell, Sanayah W. ([id number]) o Return Appointment in 1 week. Wound #4 Right,Lateral Lower Leg o Return Appointment in 1 week. Edema Control Wound #3 Right,Medial Lower Leg o Elevate legs to the level of the heart and pump ankles as often as possible o Tubigrip Wound #4 Right,Lateral Lower Leg o Elevate legs to the level of the heart and pump ankles as often as possible o Tubigrip Electronic Signature(s) Signed: 07/25/2015 3:48:01 PM By: Loletha Grayer MD Signed: 07/25/2015 4:19:08 PM By: Alric Quan Entered By: Alric Quan on 07/25/2015 13:24:11 App, [name] ([id number]) -------------------------------------------------------------------------------- Problem List Details Patient Name: ASLIN, KRUSZYNSKI. 07/25/2015 12:45 Date of Service: PM Medical Record [id number] Number: Patient Account Number: [id number] 31-May-1929 (81 y.o. Treating RN: Cornell Barman Date of Birth/Sex: Female) Other Clinician: Primary Care Physician: Flonnie Overman, CARRIE Treating BURNS III, Jann Ra Referring Physician: Lorane Gell Physician/Extender: Weeks in Treatment: 2 Active Problems ICD-10 Encounter Code Description Active Date Diagnosis L97.212 Non-pressure chronic ulcer of right calf with fat layer 07/11/2015 Yes exposed R60.0 Localized edema 07/11/2015 Yes Inactive Problems Resolved Problems ICD-10 Code Description Active Date Resolved Date S60.511A Abrasion of right  hand, initial encounter 07/11/2015 07/11/2015 Electronic Signature(s) Signed: 07/25/2015 3:48:01 PM By: Loletha Grayer MD Entered By: Loletha Grayer on 07/25/2015 13:32:10 Soltero, [name] ([id number]) -------------------------------------------------------------------------------- Progress Note Details Patient Name: HIMANI, WHITTY 07/25/2015 12:45 Date of Service: PM Medical  Record [id number] Number: Patient Account Number: [id number] 06-14-1929 (81 y.o. Treating RN: Cornell Barman Date of Birth/Sex: Female) Other Clinician: Primary Care Physician: DOSS, CARRIE Treating BURNS III, Angellica Maddison Referring Physician: Lorane Gell Physician/Extender: Weeks in Treatment: 2 Subjective Chief Complaint Information obtained from Patient Right calf traumatic ulceration o2. Right hand skin tear (healed). History of Present Illness (HPI) Pleasant 81 year old with history of Arthritis;GERD (gastroesophageal reflux disease);Hypertension;Chronic kidney disease ;Vasculitis, ANCA positive, pulmonary hemorrhage;Thyroid disease;hyperthyroid, had radioactiveiodine treatment;Renal cell carcinoma 2003 s/p sgy for removal right kidney;Hemorrhoids. She has also had several surgeries including abdominal hysterectomy with bilateral salpingo-oophorectomy, hernia repair, fracture of her femur on the right side, cholecystectomy, cardiac catheterization. She traumatized her right calf at 2 locations in November 2016. More recently fell and suffered a skin tear to her right hand (healed). Ambulating per her baseline. No significant pain. Right ABI 0.75. Performing dressing changes with silver alginate and using a Tubigrip for edema control. She returns to clinic for follow-up and is without complaints other than the silver alginate sticking to her wounds. No significant pain. No fever or chills. Minimal drainage. Objective Constitutional Pulse regular. Respirations normal and unlabored. Afebrile. Vitals Time Taken: 12:52 PM, Height: 60 in, Weight: 128 lbs, BMI: 25, Temperature: 98.2 F, Pulse: 72 bpm, Respiratory Rate: 18 breaths/min, Blood Pressure: 120/60 mmHg. General Notes: Right lateral and medial calf ulcerations improved. Both full-thickness. No evidence for infection. Localized edema. Faintly palpable DP. Right ABI 0.75. Right hand skin tear is reepithelialized. Integumentary (Hair,  Skin) Fonder, Ortencia W. ([id number]) Wound #3 status is Open. Original cause of wound was Trauma. The wound is located on the Right,Medial Lower Leg. The wound measures 2.6cm length x 1.6cm width x 0.1cm depth; 3.267cm^2 area and 0.327cm^3 volume. The wound is limited to skin breakdown. There is no tunneling or undermining noted. There is a medium amount of serosanguineous drainage noted. The wound margin is flat and intact. There is small (1-33%) pink, pale granulation within the wound bed. There is a medium (34-66%) amount of necrotic tissue within the wound bed including Eschar and Adherent Slough. The periwound skin appearance exhibited: Localized Edema, Moist. The periwound skin appearance did not exhibit: Callus, Crepitus, Excoriation, Fluctuance, Friable, Induration, Rash, Scarring, Dry/Scaly, Maceration, Atrophie Blanche, Cyanosis, Ecchymosis, Hemosiderin Staining, Mottled, Pallor, Rubor, Erythema. Periwound temperature was noted as No Abnormality. The periwound has tenderness on palpation. Wound #4 status is Open. Original cause of wound was Trauma. The wound is located on the Right,Lateral Lower Leg. The wound measures 1.6cm length x 0.5cm width x 0.1cm depth; 0.628cm^2 area and 0.063cm^3 volume. The wound is limited to skin breakdown. There is no tunneling or undermining noted. There is a none present amount of drainage noted. The wound margin is flat and intact. There is no granulation within the wound bed. There is a large (67-100%) amount of necrotic tissue within the wound bed including Eschar. The periwound skin appearance exhibited: Localized Edema. The periwound skin appearance did not exhibit: Callus, Crepitus, Excoriation, Fluctuance, Friable, Induration, Rash, Scarring, Dry/Scaly, Maceration, Moist, Atrophie Blanche, Cyanosis, Ecchymosis, Hemosiderin Staining, Mottled, Pallor, Rubor, Erythema. Periwound temperature was noted as No Abnormality. The periwound has tenderness on  palpation. Assessment Active Problems ICD-10 L97.212 - Non-pressure chronic ulcer of right calf  with fat layer exposed R60.0 - Localized edema Right calf traumatic ulcerations, exacerbated by edema. Procedures Wound #3 Wound #3 is a Trauma, Other located on the Right,Medial Lower Leg . There was a Skin/Subcutaneous Tissue Debridement [id number]) debridement with total area of 4.16 sq cm performed by BURNS III, Teressa Senter., MD. with the following instrument(s): Curette to remove Viable and Non-Viable tissue/material including Exudate, Fat, Fibrin/Slough, and Subcutaneous after achieving pain control using Other (lidocaine 4% cream). A time out was conducted prior to the start of the procedure. A Minimum amount of bleeding was controlled with Pressure. The procedure was tolerated well with a pain level of 0 throughout and a pain Urda, [place] ([id number]) level of 0 following the procedure. Post Debridement Measurements: 2.6cm length x 1.6cm width x 0.2cm depth; 0.653cm^3 volume. Post procedure Diagnosis Wound #3: Same as Pre-Procedure Wound #4 Wound #4 is a Trauma, Other located on the Right,Lateral Lower Leg . There was a Skin/Subcutaneous Tissue Debridement [id number]) debridement with total area of 0.8 sq cm performed by BURNS III, Teressa Senter., MD. with the following instrument(s): Curette to remove Viable and Non-Viable tissue/material including Exudate, Fat, Fibrin/Slough, and Subcutaneous after achieving pain control using Other (lidocaine 4% cream). A time out was conducted prior to the start of the procedure. A Minimum amount of bleeding was controlled with Pressure. The procedure was tolerated well with a pain level of 0 throughout and a pain level of 0 following the procedure. Post Debridement Measurements: 1.6cm length x 0.5cm width x 0.2cm depth; 0.126cm^3 volume. Post procedure Diagnosis Wound #4: Same as Pre-Procedure Plan Wound Cleansing: Wound #3 Right,Medial Lower  Leg: Clean wound with Normal Saline. Wound #4 Right,Lateral Lower Leg: Clean wound with Normal Saline. Anesthetic: Wound #3 Right,Medial Lower Leg: Topical Lidocaine 4% cream applied to wound bed prior to debridement Primary Wound Dressing: Wound #3 Right,Medial Lower Leg: Prisma Ag Wound #4 Right,Lateral Lower Leg: Prisma Ag Secondary Dressing: Wound #3 Right,Medial Lower Leg: Boardered Foam Dressing Wound #4 Right,Lateral Lower Leg: Boardered Foam Dressing Dressing Change Frequency: Wound #3 Right,Medial Lower Leg: Change dressing every other day. Wound #4 Right,Lateral Lower Leg: Change dressing every other day. Follow-up Appointments: Wound #3 Right,Medial Lower Leg: Return Appointment in 1 week. Wound #4 Right,Lateral Lower Leg: Hild, Aubriee W. ([id number]) Return Appointment in 1 week. Edema Control: Wound #3 Right,Medial Lower Leg: Elevate legs to the level of the heart and pump ankles as often as possible Tubigrip Wound #4 Right,Lateral Lower Leg: Elevate legs to the level of the heart and pump ankles as often as possible Tubigrip Switch to Promogran Prisma. Tubigrip for edema control. Electronic Signature(s) Signed: 07/25/2015 3:48:01 PM By: Loletha Grayer MD Entered By: Loletha Grayer on 07/25/2015 13:35:04 Runyan, [name] ([id number]) -------------------------------------------------------------------------------- SuperBill Details Patient Name: [name] Date of Service: 07/25/2015 Medical Record Patient Account Number: [id number] Number: Treating RN: Cornell Barman 1929-02-09 (81 y.o. Other Clinician: Date of Birth/Sex: Female) Treating BURNS III, Primary Care Physician: Lorane Gell Physician/Extender: Thayer Jew Referring Physician: Carmine Savoy in Treatment: 2 Diagnosis Coding ICD-10 Codes Code Description [id number] Non-pressure chronic ulcer of right calf with fat layer exposed R60.0 Localized edema Facility  Procedures CPT4 Code: [id number] Description: [id number] - DEB SUBQ TISSUE 20 SQ CM/< ICD-10 Description Diagnosis R60.0 Localized edema Modifier: Quantity: 1 Physician Procedures CPT4 Code: [id number] Description: [id number] - WC PHYS SUBQ TISS 20 SQ CM ICD-10 Description Diagnosis R60.0 Localized edema Modifier: Quantity: 1 Electronic Signature(s) Signed: 07/25/2015 3:48:01 PM By: Kathreen Cosier,  Thayer Jew MD Entered By: Loletha Grayer on 07/25/2015 13:35:13

## 2015-08-01 ENCOUNTER — Encounter: Payer: Medicare Other | Admitting: Surgery

## 2015-08-01 DIAGNOSIS — L97212 Non-pressure chronic ulcer of right calf with fat layer exposed: Secondary | ICD-10-CM | POA: Diagnosis not present

## 2015-08-02 NOTE — Progress Notes (Addendum)
JANAL, DEBARROS ([id number]) Visit Report for 08/01/2015 Arrival Information Details Patient Name: [name], [name]. Date of Service: 08/01/2015 3:00 PM Medical Record Number: [id number] Patient Account Number: [id number] Date of Birth/Sex: 1929-06-16 (81 y.o. Female) Treating RN: Afful, RN, BSN, Administrator, sports Primary Care Physician: Lorane Gell Other Clinician: Referring Physician: Lorane Gell Treating Physician/Extender: BURNS III, WALTER Weeks in Treatment: 3 Visit Information History Since Last Visit Added or deleted any medications: No Patient Arrived: Ambulatory Any new allergies or adverse reactions: No Arrival Time: 14:41 Had a fall or experienced change in No Accompanied By: self activities of daily living that may affect Transfer Assistance: None risk of falls: Patient Requires Transmission-Based No Signs or symptoms of abuse/neglect since last No Precautions: visito Patient Has Alerts: No Hospitalized since last visit: No Has Dressing in Place as Prescribed: Yes Pain Present Now: No Electronic Signature(s) Signed: 08/01/2015 5:10:02 PM By: Regan Lemming BSN, RN Entered By: Regan Lemming on 08/01/2015 14:44:13 Ericson, [name] ([id number]) -------------------------------------------------------------------------------- Encounter Discharge Information Details Patient Name: [name] Date of Service: 08/01/2015 3:00 PM Medical Record Number: [id number] Patient Account Number: [id number] Date of Birth/Sex: 02-11-1929 (81 y.o. Female) Treating RN: Afful, RN, BSN, Velva Harman Primary Care Physician: Lorane Gell Other Clinician: Referring Physician: Lorane Gell Treating Physician/Extender: BURNS III, Charlean Sanfilippo in Treatment: 3 Encounter Discharge Information Items Discharge Pain Level: 0 Discharge Condition: Stable Ambulatory Status: Ambulatory Discharge Destination: Home Transportation: Private Auto Accompanied By: self Schedule Follow-up Appointment: No Medication  Reconciliation completed and provided to Patient/Care No Karlye Ihrig: Provided on Clinical Summary of Care: 08/01/2015 Form Type Recipient Paper Patient nb Electronic Signature(s) Signed: 08/01/2015 5:10:02 PM By: Regan Lemming BSN, RN Previous Signature: 08/01/2015 2:58:57 PM Version By: Sharon Mt Entered By: Regan Lemming on 08/01/2015 15:00:51 Eliot, [name] ([id number]) -------------------------------------------------------------------------------- Lower Extremity Assessment Details Patient Name: [name] Date of Service: 08/01/2015 3:00 PM Medical Record Number: [id number] Patient Account Number: [id number] Date of Birth/Sex: 1929/05/06 (81 y.o. Female) Treating RN: Afful, RN, BSN, Velva Harman Primary Care Physician: Lorane Gell Other Clinician: Referring Physician: Lorane Gell Treating Physician/Extender: BURNS III, WALTER Weeks in Treatment: 3 Edema Assessment Assessed: [Left: No] [Right: No] E[Left: dema] [Right: :] Calf Left: Right: Point of Measurement: cm From Medial Instep cm 35.2 cm Ankle Left: Right: Point of Measurement: cm From Medial Instep cm 21.4 cm Vascular Assessment Claudication: Claudication Assessment [Right:None] Pulses: Posterior Tibial Dorsalis Pedis Palpable: [Right:Yes] Extremity colors, hair growth, and conditions: Extremity Color: [Right:Mottled] Hair Growth on Extremity: [Right:No] Temperature of Extremity: [Right:Warm] Capillary Refill: [Right:< 3 seconds] Toe Nail Assessment Left: Right: Thick: No Discolored: No Deformed: No Improper Length and Hygiene: No Electronic Signature(s) Signed: 08/01/2015 5:10:02 PM By: Regan Lemming BSN, RN Entered By: Regan Lemming on 08/01/2015 14:47:48 Haubner, [name] ([id number]) Millea, [name] ([id number]) -------------------------------------------------------------------------------- Multi Wound Chart Details Patient Name: [name] Date of Service: 08/01/2015 3:00 PM Medical Record  Number: [id number] Patient Account Number: [id number] Date of Birth/Sex: January 22, 1929 (81 y.o. Female) Treating RN: Afful, RN, BSN, Velva Harman Primary Care Physician: Lorane Gell Other Clinician: Referring Physician: Lorane Gell Treating Physician/Extender: BURNS III, WALTER Weeks in Treatment: 3 Vital Signs Height(in): 60 Pulse(bpm): 77 Weight(lbs): 128 Blood Pressure 130/68 (mmHg): Body Mass Index(BMI): 25 Temperature(F): 98.1 Respiratory Rate 18 (breaths/min): Photos: [3:No Photos] [4:No Photos] [N/A:N/A] Wound Location: [3:Right Lower Leg - Medial] [4:Right Lower Leg - Lateral] [N/A:N/A] Wounding Event: [3:Trauma] [4:Trauma] [N/A:N/A] Primary Etiology: [3:Trauma, Other] [4:Trauma, Other] [N/A:N/A] Comorbid History: [3:Cataracts, Chronic sinus problems/congestion, Angina, Hypertension, Vasculitis, Osteoarthritis] [4:Cataracts,  Chronic sinus problems/congestion, Angina, Hypertension, Vasculitis, Osteoarthritis] [N/A:N/A] Date Acquired: [3:07/04/2015] [4:06/19/2015] [N/A:N/A] Weeks of Treatment: [3:3] [4:3] [N/A:N/A] Wound Status: [3:Open] [4:Open] [N/A:N/A] Measurements L x W x D 2.4x1.2x0.1 [4:0.5x0.5x0.1] [N/A:N/A] (cm) Area (cm) : [3:2.262] [4:0.196] [N/A:N/A] Volume (cm) : [3:0.226] [4:0.02] [N/A:N/A] % Reduction in Area: [3:20.00%] [4:95.10%] [N/A:N/A] % Reduction in Volume: 60.00% [4:95.00%] [N/A:N/A] Classification: [3:Full Thickness Without Exposed Support Structures] [4:Partial Thickness] [N/A:N/A] Exudate Amount: [3:Medium] [4:None Present] [N/A:N/A] Exudate Type: [3:Serosanguineous] [4:N/A] [N/A:N/A] Exudate Color: [3:red, brown] [4:N/A] [N/A:N/A] Wound Margin: [3:Flat and Intact] [4:Flat and Intact] [N/A:N/A] Granulation Amount: [3:Small (1-33%)] [4:None Present (0%)] [N/A:N/A] Granulation Quality: [3:Pink, Pale] [4:N/A] [N/A:N/A] Necrotic Amount: [3:Medium (34-66%)] [4:Large (67-100%)] [N/A:N/A] Necrotic Tissue: [3:Eschar, Adherent Slough] [4:Eschar]  [N/A:N/A] Exposed Structures: [3:Fascia: No Fat: No] [4:Fascia: No Fat: No] [N/A:N/A] Tendon: No Tendon: No Muscle: No Muscle: No Joint: No Joint: No Bone: No Bone: No Limited to Skin Limited to Skin Breakdown Breakdown Epithelialization: None Medium (34-66%) N/A Periwound Skin Texture: Edema: Yes Edema: Yes N/A Excoriation: No Excoriation: No Induration: No Induration: No Callus: No Callus: No Crepitus: No Crepitus: No Fluctuance: No Fluctuance: No Friable: No Friable: No Rash: No Rash: No Scarring: No Scarring: No Periwound Skin Moist: Yes Dry/Scaly: Yes N/A Moisture: Maceration: No Maceration: No Dry/Scaly: No Moist: No Periwound Skin Color: Atrophie Blanche: No Atrophie Blanche: No N/A Cyanosis: No Cyanosis: No Ecchymosis: No Ecchymosis: No Erythema: No Erythema: No Hemosiderin Staining: No Hemosiderin Staining: No Mottled: No Mottled: No Pallor: No Pallor: No Rubor: No Rubor: No Temperature: No Abnormality No Abnormality N/A Tenderness on Yes Yes N/A Palpation: Wound Preparation: Ulcer Cleansing: Ulcer Cleansing: N/A Rinsed/Irrigated with Rinsed/Irrigated with Saline Saline Topical Anesthetic Topical Anesthetic Applied: Other: lidocaine Applied: Other: lidocaine 4% 4% Treatment Notes Electronic Signature(s) Signed: 08/01/2015 5:10:02 PM By: Regan Lemming BSN, RN Entered By: Regan Lemming on 08/01/2015 14:53:03 Welty, [name] ([id number]) -------------------------------------------------------------------------------- [place] Details Patient Name: [name] Date of Service: 08/01/2015 3:00 PM Medical Record Number: [id number] Patient Account Number: [id number] Date of Birth/Sex: 06-24-1929 (81 y.o. Female) Treating RN: Baruch Gouty, RN, BSN, Velva Harman Primary Care Physician: Lorane Gell Other Clinician: Referring Physician: Lorane Gell Treating Physician/Extender: BURNS III, Charlean Sanfilippo in Treatment: 3 Active  Inactive Electronic Signature(s) Signed: 09/04/2015 4:08:11 PM By: Regan Lemming BSN, RN Previous Signature: 08/01/2015 5:10:02 PM Version By: Regan Lemming BSN, RN Entered By: Regan Lemming on 09/04/2015 16:08:10 Tabares, [name] ([id number]) -------------------------------------------------------------------------------- Pain Assessment Details Patient Name: [name] Date of Service: 08/01/2015 3:00 PM Medical Record Number: [id number] Patient Account Number: [id number] Date of Birth/Sex: 1929-01-29 (81 y.o. Female) Treating RN: Afful, RN, BSN, Velva Harman Primary Care Physician: Lorane Gell Other Clinician: Referring Physician: Lorane Gell Treating Physician/Extender: BURNS III, WALTER Weeks in Treatment: 3 Active Problems Location of Pain Severity and Description of Pain Patient Has Paino No Site Locations Pain Management and Medication Current Pain Management: Electronic Signature(s) Signed: 08/01/2015 5:10:02 PM By: Regan Lemming BSN, RN Entered By: Regan Lemming on 08/01/2015 14:44:19 Graybeal, [name] ([id number]) -------------------------------------------------------------------------------- Patient/Caregiver Education Details Patient Name: [name] Date of Service: 08/01/2015 3:00 PM Medical Record Number: [id number] Patient Account Number: [id number] Date of Birth/Gender: Dec 03, 1928 (81 y.o. Female) Treating RN: Baruch Gouty, RN, BSN, Velva Harman Primary Care Physician: Lorane Gell Other Clinician: Referring Physician: Lorane Gell Treating Physician/Extender: BURNS III, Charlean Sanfilippo in Treatment: 3 Education Assessment Education Provided To: Patient Education Topics Provided Welcome To [facility]: Methods: Explain/Verbal Responses: State content correctly Electronic Signature(s) Signed: 08/01/2015 5:10:02 PM By:  Afful, [employer], RN Entered By: Regan Lemming on 08/01/2015 15:01:03 Gutman, [name]  ([id number]) -------------------------------------------------------------------------------- Wound Assessment Details Patient Name: [name], [name] Date of Service: 08/01/2015 3:00 PM Medical Record Number: [id number] Patient Account Number: [id number] Date of Birth/Sex: 1928/09/18 (81 y.o. Female) Treating RN: Afful, RN, BSN, Velva Harman Primary Care Physician: Lorane Gell Other Clinician: Referring Physician: Lorane Gell Treating Physician/Extender: BURNS III, WALTER Weeks in Treatment: 3 Wound Status Wound Number: 3 Primary Trauma, Other Etiology: Wound Location: Right Lower Leg - Medial Wound Open Wounding Event: Trauma Status: Date Acquired: 07/04/2015 Comorbid Cataracts, Chronic sinus Weeks Of Treatment: 3 History: problems/congestion, Angina, Clustered Wound: No Hypertension, Vasculitis, Osteoarthritis Photos Photo Uploaded By: Regan Lemming on 08/01/2015 16:39:17 Wound Measurements Length: (cm) 2.4 Width: (cm) 1.2 Depth: (cm) 0.1 Area: (cm) 2.262 Volume: (cm) 0.226 % Reduction in Area: 20% % Reduction in Volume: 60% Epithelialization: None Tunneling: No Undermining: No Wound Description Full Thickness Without Exposed Classification: Support Structures Wound Margin: Flat and Intact Medium Ekblad, Lively W. ([id number]) Foul Odor After Cleansing: No Exudate Amount: Exudate Type: Serosanguineous Exudate Color: red, brown Wound Bed Granulation Amount: Small (1-33%) Exposed Structure Granulation Quality: Pink, Pale Fascia Exposed: No Necrotic Amount: Medium (34-66%) Fat Layer Exposed: No Necrotic Quality: Eschar, Adherent Slough Tendon Exposed: No Muscle Exposed: No Joint Exposed: No Bone Exposed: No Limited to Skin Breakdown Periwound Skin Texture Texture Color No Abnormalities Noted: No No Abnormalities Noted: No Callus: No Atrophie Blanche: No Crepitus: No Cyanosis: No Excoriation: No Ecchymosis: No Fluctuance: No Erythema: No Friable:  No Hemosiderin Staining: No Induration: No Mottled: No Localized Edema: Yes Pallor: No Rash: No Rubor: No Scarring: No Temperature / Pain Moisture Temperature: No Abnormality No Abnormalities Noted: No Tenderness on Palpation: Yes Dry / Scaly: No Maceration: No Moist: Yes Wound Preparation Ulcer Cleansing: Rinsed/Irrigated with Saline Topical Anesthetic Applied: Other: lidocaine 4%, Treatment Notes Wound #3 (Right, Medial Lower Leg) 1. Cleansed with: Clean wound with Normal Saline 2. Anesthetic Topical Lidocaine 4% cream to wound bed prior to debridement 4. Dressing Applied: Prisma Ag 5. Secondary Dressing Applied Petrelli, [place] ([id number]) Bordered Foam Dressing 7. Secured with Financial risk analyst) Signed: 08/01/2015 5:10:02 PM By: Regan Lemming BSN, RN Entered By: Regan Lemming on 08/01/2015 14:46:32 Boyd, [name] ([id number]) -------------------------------------------------------------------------------- Wound Assessment Details Patient Name: [name] Date of Service: 08/01/2015 3:00 PM Medical Record Number: [id number] Patient Account Number: [id number] Date of Birth/Sex: [birth year] (81 y.o. Female) Treating RN: Afful, RN, BSN, Velva Harman Primary Care Physician: Lorane Gell Other Clinician: Referring Physician: Lorane Gell Treating Physician/Extender: BURNS III, WALTER Weeks in Treatment: 3 Wound Status Wound Number: 4 Primary Trauma, Other Etiology: Wound Location: Right Lower Leg - Lateral Wound Open Wounding Event: Trauma Status: Date Acquired: 06/19/2015 Comorbid Cataracts, Chronic sinus Weeks Of Treatment: 3 History: problems/congestion, Angina, Clustered Wound: No Hypertension, Vasculitis, Osteoarthritis Photos Photo Uploaded By: Regan Lemming on 08/01/2015 16:39:17 Wound Measurements Length: (cm) 0.5 Width: (cm) 0.5 Depth: (cm) 0.1 Area: (cm) 0.196 Volume: (cm) 0.02 % Reduction in Area: 95.1% % Reduction in Volume:  95% Epithelialization: Medium (34-66%) Tunneling: No Undermining: No Wound Description Classification: Partial Thickness Wound Margin: Flat and Intact Exudate Amount: None Present Howse, Alyannah W. ([id number]) Foul Odor After Cleansing: No Wound Bed Granulation Amount: None Present (0%) Exposed Structure Necrotic Amount: Large (67-100%) Fascia Exposed: No Necrotic Quality: Eschar Fat Layer Exposed: No Tendon Exposed: No Muscle Exposed: No Joint Exposed: No Bone Exposed: No Limited to Skin Breakdown Periwound Skin Texture Texture Color No  Abnormalities Noted: No No Abnormalities Noted: No Callus: No Atrophie Blanche: No Crepitus: No Cyanosis: No Excoriation: No Ecchymosis: No Fluctuance: No Erythema: No Friable: No Hemosiderin Staining: No Induration: No Mottled: No Localized Edema: Yes Pallor: No Rash: No Rubor: No Scarring: No Temperature / Pain Moisture Temperature: No Abnormality No Abnormalities Noted: No Tenderness on Palpation: Yes Dry / Scaly: Yes Maceration: No Moist: No Wound Preparation Ulcer Cleansing: Rinsed/Irrigated with Saline Topical Anesthetic Applied: Other: lidocaine 4%, Treatment Notes Wound #4 (Right, Lateral Lower Leg) 1. Cleansed with: Clean wound with Normal Saline 2. Anesthetic Topical Lidocaine 4% cream to wound bed prior to debridement 4. Dressing Applied: Prisma Ag 5. Secondary Dressing Applied Bordered Foam Dressing 7. Secured with [employer]) MARGEAN, LEONARDIS ([id number]) Signed: 08/01/2015 5:10:02 PM By: Regan Lemming BSN, RN Entered By: Regan Lemming on 08/01/2015 14:46:49 Skalski, [name] ([id number]) -------------------------------------------------------------------------------- Vitals Details Patient Name: [name] Date of Service: 08/01/2015 3:00 PM Medical Record Number: [id number] Patient Account Number: [id number] Date of Birth/Sex: 1929/02/10 (81 y.o. Female) Treating RN: Afful,  RN, BSN, Velva Harman Primary Care Physician: Lorane Gell Other Clinician: Referring Physician: Lorane Gell Treating Physician/Extender: BURNS III, WALTER Weeks in Treatment: 3 Vital Signs Time Taken: 14:44 Temperature (F): 98.1 Height (in): 60 Pulse (bpm): 77 Weight (lbs): 128 Respiratory Rate (breaths/min): 18 Body Mass Index (BMI): 25 Blood Pressure (mmHg): 130/68 Reference Range: 80 - 120 mg / dl Electronic Signature(s) Signed: 08/01/2015 5:10:02 PM By: Regan Lemming BSN, RN Entered By: Regan Lemming on 08/01/2015 14:44:54

## 2015-08-02 NOTE — Progress Notes (Signed)
MARISOL, BANKEN ([id number]) Visit Report for 08/01/2015 Chief Complaint Document Details Patient Name: [name], [name]. Date of Service: 08/01/2015 3:00 PM Medical Record Patient Account Number: [id number] Number: Treating RN: Baruch Gouty, RN, BSN, Velva Harman 1929-03-17 [phone] y.o. Other Clinician: Date of Birth/Sex: Female) Treating BURNS III, Primary Care Physician: Lorane Gell Physician/Extender: Thayer Jew Referring Physician: Carmine Savoy in Treatment: 3 Information Obtained from: Patient Chief Complaint Right calf traumatic ulceration. Right hand skin tear (healed). Electronic Signature(s) Signed: 08/01/2015 4:23:19 PM By: Loletha Grayer MD Entered By: Loletha Grayer on 08/01/2015 15:47:11 [name], [name] ([id number]) -------------------------------------------------------------------------------- Debridement Details Patient Name: [name] Date of Service: 08/01/2015 3:00 PM Medical Record Patient Account Number: [id number] Number: Treating RN: Baruch Gouty, RN, BSN, Rita Sep 06, 1928 [phone] y.o. Other Clinician: Date of Birth/Sex: Female) Treating BURNS III, Primary Care Physician: Lorane Gell Physician/Extender: Thayer Jew Referring Physician: Carmine Savoy in Treatment: 3 Debridement Performed for Wound #3 Right,Medial Lower Leg Assessment: Performed By: Physician BURNS III, Teressa Senter., MD Debridement: Debridement Pre-procedure Yes Verification/Time Out Taken: Start Time: 14:50 Pain Control: Lidocaine 4% Topical Solution Level: Skin/Subcutaneous Tissue Total Area Debrided (L x 2.4 (cm) x 1.2 (cm) = 2.88 (cm) W): Tissue and other Viable, Non-Viable, Fat, Fibrin/Slough, Subcutaneous material debrided: Instrument: Curette Bleeding: Minimum Hemostasis Achieved: Pressure End Time: 14:53 Procedural Pain: 0 Post Procedural Pain: 0 Response to Treatment: Procedure was tolerated well Post Debridement Measurements of Total Wound Length: (cm)  2.4 Width: (cm) 1.2 Depth: (cm) 0.2 Volume: (cm) 0.452 Post Procedure Diagnosis Same as Pre-procedure Electronic Signature(s) Signed: 08/01/2015 4:23:19 PM By: Loletha Grayer MD Signed: 08/01/2015 5:10:02 PM By: Regan Lemming BSN, RN Entered By: Loletha Grayer on 08/01/2015 15:46:56 [name], [name] ([id number]) -------------------------------------------------------------------------------- HPI Details Patient Name: [name] Date of Service: 08/01/2015 3:00 PM Medical Record Patient Account Number: [id number] Number: Treating RN: Baruch Gouty, RN, BSN, Rita [birth year] (81 y.o. Other Clinician: Date of Birth/Sex: Female) Treating BURNS III, Primary Care Physician: Lorane Gell Physician/Extender: Thayer Jew Referring Physician: Carmine Savoy in Treatment: 3 History of Present Illness HPI Description: Pleasant 81 year old with history of Arthritis;GERD (gastroesophageal reflux disease);Hypertension;Chronic kidney diseaseo;Vasculitis, ANCA positive, pulmonary hemorrhage;Thyroid disease;hyperthyroid, had radioactiveiodine treatment;Renal cell carcinoma 2003 s/p sgy for removal right kidney;Hemorrhoids.o She has also had several surgeries including abdominal hysterectomy with bilateral salpingo-oophorectomy, hernia repair, fracture of her femur on the right side, cholecystectomy, cardiac catheterization. She traumatized her right calf at 2 locations in November 2016. More recently fell and suffered a skin tear to her right hand (healed). Ambulating per her baseline. No significant pain. Right ABI 0.75. Performing dressing changes with silver alginate and using a Tubigrip for edema control. She returns to clinic for follow-up and is without complaints other than the silver alginate sticking to her wounds. No significant pain. No fever or chills. Minimal drainage. Electronic Signature(s) Signed: 08/01/2015 4:23:19 PM By: Loletha Grayer MD Entered By: Loletha Grayer on 08/01/2015 15:47:18 [name], [name] ([id number]) -------------------------------------------------------------------------------- Physical Exam Details Patient Name: [name] Date of Service: 08/01/2015 3:00 PM Medical Record Patient Account Number: [id number] Number: Treating RN: Baruch Gouty, RN, BSN, Velva Harman 1929/05/22 [phone] y.o. Other Clinician: Date of Birth/Sex: Female) Treating BURNS III, Primary Care Physician: Lorane Gell Physician/Extender: Thayer Jew Referring Physician: Lorane Gell Weeks in Treatment: 3 Constitutional . Pulse regular. Respirations normal and unlabored. Afebrile. . Notes Right lateral and medial calf ulcerations improved. Lateral one appears healed. No evidence for infection. Localized edema. Faintly palpable DP. Right ABI  0.75. Right hand skin tear is reepithelialized. Electronic Signature(s) Signed: 08/01/2015 4:23:19 PM By: Loletha Grayer MD Entered By: Loletha Grayer on 08/01/2015 15:47:54 [name], [name] ([id number]) -------------------------------------------------------------------------------- Physician Orders Details Patient Name: [name] Date of Service: 08/01/2015 3:00 PM Medical Record Patient Account Number: [id number] Number: Treating RN: Baruch Gouty, RN, BSN, Rita 12/28/1928 [phone] y.o. Other Clinician: Date of Birth/Sex: Female) Treating BURNS III, Primary Care Physician: Lorane Gell Physician/Extender: Thayer Jew Referring Physician: Carmine Savoy in Treatment: 3 Verbal / Phone Orders: Yes Clinician: Afful, RN, BSN, Rita Read Back and Verified: Yes Diagnosis Coding Wound Cleansing Wound #3 Right,Medial Lower Leg o Clean wound with Normal Saline. Wound #4 Right,Lateral Lower Leg o Clean wound with Normal Saline. Anesthetic Wound #3 Right,Medial Lower Leg o Topical Lidocaine 4% cream applied to wound bed prior to debridement Primary Wound Dressing Wound #3 Right,Medial Lower  Leg o Prisma Ag Wound #4 Right,Lateral Lower Leg o Prisma Ag Secondary Dressing Wound #3 Right,Medial Lower Leg o Boardered Foam Dressing Wound #4 Right,Lateral Lower Leg o Boardered Foam Dressing Dressing Change Frequency Wound #3 Right,Medial Lower Leg o Change dressing every other day. Wound #4 Right,Lateral Lower Leg o Change dressing every other day. Follow-up Appointments Wound #3 Right,Medial Lower Leg Grullon, Rufus W. ([id number]) o Return Appointment in 2 weeks. Wound #4 Right,Lateral Lower Leg o Return Appointment in 2 weeks. Edema Control Wound #3 Right,Medial Lower Leg o Elevate legs to the level of the heart and pump ankles as often as possible o Tubigrip Wound #4 Right,Lateral Lower Leg o Elevate legs to the level of the heart and pump ankles as often as possible o Tubigrip Electronic Signature(s) Signed: 08/01/2015 4:23:19 PM By: Loletha Grayer MD Signed: 08/01/2015 5:10:02 PM By: Regan Lemming BSN, RN Entered By: Regan Lemming on 08/01/2015 14:58:47 [name], [name] ([id number]) -------------------------------------------------------------------------------- Problem List Details Patient Name: [name] Date of Service: 08/01/2015 3:00 PM Medical Record Patient Account Number: [id number] Number: Treating RN: Baruch Gouty, RN, BSN, Rita [birth year] (81 y.o. Other Clinician: Date of Birth/Sex: Female) Treating BURNS III, Primary Care Physician: Lorane Gell Physician/Extender: Thayer Jew Referring Physician: Carmine Savoy in Treatment: 3 Active Problems ICD-10 Encounter Code Description Active Date Diagnosis L97.212 Non-pressure chronic ulcer of right calf with fat layer 07/11/2015 Yes exposed R60.0 Localized edema 07/11/2015 Yes Inactive Problems Resolved Problems ICD-10 Code Description Active Date Resolved Date S60.511A Abrasion of right hand, initial encounter 07/11/2015 07/11/2015 Electronic  Signature(s) Signed: 08/01/2015 4:23:19 PM By: Loletha Grayer MD Entered By: Loletha Grayer on 08/01/2015 15:46:16 [name], [name] ([id number]) -------------------------------------------------------------------------------- Progress Note Details Patient Name: [name] Date of Service: 08/01/2015 3:00 PM Medical Record Patient Account Number: [id number] Number: Treating RN: Baruch Gouty, RN, BSN, Rita [birth year] (81 y.o. Other Clinician: Date of Birth/Sex: Female) Treating BURNS III, Primary Care Physician: Lorane Gell Physician/Extender: Thayer Jew Referring Physician: Carmine Savoy in Treatment: 3 Subjective Chief Complaint Information obtained from Patient Right calf traumatic ulceration. Right hand skin tear (healed). History of Present Illness (HPI) Pleasant 81 year old with history of Arthritis;GERD (gastroesophageal reflux disease);Hypertension;Chronic kidney disease ;Vasculitis, ANCA positive, pulmonary hemorrhage;Thyroid disease;hyperthyroid, had radioactiveiodine treatment;Renal cell carcinoma 2003 s/p sgy for removal right kidney;Hemorrhoids. She has also had several surgeries including abdominal hysterectomy with bilateral salpingo-oophorectomy, hernia repair, fracture of her femur on the right side, cholecystectomy, cardiac catheterization. She traumatized her right calf at 2 locations in November 2016. More recently fell and suffered a skin tear to her right hand (healed). Ambulating  per her baseline. No significant pain. Right ABI 0.75. Performing dressing changes with silver alginate and using a Tubigrip for edema control. She returns to clinic for follow-up and is without complaints other than the silver alginate sticking to her wounds. No significant pain. No fever or chills. Minimal drainage. Objective Constitutional Pulse regular. Respirations normal and unlabored. Afebrile. Vitals Time Taken: 2:44 PM, Height: 60 in, Weight: 128 lbs, BMI:  25, Temperature: 98.1 F, Pulse: 77 bpm, Respiratory Rate: 18 breaths/min, Blood Pressure: 130/68 mmHg. General Notes: Right lateral and medial calf ulcerations improved. Lateral one appears healed. No evidence for infection. Localized edema. Faintly palpable DP. Right ABI 0.75. Right hand skin tear is reepithelialized. Integumentary (Hair, Skin) [name], Suzanne W. ([id number]) Wound #3 status is Open. Original cause of wound was Trauma. The wound is located on the Right,Medial Lower Leg. The wound measures 2.4cm length x 1.2cm width x 0.1cm depth; 2.262cm^2 area and 0.226cm^3 volume. The wound is limited to skin breakdown. There is no tunneling or undermining noted. There is a medium amount of serosanguineous drainage noted. The wound margin is flat and intact. There is small (1-33%) pink, pale granulation within the wound bed. There is a medium (34-66%) amount of necrotic tissue within the wound bed including Eschar and Adherent Slough. The periwound skin appearance exhibited: Localized Edema, Moist. The periwound skin appearance did not exhibit: Callus, Crepitus, Excoriation, Fluctuance, Friable, Induration, Rash, Scarring, Dry/Scaly, Maceration, Atrophie Blanche, Cyanosis, Ecchymosis, Hemosiderin Staining, Mottled, Pallor, Rubor, Erythema. Periwound temperature was noted as No Abnormality. The periwound has tenderness on palpation. Wound #4 status is Open. Original cause of wound was Trauma. The wound is located on the Right,Lateral Lower Leg. The wound measures 0.5cm length x 0.5cm width x 0.1cm depth; 0.196cm^2 area and 0.02cm^3 volume. The wound is limited to skin breakdown. There is no tunneling or undermining noted. There is a none present amount of drainage noted. The wound margin is flat and intact. There is no granulation within the wound bed. There is a large (67-100%) amount of necrotic tissue within the wound bed including Eschar. The periwound skin appearance exhibited: Localized  Edema, Dry/Scaly. The periwound skin appearance did not exhibit: Callus, Crepitus, Excoriation, Fluctuance, Friable, Induration, Rash, Scarring, Maceration, Moist, Atrophie Blanche, Cyanosis, Ecchymosis, Hemosiderin Staining, Mottled, Pallor, Rubor, Erythema. Periwound temperature was noted as No Abnormality. The periwound has tenderness on palpation. Assessment Active Problems ICD-10 L97.212 - Non-pressure chronic ulcer of right calf with fat layer exposed R60.0 - Localized edema Right calf traumatic ulceration. Procedures Wound #3 Wound #3 is a Trauma, Other located on the Right,Medial Lower Leg . There was a Skin/Subcutaneous Tissue Debridement [id number]) debridement with total area of 2.88 sq cm performed by BURNS III, Teressa Senter., MD. with the following instrument(s): Curette to remove Viable and Non-Viable tissue/material including Fat, Fibrin/Slough, and Subcutaneous after achieving pain control using Lidocaine 4% Topical Solution. A time out was conducted prior to the start of the procedure. A Minimum amount of bleeding was controlled with Pressure. The procedure was tolerated well with a pain level of 0 throughout and a pain level [name], [name]. ([id number]) of 0 following the procedure. Post Debridement Measurements: 2.4cm length x 1.2cm width x 0.2cm depth; 0.452cm^3 volume. Post procedure Diagnosis Wound #3: Same as Pre-Procedure Plan Wound Cleansing: Wound #3 Right,Medial Lower Leg: Clean wound with Normal Saline. Wound #4 Right,Lateral Lower Leg: Clean wound with Normal Saline. Anesthetic: Wound #3 Right,Medial Lower Leg: Topical Lidocaine 4% cream applied to wound bed prior to debridement  Primary Wound Dressing: Wound #3 Right,Medial Lower Leg: Prisma Ag Wound #4 Right,Lateral Lower Leg: Prisma Ag Secondary Dressing: Wound #3 Right,Medial Lower Leg: Boardered Foam Dressing Wound #4 Right,Lateral Lower Leg: Boardered Foam Dressing Dressing Change  Frequency: Wound #3 Right,Medial Lower Leg: Change dressing every other day. Wound #4 Right,Lateral Lower Leg: Change dressing every other day. Follow-up Appointments: Wound #3 Right,Medial Lower Leg: Return Appointment in 2 weeks. Wound #4 Right,Lateral Lower Leg: Return Appointment in 2 weeks. Edema Control: Wound #3 Right,Medial Lower Leg: Elevate legs to the level of the heart and pump ankles as often as possible Tubigrip Wound #4 Right,Lateral Lower Leg: Elevate legs to the level of the heart and pump ankles as often as possible Tubigrip [name], Suzanne W. ([id number]) Prisma. Tubigrip. Electronic Signature(s) Signed: 08/01/2015 4:23:19 PM By: Loletha Grayer MD Entered By: Loletha Grayer on 08/01/2015 15:48:18 [name], [name] ([id number]) -------------------------------------------------------------------------------- SuperBill Details Patient Name: [name] Date of Service: 08/01/2015 Medical Record Patient Account Number: [id number] Number: Treating RN: Baruch Gouty, RN, BSN, Rita 1928/11/26 [phone].o. Other Clinician: Date of Birth/Sex: Female) Treating BURNS III, Primary Care Physician: Lorane Gell Physician/Extender: Thayer Jew Referring Physician: Carmine Savoy in Treatment: 3 Diagnosis Coding ICD-10 Codes Code Description [phone] Non-pressure chronic ulcer of right calf with fat layer exposed R60.0 Localized edema Facility Procedures CPT4 Code: [id number] Description: [id number] - DEB SUBQ TISSUE 20 SQ CM/< ICD-10 Description Diagnosis L97.212 Non-pressure chronic ulcer of right calf with fat Modifier: layer exposed Quantity: 1 Physician Procedures CPT4 Code: [id number] Description: [id number] - WC PHYS SUBQ TISS 20 SQ CM ICD-10 Description Diagnosis L97.212 Non-pressure chronic ulcer of right calf with fat Modifier: layer exposed Quantity: 1 Electronic Signature(s) Signed: 08/01/2015 4:23:19 PM By: Loletha Grayer MD Entered By: Loletha Grayer on 08/01/2015 15:48:27

## 2015-08-15 ENCOUNTER — Ambulatory Visit: Payer: BLUE CROSS/BLUE SHIELD | Admitting: Surgery

## 2015-10-22 ENCOUNTER — Emergency Department
Admission: EM | Admit: 2015-10-22 | Discharge: 2015-10-23 | Disposition: A | Discharge status: Home / Self Care | Payer: Medicare Other | Attending: Emergency Medicine | Admitting: Emergency Medicine

## 2015-10-22 ENCOUNTER — Encounter: Payer: Self-pay | Admitting: Emergency Medicine

## 2015-10-22 ENCOUNTER — Emergency Department: Payer: Medicare Other

## 2015-10-22 DIAGNOSIS — I129 Hypertensive chronic kidney disease with stage 1 through stage 4 chronic kidney disease, or unspecified chronic kidney disease: Secondary | ICD-10-CM | POA: Diagnosis not present

## 2015-10-22 DIAGNOSIS — X58XXXA Exposure to other specified factors, initial encounter: Secondary | ICD-10-CM | POA: Insufficient documentation

## 2015-10-22 DIAGNOSIS — Y998 Other external cause status: Secondary | ICD-10-CM | POA: Diagnosis not present

## 2015-10-22 DIAGNOSIS — Y9389 Activity, other specified: Secondary | ICD-10-CM | POA: Insufficient documentation

## 2015-10-22 DIAGNOSIS — Z79899 Other long term (current) drug therapy: Secondary | ICD-10-CM | POA: Diagnosis not present

## 2015-10-22 DIAGNOSIS — Z880 Allergy status to penicillin: Secondary | ICD-10-CM | POA: Diagnosis not present

## 2015-10-22 DIAGNOSIS — N189 Chronic kidney disease, unspecified: Secondary | ICD-10-CM | POA: Insufficient documentation

## 2015-10-22 DIAGNOSIS — Y9289 Other specified places as the place of occurrence of the external cause: Secondary | ICD-10-CM | POA: Insufficient documentation

## 2015-10-22 DIAGNOSIS — R0789 Other chest pain: Secondary | ICD-10-CM

## 2015-10-22 DIAGNOSIS — Z7982 Long term (current) use of aspirin: Secondary | ICD-10-CM | POA: Insufficient documentation

## 2015-10-22 DIAGNOSIS — Z7952 Long term (current) use of systemic steroids: Secondary | ICD-10-CM | POA: Diagnosis not present

## 2015-10-22 DIAGNOSIS — S299XXA Unspecified injury of thorax, initial encounter: Secondary | ICD-10-CM | POA: Diagnosis present

## 2015-10-22 DIAGNOSIS — S29001A Unspecified injury of muscle and tendon of front wall of thorax, initial encounter: Secondary | ICD-10-CM | POA: Insufficient documentation

## 2015-10-22 DIAGNOSIS — R0781 Pleurodynia: Secondary | ICD-10-CM

## 2015-10-22 LAB — CBC
HCT: 36.9 % (ref 35.0–47.0)
HEMOGLOBIN: 12.4 g/dL (ref 12.0–16.0)
MCH: 32.2 pg (ref 26.0–34.0)
MCHC: 33.7 g/dL (ref 32.0–36.0)
MCV: 95.7 fL (ref 80.0–100.0)
Platelets: 182 10*3/uL (ref 150–440)
RBC: 3.86 MIL/uL (ref 3.80–5.20)
RDW: 13.5 % (ref 11.5–14.5)
WBC: 6.7 10*3/uL (ref 3.6–11.0)

## 2015-10-22 LAB — BASIC METABOLIC PANEL
ANION GAP: 6 (ref 5–15)
BUN: 26 mg/dL — ABNORMAL HIGH (ref 6–20)
CHLORIDE: 105 mmol/L (ref 101–111)
CO2: 24 mmol/L (ref 22–32)
CREATININE: 1.31 mg/dL — AB (ref 0.44–1.00)
Calcium: 8.6 mg/dL — ABNORMAL LOW (ref 8.9–10.3)
GFR calc non Af Amer: 36 mL/min — ABNORMAL LOW (ref >60)
GFR, EST AFRICAN AMERICAN: 42 mL/min — AB (ref >60)
Glucose, Bld: 122 mg/dL — ABNORMAL HIGH (ref 65–99)
Potassium: 3.5 mmol/L (ref 3.5–5.1)
Sodium: 135 mmol/L (ref 135–145)

## 2015-10-22 LAB — TROPONIN I: Troponin I: <0.03 ng/mL (ref <0.031)

## 2015-10-22 LAB — FIBRIN DERIVATIVES D-DIMER (ARMC ONLY): Fibrin derivatives D-dimer (ARMC): 683 — ABNORMAL HIGH (ref 0–499)

## 2015-10-22 MED ORDER — TRAMADOL HCL 50 MG PO TABS
50.0000 mg | ORAL_TABLET | ORAL | Status: AC
  Administered 2015-10-22: 50 mg via ORAL
  Filled 2015-10-22: qty 1

## 2015-10-22 NOTE — ED Provider Notes (Signed)
[facility] Emergency Department Provider Note  ____________________________________________  Time seen: Approximately 10:03 PM  I have reviewed the triage vital signs and the nursing notes.   HISTORY  Chief Complaint Rib Injury    HPI [name] is a 82 y.o. female presents for evaluation of a sharp pain in her left side of the chest.  The patient presents states that she carried laundry down the hallway, she went to laundry room and then after doing laundry kind of bent forward and felt a pop on the left side of the chest. She reports that she feels as sharp pain in the left lower rib cage when she takes a deep breath. She denies any heavy chest pressure or radiating pain. She denies any injury to the arms neck or back. She did not fall.  She does report she feels a little short of breath when she takes a deep breath because of the pain. She would like to have a tramadol, but does not want anything stronger.  Denies a history of any recent surgeries, history of blood clots, coughing up any blood and states that her Wegener's is in remission.  Past Medical History  Diagnosis Date  . Arthritis   . GERD (gastroesophageal reflux disease)   . Hypertension   . Chronic kidney disease   . Colon polyp   . UTI (lower urinary tract infection)   . 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 Thyroid disease 1957    hyperthyroid, had radioactiveiodine treatment   . Renal cell carcinoma ([place]) 2003    s/p sgy for removal right kidney  . Hemorrhoids     Patient Active Problem List   Diagnosis Date Noted  . CN (constipation) 03/01/2015  . Bleeding hemorrhoids 01/12/2015  . Wound, open, arm, forearm 12/29/2014  . Open wound, lower leg 12/22/2014  . Viral URI 12/22/2014  . History of kidney cancer 09/17/2014  . Palpitations 09/17/2014  . Rectal bleeding 01/03/2014  . Personal history of colonic polyps 01/03/2014  . Medicare annual  wellness visit, subsequent 09/01/2013  . Absent pedal pulses 09/01/2013  . Screening for breast cancer 09/01/2013  . Encounter to establish care 08/09/2013  . Serum potassium elevated 08/09/2013  . Chronic kidney disease 08/09/2013    Past Surgical History  Procedure Laterality Date  . Abdominal hysterectomy    . Bilateral salpingoophorectomy    . Hernia repair    . Fracture surgery Right     femur fracture  . Cholecystectomy  1957  . Nose surgery  2000  . Incontinence surgery  2006  . Colonoscopy w/ biopsies  May 27, 2011,01/10/14    tubular adenoma in the ascending colon and descending colon. Tubulovillous adenoma in the upper rectum 12 mm. Diverticulosis.  Marland Kitchen Upper gi endoscopy  May 30, 2004    large hiatal hernia, duodenal diverticulum.    Current Outpatient Rx  Name  Route  Sig  Dispense  Refill  . amLODipine (NORVASC) 5 MG tablet   Oral   Take 5 mg by mouth daily.          Marland Kitchen aspirin 81 MG tablet   Oral   Take 81 mg by mouth daily.         . cholecalciferol (VITAMIN D) 1000 UNITS tablet   Oral   Take 1,000 Units by mouth daily.         Marland Kitchen CRANBERRY PO   Oral   Take 84 mg by  mouth daily.         . ferrous sulfate 325 (65 FE) MG tablet   Oral   Take 1 mg by mouth daily.          . hydrocortisone-pramoxine (ANALPRAM HC) 2.5-1 % rectal cream   Rectal   Place 1 application rectally 3 (three) times daily.   30 g   2   . Iron Combinations (IRON COMPLEX) CAPS   Oral   Take by mouth daily.         Marland Kitchen lisinopril (PRINIVIL,ZESTRIL) 10 MG tablet   Oral   Take 10 mg by mouth daily.          . metoprolol tartrate (LOPRESSOR) 25 MG tablet   Oral   Take 12.5 mg by mouth 2 (two) times daily.         . Multiple Vitamins-Minerals (CENTRUM SILVER PO)   Oral   Take by mouth.         . Multiple Vitamins-Minerals (IMMUNE SYSTEM BOOSTER PO)   Oral   Take by mouth. Ambatrose (Immune System) 3 tabs daily         . Omega-3 Fatty Acids (FISH  OIL) 600 MG CAPS   Oral   Take 1 capsule by mouth daily.         Marland Kitchen OVER THE COUNTER MEDICATION   Oral   Take 3 tablets by mouth daily. Real coral calcium         . polyethylene glycol (MIRALAX / GLYCOLAX) packet   Oral   Take 17 g by mouth daily.         . predniSONE (DELTASONE) 5 MG tablet   Oral   Take 5 mg by mouth daily.         . Probiotic Product (PROBIOTIC DAILY PO)   Oral   Take 1 tablet by mouth daily.         . Turmeric 500 MG CAPS   Oral   Take 1 capsule by mouth daily.          . vitamin C (ASCORBIC ACID) 500 MG tablet   Oral   Take 500 mg by mouth daily.           Allergies Amoxicillin; Nsaids; and Tolmetin  Family History  Problem Relation Age of Onset  . Arthritis Mother   . Heart disease Mother   . Heart disease Father   . Heart disease Sister     CAD  . Hypertension Sister     Social History Social History  Substance Use Topics  . Smoking status: Never Smoker   . Smokeless tobacco: Never Used  . Alcohol Use: No    Review of Systems Constitutional: No fever/chills Eyes: No visual changes. ENT: No sore throat. Cardiovascular: See history of present illness  Respiratory: Denies shortness of breath except if she takes a deep breath. Gastrointestinal: No abdominal pain.  No nausea, no vomiting.  No diarrhea.  No constipation. Genitourinary: Negative for dysuria. Musculoskeletal: Negative for back pain. Skin: Negative for rash. Neurological: Negative for headaches, focal weakness or numbness.  10-point ROS otherwise negative.  ____________________________________________   PHYSICAL EXAM:  VITAL SIGNS: ED Triage Vitals  Enc Vitals Group     BP 10/22/15 2147 124/84 mmHg     Pulse Rate 10/22/15 2147 83     Resp 10/22/15 2147 16     Temp 10/22/15 2147 98.8 F (37.1 C)     Temp Source 10/22/15 2147 Oral  SpO2 10/22/15 2147 94 %     Weight --      Height --      Head Cir --      Peak Flow --      Pain Score  10/22/15 2146 8     Pain Loc --      Pain Edu? --      Excl. in [place]? --    Constitutional: Alert and oriented. Well appearing and in no acute distress. Eyes: Conjunctivae are normal. PERRL. EOMI. Head: Atraumatic. Nose: No congestion/rhinnorhea. Mouth/Throat: Mucous membranes are moist.  Oropharynx non-erythematous. Neck: No stridor.   Cardiovascular: Normal rate, regular rhythm. Grossly normal heart sounds.  Good peripheral circulation. The patient reports a sharp pain in the left lower rib cage with a deep breath. She does have some moderate tenderness along the left lateral rib cage, but no deformity or bruising is seen. No rash. Respiratory: Normal respiratory effort.  No retractions. Lungs CTAB. Gastrointestinal: Soft and nontender. No distention. Musculoskeletal: No lower extremity tenderness nor edema.  No joint effusions. Neurologic:  Normal speech and language. No gross focal neurologic deficits are appreciated. Skin:  Skin is warm, dry and intact. No rash noted. Psychiatric: Mood and affect are normal. Speech and behavior are normal.  ____________________________________________   LABS (all labs ordered are listed, but only abnormal results are displayed)  Labs Reviewed  BASIC METABOLIC PANEL - Abnormal; Notable for the following:    Glucose, Bld 122 (*)    BUN 26 (*)    Creatinine, Ser 1.31 (*)    Calcium 8.6 (*)    GFR calc non Af Amer 36 (*)    GFR calc Af Amer 42 (*)    All other components within normal limits  FIBRIN DERIVATIVES D-DIMER ([facility] ONLY) - Abnormal; Notable for the following:    Fibrin derivatives D-dimer ([facility]) 683 (*)    All other components within normal limits  CBC  TROPONIN I   ____________________________________________  EKG  Reviewed and interpreted me at 2200 Normal sinus rhythm with a heart rate is 75 QRS 80 QTc 4:30 No evidence of acute ischemic abnormality, there is evidence of probable old inferior MI by Q waves. T waves are  unremarkable. ____________________________________________  RADIOLOGY  DG Chest 2 View (Final result) Result time: 10/22/15 22:33:41   Final result by Rad Results In Interface (10/22/15 22:33:41)   Narrative:   CLINICAL DATA: Left anterior chest pain, increased with breathing after caring a laundry basket and hearing a pop.  EXAM: CHEST 2 VIEW  COMPARISON: 10/04/2011  FINDINGS: Shallow inspiration with elevation of the left hemidiaphragm. Cardiac enlargement without vascular congestion or edema. Central interstitial fibrosis in the lungs may indicate chronic bronchitis. No focal airspace disease or consolidation. No blunting of costophrenic angles. No pneumothorax. Calcified and tortuous aorta. Anterior compression of a mid thoracic vertebra, stable since previous study. Visualized ribs are nondisplaced.  IMPRESSION: No evidence of active pulmonary disease. Probable chronic bronchitic changes. Old thoracic vertebral compression deformity.   Electronically Signed By: Lucienne Capers M.D. On: 10/22/2015 22:33    ____________________________________________   PROCEDURES  Procedure(s) performed: None  Critical Care performed: No  ____________________________________________   INITIAL IMPRESSION / ASSESSMENT AND PLAN / ED COURSE  Pertinent labs & imaging results that were available during my care of the patient were reviewed by me and considered in my medical decision making (see chart for details).  The patient presents with pleuritic left-sided chest pain that seems to have occurred with  bending and musculoskeletal movement. Certainly a possibility of rib strain, rib fracture, or musculoskeletal injury is most likely based on clinical history and exam given the patient's age and the possibility of subtle pneumothorax, pulmonary embolism are certainly considered. Her EKG and her symptoms don't seem to match with an acute coronary syndrome, but we will send a  single troponin.  Control pain, obtain x-rays and will follow her closely clinically. Rule out pulmonary embolism as she is low risk. D-dimer.  ----------------------------------------- 11:54 PM on 10/22/2015 -----------------------------------------  The patient reports improvement in pain. Chest x-ray demonstrates no obvious acute change, her d-dimer is elevated however. The setting of pleuritic chest pain, no evidence of acute bony injury or obvious musculoskeletal cause though most suspect, I will obtain CT imaging to rule out pulmonary embolism or other possible chest etiologies of her acute onset pleuritic pain.  I suspect primarily musculoskeletal, and tramadol has improved her pain. Ongoing care and disposition assigned Dr. Dahlia Client. Follow-up CT angiogram of the chest, if no evidence of acute abnormality likely discharge the patient home with her tramadol as pain control for probable musculoskeletal chest pain (has Rx at home). Traditional chest pain precautions recommended. ____________________________________________   FINAL CLINICAL IMPRESSION(S) / ED DIAGNOSES  Final diagnoses:  Pleuritic pain  Musculoskeletal chest pain      Delman Kitten, MD 10/23/15 [phone]

## 2015-10-22 NOTE — ED Notes (Signed)
Pt from home via EMS , earlier today she was folding laundry and heard a "pop" in her left rib cage area. Denies any CP, c/o increased pain with breathing. Pt ambulated to EMS and to room. Pt A&O

## 2015-10-22 NOTE — ED Notes (Addendum)
Pt was folding laundry today and felt a "pop" in her left rib cage area. States it radiates to left side chest and shoulder. Denies any CP or SOB. States its painful to breathe. Pt A&O , VS stable

## 2015-10-23 ENCOUNTER — Emergency Department: Payer: Medicare Other

## 2015-10-23 DIAGNOSIS — S29001A Unspecified injury of muscle and tendon of front wall of thorax, initial encounter: Secondary | ICD-10-CM | POA: Diagnosis not present

## 2015-10-23 MED ORDER — IOHEXOL 350 MG/ML SOLN
75.0000 mL | Freq: Once | INTRAVENOUS | Status: AC | PRN
  Administered 2015-10-23: 70 mL via INTRAVENOUS

## 2015-10-23 NOTE — ED Provider Notes (Signed)
-----------------------------------------   1:10 AM on 10/23/2015 -----------------------------------------   Blood pressure 122/81, pulse 74, temperature 98.8 F (37.1 C), temperature source Oral, resp. rate 18, SpO2 93 %.  Assuming care from Dr. Jacqualine Code.  In short, [name] is a 82 y.o. female with a chief complaint of Rib Injury .  Refer to the original H&P for additional details.  The current plan of care is to follow up the result of the CT angio chest.  CT angio chest: Negative for acute pulmonary embolism, atelectatic-appearing linear basilar opacities are present bilaterally, chronic benign appearing T7 compression is unchanged from 09/01/10  The patient's CT scan does not show a PE. She will be discharged home to follow-up with her primary care physician.   Loney Hering, MD 10/23/15 [phone]

## 2015-10-23 NOTE — ED Notes (Signed)
Pt advised she was medically cleared and unable to take EMS, pt states she has no other means of transportation. MD and RN explained that insurance would not cover ride home, pt seem to misunderstand use of EMS. Charge RN notified and will handle situation

## 2015-10-23 NOTE — Discharge Instructions (Signed)
You have been seen in the Emergency Department (ED) today for chest pain.  As we have discussed todays test results are normal, and we believe your pain is due to pain/strain and/or inflammation of the muscles and/or cartilage of your chest wall. Read through the included information for additional treatment recommendations and precautions. You may use your previously prescrption for Tramadol for pain as prescribed.  Continue to take your regular medications.   Return to the Emergency Department (ED) if you experience any further chest pain/pressure/tightness, difficulty breathing, or sudden sweating, or other symptoms that concern you.   Chest Wall Pain Chest wall pain is pain in or around the bones and muscles of your chest. Sometimes, an injury causes this pain. Sometimes, the cause may not be known. This pain may take several weeks or longer to get better. HOME CARE INSTRUCTIONS  Pay attention to any changes in your symptoms. Take these actions to help with your pain:   Rest as told by your health care provider.   Avoid activities that cause pain. These include any activities that use your chest muscles or your abdominal and side muscles to lift heavy items.   If directed, apply ice to the painful area:  Put ice in a plastic bag.  Place a towel between your skin and the bag.  Leave the ice on for 20 minutes, 2-3 times per day.  Take over-the-counter and prescription medicines only as told by your health care provider.  Do not use tobacco products, including cigarettes, chewing tobacco, and e-cigarettes. If you need help quitting, ask your health care provider.  Keep all follow-up visits as told by your health care provider. This is important. SEEK MEDICAL CARE IF:  You have a fever.  Your chest pain becomes worse.  You have new symptoms. SEEK IMMEDIATE MEDICAL CARE IF:  You have nausea or vomiting.  You feel sweaty or light-headed.  You have a cough with phlegm  (sputum) or you cough up blood.  You develop shortness of breath.   This information is not intended to replace advice given to you by your health care provider. Make sure you discuss any questions you have with your health care provider.   Document Released: 07/21/2005 Document Revised: 04/11/2015 Document Reviewed: 10/16/2014 Elsevier Interactive Patient Education [employer].

## 2015-10-25 ENCOUNTER — Encounter: Payer: Self-pay | Admitting: Nurse Practitioner

## 2015-10-25 ENCOUNTER — Ambulatory Visit: Payer: BLUE CROSS/BLUE SHIELD | Admitting: Nurse Practitioner

## 2015-10-25 ENCOUNTER — Ambulatory Visit (INDEPENDENT_AMBULATORY_CARE_PROVIDER_SITE_OTHER): Payer: Medicare Other | Admitting: Nurse Practitioner

## 2015-10-25 VITALS — BP 126/84 | HR 67 | Resp 12 | Ht 65.0 in | Wt 128.6 lb

## 2015-10-25 DIAGNOSIS — S29011D Strain of muscle and tendon of front wall of thorax, subsequent encounter: Secondary | ICD-10-CM

## 2015-10-25 MED ORDER — TRAMADOL HCL 50 MG PO TABS: 50.0000 mg | ORAL_TABLET | Freq: Two times a day (BID) | ORAL | Status: DC | PRN

## 2015-10-25 NOTE — Progress Notes (Signed)
Patient ID: [name], female    DOB: 10-16-1928  Age: 82 y.o. MRN: [id number]  CC: Hospitalization Follow-up   HPI [name] presents for ED follow up for rib pain.   1) Pt was seen in the ED on 10/23/15 in the early morning CC of left chest pain  Felt a pop on the left rib area after forward bending doing laundry  Feels slightly SOB Willing to try tramadol and it was helpful   CT angio- negative for PE EKG- No acute findings  CXR- Chronic bronchitic changes, old thoracic vertebral compression deformity   History Suzanne has a past medical history of Arthritis; GERD (gastroesophageal reflux disease); Hypertension; Chronic kidney disease; Colon polyp; UTI (lower urinary tract infection); Vasculitis ([place]); Thyroid disease (1957); Renal cell carcinoma ([place]) (2003); and Hemorrhoids.   She has past surgical history that includes Abdominal hysterectomy; Bilateral salpingoophorectomy; Hernia repair; Fracture surgery (Right); Cholecystectomy (1957); Nose surgery (2000); Incontinence surgery (2006); Colonoscopy w/ biopsies (May 27, 2011,01/10/14); and Upper gi endoscopy (May 30, 2004).   Her family history includes Arthritis in her mother; Heart disease in her father, mother, and sister; Hypertension in her sister.She reports that she has never smoked. She has never used smokeless tobacco. She reports that she does not drink alcohol or use illicit drugs.  Outpatient Prescriptions Prior to Visit  Medication Sig Dispense Refill  . aspirin 81 MG tablet Take 81 mg by mouth daily.    . cholecalciferol (VITAMIN D) 1000 UNITS tablet Take 1,000 Units by mouth daily.    . ferrous sulfate 325 (65 FE) MG tablet Take 1 mg by mouth daily.     . hydrocortisone-pramoxine (ANALPRAM HC) 2.5-1 % rectal cream Place 1 application rectally 3 (three) times daily. 30 g 2  . Iron Combinations (IRON COMPLEX) CAPS Take by mouth daily.    Marland Kitchen lisinopril (PRINIVIL,ZESTRIL) 10 MG tablet Take 10 mg by mouth  daily.     . metoprolol tartrate (LOPRESSOR) 25 MG tablet Take 12.5 mg by mouth 2 (two) times daily.    . Multiple Vitamins-Minerals (IMMUNE SYSTEM BOOSTER PO) Take by mouth. Ambatrose (Immune System) 3 tabs daily    . Omega-3 Fatty Acids (FISH OIL) 600 MG CAPS Take 1 capsule by mouth daily.    Marland Kitchen OVER THE COUNTER MEDICATION Take 3 tablets by mouth daily. Real coral calcium    . polyethylene glycol (MIRALAX / GLYCOLAX) packet Take 17 g by mouth daily.    . predniSONE (DELTASONE) 5 MG tablet Take 5 mg by mouth daily.    . Probiotic Product (PROBIOTIC DAILY PO) Take 1 tablet by mouth daily.    . Turmeric 500 MG CAPS Take 1 capsule by mouth daily.     . vitamin C (ASCORBIC ACID) 500 MG tablet Take 500 mg by mouth daily.    Marland Kitchen CRANBERRY PO Take 84 mg by mouth daily. Reported on 10/25/2015    . amLODipine (NORVASC) 5 MG tablet Take 5 mg by mouth daily.     . Multiple Vitamins-Minerals (CENTRUM SILVER PO) Take by mouth.     No facility-administered medications prior to visit.    ROS Review of Systems  Constitutional: Negative for fever, chills, diaphoresis and fatigue.  Respiratory: Negative for chest tightness, shortness of breath and wheezing.   Cardiovascular: Negative for chest pain, palpitations and leg swelling.  Gastrointestinal: Negative for nausea, vomiting and diarrhea.  Skin: Negative for rash.  Neurological: Negative for dizziness, weakness, numbness and headaches.  Psychiatric/Behavioral: The patient  is not nervous/anxious.     Objective:  BP 126/84 mmHg  Pulse 67  Resp 12  Ht 5\' 5"  (1.651 m)  Wt 128 lb 9.6 oz (58.333 kg)  BMI 21.40 kg/m2  SpO2 99%  Physical Exam  Constitutional: She is oriented to person, place, and time. She appears well-developed and well-nourished. No distress.  HENT:  Head: Normocephalic and atraumatic.  Right Ear: External ear normal.  Left Ear: External ear normal.  Cardiovascular: Normal rate and regular rhythm.   Pulmonary/Chest: Effort normal  and breath sounds normal. No respiratory distress. She has no wheezes. She has no rales. She exhibits no tenderness.  Neurological: She is alert and oriented to person, place, and time. No cranial nerve deficit. She exhibits normal muscle tone. Coordination normal.  Skin: Skin is warm and dry. No rash noted. She is not diaphoretic.  Psychiatric: She has a normal mood and affect. Her behavior is normal. Judgment and thought content normal.   Assessment & Plan:   There are no diagnoses linked to this encounter. I have discontinued [name]'s Multiple Vitamins-Minerals (CENTRUM SILVER PO) and amLODipine. I am also having her start on traMADol. Additionally, I am having her maintain her predniSONE, ferrous sulfate, cholecalciferol, aspirin, CRANBERRY PO, Probiotic Product (PROBIOTIC DAILY PO), vitamin C, Fish Oil, Multiple Vitamins-Minerals (IMMUNE SYSTEM BOOSTER PO), Turmeric, OVER THE COUNTER MEDICATION, metoprolol tartrate, hydrocortisone-pramoxine, lisinopril, polyethylene glycol, and IRON COMPLEX.  Meds ordered this encounter  Medications  . traMADol (ULTRAM) 50 MG tablet    Sig: Take 1 tablet (50 mg total) by mouth 2 (two) times daily as needed.    Dispense:  30 tablet    Refill:  0    Order Specific Question:  Supervising Provider    Answer:  Crecencio Mc [[id number]]     Follow-up: No Follow-up on file.

## 2015-10-25 NOTE — Patient Instructions (Signed)

## 2015-10-28 DIAGNOSIS — S29011A Strain of muscle and tendon of front wall of thorax, initial encounter: Secondary | ICD-10-CM | POA: Insufficient documentation

## 2015-10-28 NOTE — Assessment & Plan Note (Signed)
Likely a strain of the left pectoralis since she reports left chest discomfort in this distribution- slightly improving  Tramadol script faxed to pharmacy for mod-severe pain

## 2015-11-01 ENCOUNTER — Ambulatory Visit: Payer: BLUE CROSS/BLUE SHIELD | Admitting: Nurse Practitioner

## 2015-12-20 ENCOUNTER — Encounter: Payer: Self-pay | Admitting: Family Medicine

## 2015-12-20 ENCOUNTER — Ambulatory Visit (INDEPENDENT_AMBULATORY_CARE_PROVIDER_SITE_OTHER): Payer: Medicare Other | Admitting: Family Medicine

## 2015-12-20 ENCOUNTER — Other Ambulatory Visit: Payer: Self-pay | Admitting: Family Medicine

## 2015-12-20 VITALS — BP 134/74 | HR 58 | Temp 98.2°F | Ht 65.0 in | Wt 126.2 lb

## 2015-12-20 DIAGNOSIS — J309 Allergic rhinitis, unspecified: Secondary | ICD-10-CM

## 2015-12-20 MED ORDER — MOMETASONE FUROATE 50 MCG/ACT NA SUSP: 2.0000 | Freq: Every day | NASAL | Status: DC
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
## 2015-12-20 NOTE — Patient Instructions (Signed)
Nice to meet you. We will start her on Nasonex for your allergies. Please try this for the next 2 weeks and let us know if it is beneficial. If you develop worsening symptoms or fever please let us know. We will see you back at your convenience in the next 1-2 months for her Medicare annual wellness visit.

## 2015-12-20 NOTE — Assessment & Plan Note (Signed)
Symptoms most consistent with allergic rhinitis. Benign exam. We'll start on Nasonex as Flonase is not of benefit previously. She'll continue to monitor. If not improving in the next 2 weeks she'll let us know we will likely trial Singulair as antihistamines have dried her out previously.
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
## 2015-12-21 ENCOUNTER — Telehealth: Payer: Self-pay

## 2015-12-21 NOTE — Telephone Encounter (Signed)
PA for nasonex completed on Cover my meds, approved from 11/21/2015-08/03/2098

## 2015-12-26 ENCOUNTER — Other Ambulatory Visit: Payer: Self-pay | Admitting: Family Medicine

## 2015-12-26 DIAGNOSIS — Z1231 Encounter for screening mammogram for malignant neoplasm of breast: Secondary | ICD-10-CM

## 2016-01-21 ENCOUNTER — Ambulatory Visit (INDEPENDENT_AMBULATORY_CARE_PROVIDER_SITE_OTHER): Payer: Medicare Other

## 2016-01-21 VITALS — BP 128/82 | HR 60 | Temp 97.1°F | Resp 14 | Ht 61.0 in | Wt 125.1 lb

## 2016-01-21 DIAGNOSIS — Z0000 Encounter for general adult medical examination without abnormal findings: Secondary | ICD-10-CM | POA: Diagnosis not present

## 2016-01-21 NOTE — Patient Instructions (Addendum)
  [name] , Thank you for taking time to come for your Medicare Wellness Visit. I appreciate your ongoing commitment to your health goals. Please review the following plan we discussed and let me know if I can assist you in the future.   Follow up with Dr. Caryl Bis as needed.   This is a list of the screening recommended for you and due dates:  Health Maintenance  Topic Date Due  . Pneumonia vaccines (2 of 2 - PCV13) 08/04/2012  . Flu Shot  07/03/2016*  . DEXA scan (bone density measurement)  01/20/2017*  . Shingles Vaccine  01/20/2017*  . Colon Cancer Screening  01/16/2017  . Tetanus Vaccine  08/04/2020  *Topic was postponed. The date shown is not the original due date.

## 2016-01-21 NOTE — Progress Notes (Signed)
Subjective:   [name] is a 82 y.o. female who presents for Medicare Annual (Subsequent) preventive examination.  Review of Systems:  No ROS.  Medicare Wellness Visit.  Cardiac Risk Factors include: advanced age (>59men, >72 women)     Objective:     Vitals: BP 128/82 mmHg  Pulse 60  Temp(Src) 97.1 F (36.2 C) (Oral)  Resp 14  Ht 5\' 1"  (1.549 m)  Wt 125 lb 1.9 oz (56.754 kg)  BMI 23.65 kg/m2  SpO2 99%  Body mass index is 23.65 kg/(m^2).   Tobacco History  Smoking status  . Never Smoker   Smokeless tobacco  . Never Used     Counseling given: Not Answered   Past Medical History  Diagnosis Date  . Arthritis   . GERD (gastroesophageal reflux disease)   . Hypertension   . Chronic kidney disease   . Colon polyp   . UTI (lower urinary tract infection)   . 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 Thyroid disease 1957    hyperthyroid, had radioactiveiodine treatment   . Renal cell carcinoma ([place]) 2003    s/p sgy for removal right kidney  . Hemorrhoids    Past Surgical History  Procedure Laterality Date  . Abdominal hysterectomy    . Bilateral salpingoophorectomy    . Hernia repair    . Fracture surgery Right     femur fracture  . Cholecystectomy  1957  . Nose surgery  2000  . Incontinence surgery  2006  . Colonoscopy w/ biopsies  May 27, 2011,01/10/14    tubular adenoma in the ascending colon and descending colon. Tubulovillous adenoma in the upper rectum 12 mm. Diverticulosis.  Marland Kitchen Upper gi endoscopy  May 30, 2004    large hiatal hernia, duodenal diverticulum.   Family History  Problem Relation Age of Onset  . Arthritis Mother   . Heart disease Mother   . Heart disease Father   . Heart disease Sister     CAD  . Hypertension Sister    History  Sexual Activity  . Sexual Activity: No    Outpatient Encounter Prescriptions as of 01/21/2016  Medication Sig  . cholecalciferol (VITAMIN D) 1000 UNITS tablet Take 1,000  Units by mouth daily.  . ferrous sulfate 325 (65 FE) MG tablet Take 1 mg by mouth daily.   . hydrocortisone-pramoxine (ANALPRAM HC) 2.5-1 % rectal cream Place 1 application rectally 3 (three) times daily.  Marland Kitchen lisinopril (PRINIVIL,ZESTRIL) 10 MG tablet Take 10 mg by mouth daily.   . metoprolol tartrate (LOPRESSOR) 25 MG tablet Take 12.5 mg by mouth 2 (two) times daily.  . mometasone (NASONEX) 50 MCG/ACT nasal spray PLACE 2 SPRAYS INTO THE NOSE DAILY  . Multiple Vitamins-Minerals (IMMUNE SYSTEM BOOSTER PO) Take by mouth. Ambatrose (Immune System) 3 tabs daily  . polyethylene glycol (MIRALAX / GLYCOLAX) packet Take 17 g by mouth daily.  . predniSONE (DELTASONE) 5 MG tablet Take 5 mg by mouth daily.  . Probiotic Product (PROBIOTIC DAILY PO) Take 1 tablet by mouth daily.  . traMADol (ULTRAM) 50 MG tablet Take 1 tablet (50 mg total) by mouth 2 (two) times daily as needed.  . Turmeric 500 MG CAPS Take 1 capsule by mouth daily.   . vitamin C (ASCORBIC ACID) 500 MG tablet Take 500 mg by mouth daily.  . [DISCONTINUED] Iron Combinations (IRON COMPLEX) CAPS Take by mouth daily.  Marland Kitchen acetaminophen (RA ACETAMINOPHEN) 650 MG CR tablet Take by mouth.  . [  DISCONTINUED] aspirin 81 MG tablet Take 81 mg by mouth daily.   No facility-administered encounter medications on file as of 01/21/2016.    Activities of Daily Living In your present state of health, do you have any difficulty performing the following activities: 01/21/2016  Hearing? Y  Vision? N  Difficulty concentrating or making decisions? N  Walking or climbing stairs? N  Dressing or bathing? N  Doing errands, shopping? N  Preparing Food and eating ? N  Using the Toilet? N  In the past six months, have you accidently leaked urine? N  Do you have problems with loss of bowel control? N  Managing your Medications? N  Managing your Finances? N  Housekeeping or managing your Housekeeping? N    Patient Care Team: Leone Haven, MD as PCP - General  (Family Medicine) Robert Bellow, MD (General Surgery) Crecencio Mc, MD (Internal Medicine)    Assessment:   This is a routine wellness examination for [name]. The goal of the wellness visit is to assist the patient how to close the gaps in care and create a preventative care plan for the patient.   Taking VIT D calcium as appropriate/Osteoporosis reviewed.  DEXA Scan postponed, per patient request.  Educational material provided.   Medications reviewed; taking without issues or barriers.  Safety issues reviewed; smoke detectors in the home. No firearms in the home. Wears seatbelts when driving or riding with others. No violence in the home.  No identified risk were noted; The patient was oriented x 3; appropriate in dress and manner and no objective failures at ADL's or IADL's.   Chronic kidney disease; Stable and followed by Urology, [facility].  See care everywhere.  Prevnar 13 vaccine postponed, per patient request.  Follow up with PCP.  Patient Concerns:  None at this time.  Follow up with PCP.    Exercise Activities and Dietary recommendations Current Exercise Habits: Home exercise routine, Type of exercise: walking, Time (Minutes): 30, Frequency (Times/Week): 3, Weekly Exercise (Minutes/Week): 90, Intensity: Mild  Goals    . Healthy Lifestyle     Stay hydrated and drink plenty of fluids. Stay active and continue to walk for exercise. Healthy diet.      Fall Risk Fall Risk  01/21/2016 12/20/2015 09/15/2014 09/01/2013  Falls in the past year? No No No No  Risk for fall due to : - - - History of fall(s)   Depression Screen PHQ 2/9 Scores 01/21/2016 12/20/2015 09/15/2014 09/01/2013  PHQ - 2 Score 0 0 0 0     Cognitive Testing MMSE - Mini Mental State Exam 01/21/2016  Orientation to time 5  Orientation to Place 5  Registration 3  Attention/ Calculation 5  Recall 3  Language- name 2 objects 2  Language- repeat 1  Language- follow 3 step command 3  Language- read &  follow direction 1  Write a sentence 1  Copy design 1  Total score 30    Immunization History  Administered Date(s) Administered  . Influenza Split 03/04/2013, 03/04/2014  . Pneumococcal Polysaccharide-23 08/05/2011  . Tdap 08/04/2010   Screening Tests Health Maintenance  Topic Date Due  . PNA vac Low Risk Adult (2 of 2 - PCV13) 08/04/2012  . INFLUENZA VACCINE  07/03/2016 (Originally 03/04/2016)  . DEXA SCAN  01/20/2017 (Originally 03/24/1996)  . ZOSTAVAX  01/20/2017 (Originally 03/25/1991)  . COLONOSCOPY  01/16/2017  . TETANUS/TDAP  08/04/2020      Plan:   End of life planning; Advance aging; Advanced  directives discussed. Copy of current HCPOA/Living Will requested.   During the course of the visit the patient was educated and counseled about the following appropriate screening and preventive services:   Vaccines to include Pneumoccal, Influenza, Hepatitis B, Td, Zostavax, HCV  Electrocardiogram  Cardiovascular Disease  Colorectal cancer screening  Bone density screening  Diabetes screening  Glaucoma screening  Mammography/PAP  Nutrition counseling   Patient Instructions (the written plan) was given to the patient.   Varney Biles, LPN  [phone]

## 2016-01-30 ENCOUNTER — Encounter: Payer: Medicare Other | Attending: Internal Medicine | Admitting: Internal Medicine

## 2016-01-30 ENCOUNTER — Telehealth: Payer: Self-pay | Admitting: Cardiovascular Disease

## 2016-01-30 DIAGNOSIS — I70232 Atherosclerosis of native arteries of right leg with ulceration of calf: Secondary | ICD-10-CM | POA: Insufficient documentation

## 2016-01-30 DIAGNOSIS — I10 Essential (primary) hypertension: Secondary | ICD-10-CM | POA: Diagnosis not present

## 2016-01-30 DIAGNOSIS — I87331 Chronic venous hypertension (idiopathic) with ulcer and inflammation of right lower extremity: Secondary | ICD-10-CM | POA: Diagnosis not present

## 2016-01-30 DIAGNOSIS — L97211 Non-pressure chronic ulcer of right calf limited to breakdown of skin: Secondary | ICD-10-CM | POA: Insufficient documentation

## 2016-01-30 DIAGNOSIS — K5909 Other constipation: Secondary | ICD-10-CM | POA: Diagnosis not present

## 2016-01-30 DIAGNOSIS — K219 Gastro-esophageal reflux disease without esophagitis: Secondary | ICD-10-CM | POA: Insufficient documentation

## 2016-01-30 DIAGNOSIS — I776 Arteritis, unspecified: Secondary | ICD-10-CM | POA: Insufficient documentation

## 2016-01-30 DIAGNOSIS — Z880 Allergy status to penicillin: Secondary | ICD-10-CM | POA: Diagnosis not present

## 2016-01-30 DIAGNOSIS — Z85528 Personal history of other malignant neoplasm of kidney: Secondary | ICD-10-CM | POA: Diagnosis not present

## 2016-01-30 DIAGNOSIS — K449 Diaphragmatic hernia without obstruction or gangrene: Secondary | ICD-10-CM | POA: Insufficient documentation

## 2016-01-30 NOTE — Telephone Encounter (Signed)
lmov for patient to call back and schedule LE ART and follow up with Dr Fletcher Anon (new patient)

## 2016-01-31 ENCOUNTER — Other Ambulatory Visit: Payer: Self-pay | Admitting: Internal Medicine

## 2016-01-31 DIAGNOSIS — S81801D Unspecified open wound, right lower leg, subsequent encounter: Secondary | ICD-10-CM

## 2016-02-01 ENCOUNTER — Ambulatory Visit
Admission: RE | Admit: 2016-02-01 | Discharge: 2016-02-01 | Disposition: A | Discharge status: Home / Self Care | Payer: Medicare Other | Source: Ambulatory Visit | Attending: Family Medicine | Admitting: Family Medicine

## 2016-02-01 ENCOUNTER — Other Ambulatory Visit: Payer: Self-pay | Admitting: Family Medicine

## 2016-02-01 DIAGNOSIS — Z1231 Encounter for screening mammogram for malignant neoplasm of breast: Secondary | ICD-10-CM

## 2016-02-01 NOTE — Progress Notes (Signed)
KESI, BRANK ([id number]) Visit Report for 01/30/2016 Abuse/Suicide Risk Screen Details Patient Name: [name], [name]. Date of Service: 01/30/2016 8:00 AM Medical Record Patient Account Number: [id number] Number: Treating RN: Cornell Barman 1929-04-14 (82 y.o. Other Clinician: Date of Birth/Sex: Female) Treating ROBSON, MICHAEL Primary Care Physician/Extender: Carolan Shiver, ERIC Physician: Referring Physician: DOSS, CARRIE Weeks in Treatment: 0 Abuse/Suicide Risk Screen Items Answer ABUSE/SUICIDE RISK SCREEN: Has anyone close to you tried to hurt or harm you recentlyo No Do you feel uncomfortable with anyone in your familyo No Has anyone forced you do things that you didnot want to doo No Do you have any thoughts of harming yourselfo No Patient displays signs or symptoms of abuse and/or neglect. No Electronic Signature(s) Signed: 01/31/2016 3:00:23 PM By: Gretta Cool, RN, BSN, Kim RN, BSN Entered By: Gretta Cool, RN, BSN, Kim on 01/30/2016 08:25:32 Liberto, [name] ([id number]) -------------------------------------------------------------------------------- Activities of Daily Living Details Patient Name: [name] Date of Service: 01/30/2016 8:00 AM Medical Record Patient Account Number: [id number] Number: Treating RN: Cornell Barman 28-Feb-1929 (82 y.o. Other Clinician: Date of Birth/Sex: Female) Treating ROBSON, MICHAEL Primary Care Physician/Extender: Carolan Shiver, ERIC Physician: Referring Physician: Flonnie Overman, CARRIE Weeks in Treatment: 0 Activities of Daily Living Items Answer Activities of Daily Living (Please select one for each item) Drive Automobile Completely Able Take Medications Completely Able Use Telephone Completely Able Care for Appearance Completely Able Use Toilet Completely Able Bath / Shower Completely Able Dress Self Completely Able Feed Self Completely Able Walk Completely Able Get In / Out Bed Completely Able Housework Completely  Able Prepare Meals Completely [place] for Self Completely Able Electronic Signature(s) Signed: 01/31/2016 3:00:23 PM By: Gretta Cool, RN, BSN, Kim RN, BSN Entered By: Gretta Cool, RN, BSN, Kim on 01/30/2016 08:25:42 [place]ley, [name] ([id number]) -------------------------------------------------------------------------------- Education Assessment Details Patient Name: [name] Date of Service: 01/30/2016 8:00 AM Medical Record Patient Account Number: [id number] Number: Treating RN: Cornell Barman 01/23/1929 (82 y.o. Other Clinician: Date of Birth/Sex: Female) Treating ROBSON, MICHAEL Primary Care Physician/Extender: Carolan Shiver, ERIC Physician: Referring Physician: Carmine Savoy in Treatment: 0 Primary Learner Assessed: Patient Learning Preferences/Education Level/Primary Language Learning Preference: Explanation, Demonstration Highest Education Level: College or Above Preferred Language: English Cognitive Barrier Assessment/Beliefs Language Barrier: No Translator Needed: No Memory Deficit: No Emotional Barrier: No Cultural/Religious Beliefs Affecting Medical No Care: Physical Barrier Assessment Impaired Vision: Yes Glasses Impaired Hearing: No Decreased Hand dexterity: No Knowledge/Comprehension Assessment Knowledge Level: High Comprehension Level: High Ability to understand written High instructions: Ability to understand verbal High instructions: Motivation Assessment Anxiety Level: Calm Cooperation: Cooperative Education Importance: Acknowledges Need Interest in Health Problems: Asks Questions Perception: Coherent Willingness to Engage in Self- High Management Activities: High [place], [place]. ([id number]) Readiness to Engage in Self- Management Activities: Electronic Signature(s) Signed: 01/31/2016 3:00:23 PM By: Gretta Cool, RN, BSN, Kim RN, BSN Entered By: Gretta Cool, RN, BSN, Kim on 01/30/2016 08:26:25 [place], [name]  ([id number]) -------------------------------------------------------------------------------- Fall Risk Assessment Details Patient Name: [name] Date of Service: 01/30/2016 8:00 AM Medical Record Patient Account Number: [id number] Number: Treating RN: Cornell Barman October 20, 1928 (82 y.o. Other Clinician: Date of Birth/Sex: Female) Treating ROBSON, MICHAEL Primary Care Physician/Extender: Carolan Shiver, ERIC Physician: Referring Physician: Flonnie Overman, CARRIE Weeks in Treatment: 0 Fall Risk Assessment Items Have you had 2 or more falls in the last 12 monthso 0 No Have you had any fall that resulted in injury in the last 12 monthso 0 No FALL RISK ASSESSMENT: History of  falling - immediate or within 3 months 0 No Secondary diagnosis 0 No Ambulatory aid None/bed rest/wheelchair/nurse 0 Yes Crutches/cane/walker 0 No Furniture 0 No IV Access/Saline Lock 0 No Gait/Training Normal/bed rest/immobile 0 Yes Weak 0 No Impaired 0 No Mental Status Oriented to own ability 0 Yes Electronic Signature(s) Signed: 01/31/2016 3:00:23 PM By: Gretta Cool, RN, BSN, Kim RN, BSN Entered By: Gretta Cool, RN, BSN, Kim on 01/30/2016 08:26:42 Nuz[place], [name] ([id number]) -------------------------------------------------------------------------------- Foot Assessment Details Patient Name: [name] Date of Service: 01/30/2016 8:00 AM Medical Record Patient Account Number: [id number] Number: Treating RN: Cornell Barman 1929-05-06 (82 y.o. Other Clinician: Date of Birth/Sex: Female) Treating ROBSON, MICHAEL Primary Care Physician/Extender: Carolan Shiver, ERIC Physician: Referring Physician: DOSS, CARRIE Weeks in Treatment: 0 Foot Assessment Items Site Locations + = Sensation present, - = Sensation absent, C = Callus, U = Ulcer R = Redness, W = Warmth, M = Maceration, PU = Pre-ulcerative lesion F = Fissure, S = Swelling, D = Dryness Assessment Right: Left: Other Deformity: No No Prior  Foot Ulcer: No No Prior Amputation: No No Charcot Joint: No No Ambulatory Status: Ambulatory Without Help Gait: Steady Electronic Signature(s) Signed: 01/31/2016 3:00:23 PM By: Gretta Cool, RN, BSN, Kim RN, BSN Entered By: Gretta Cool, RN, BSN, Kim on 01/30/2016 08:27:00 Goeser, [name] ([id number]) F[place]lesong, [name] ([id number]) -------------------------------------------------------------------------------- Nutrition Risk Assessment Details Patient Name: [name] Date of Service: 01/30/2016 8:00 AM Medical Record Patient Account Number: [id number] Number: Treating RN: Cornell Barman 07-Oct-1928 (82 y.o. Other Clinician: Date of Birth/Sex: Female) Treating ROBSON, MICHAEL Primary Care Physician/Extender: Carolan Shiver, ERIC Physician: Referring Physician: DOSS, CARRIE Weeks in Treatment: 0 Height (in): 64 Weight (lbs): 128 Body Mass Index (BMI): 22 Nutrition Risk Assessment Items NUTRITION RISK SCREEN: I have an illness or condition that made me change the kind and/or 0 No amount of food I eat I eat fewer than two meals per day 0 No I eat few fruits and vegetables, or milk products 0 No I have three or more drinks of beer, liquor or wine almost every day 0 No I have tooth or mouth problems that make it hard for me to eat 0 No I don't always have enough money to buy the food I need 0 No I eat alone most of the time 0 No I take three or more different prescribed or over-the-counter drugs a 0 No day Without wanting to, I have lost or gained 10 pounds in the last six 0 No months I am not always physically able to shop, cook and/or feed myself 0 No Nutrition Protocols Good Risk Protocol 0 No interventions needed Moderate Risk Protocol Electronic Signature(s) Signed: 01/31/2016 3:00:23 PM By: Gretta Cool, RN, BSN, Kim RN, BSN Entered By: Gretta Cool, RN, BSN, Kim on 01/30/2016 [id number]

## 2016-02-01 NOTE — Progress Notes (Signed)
ERICK, OCHELTREE ([id number]) Visit Report for 01/30/2016 Chief Complaint Document Details Patient Name: [name], [name]. Date of Service: 01/30/2016 8:00 AM Medical Record Patient Account Number: [id number] Number: Treating RN: Cornell Barman 1929-04-16 (82 y.o. Other Clinician: Date of Birth/Sex: Female) Treating ROBSON, MICHAEL Primary Care Physician/Extender: Carolan Shiver, ERIC Physician: Referring Physician: Flonnie Overman, CARRIE Weeks in Treatment: 0 Information Obtained from: Patient Chief Complaint Right calf traumatic ulceration. Right hand skin tear (healed). 01/30/16; patient returns to clinic with a wound on her right anterior leg that is been present for a month. Electronic Signature(s) Signed: 01/30/2016 4:24:59 PM By: Linton Ham MD Entered By: Linton Ham on 01/30/2016 08:53:47 Heine, [name] ([id number]) -------------------------------------------------------------------------------- Debridement Details Patient Name: [name] Date of Service: 01/30/2016 8:00 AM Medical Record Patient Account Number: [id number] Number: Treating RN: Cornell Barman [birth year] (82 y.o. Other Clinician: Date of Birth/Sex: Female) Treating ROBSON, MICHAEL Primary Care Physician/Extender: Carolan Shiver, ERIC Physician: Referring Physician: Flonnie Overman, CARRIE Weeks in Treatment: 0 Debridement Performed for Wound #6 Right,Medial Lower Leg Assessment: Performed By: Physician Ricard Dillon, MD Debridement: Debridement Pre-procedure Yes Verification/Time Out Taken: Start Time: 08:40 Pain Control: Other : lidocaine 4% Level: Skin/Subcutaneous Tissue Total Area Debrided (L x 2.2 (cm) x 2.5 (cm) = 5.5 (cm) W): Tissue and other Viable, Non-Viable, Eschar, Exudate, Fibrin/Slough, Subcutaneous material debrided: Instrument: Curette Bleeding: Minimum Hemostasis Achieved: Pressure End Time: 08:45 Procedural Pain: 0 Post Procedural Pain: 0 Response to Treatment:  Procedure was tolerated well Post Debridement Measurements of Total Wound Length: (cm) 2.2 Width: (cm) 2.5 Depth: (cm) 0.1 Volume: (cm) 0.432 Post Procedure Diagnosis Same as Pre-procedure Electronic Signature(s) Signed: 01/30/2016 4:24:59 PM By: Linton Ham MD Signed: 01/31/2016 3:00:23 PM By: Gretta Cool, RN, BSN, Kim RN, BSN Entered By: Linton Ham on 01/30/2016 08:52:40 Fry, [name] ([id number]) Axelrod, [name] ([id number]) -------------------------------------------------------------------------------- HPI Details Patient Name: [name] Date of Service: 01/30/2016 8:00 AM Medical Record Patient Account Number: [id number] Number: Treating RN: Cornell Barman 1929-01-09 (82 y.o. Other Clinician: Date of Birth/Sex: Female) Treating ROBSON, MICHAEL Primary Care Physician/Extender: Carolan Shiver, ERIC Physician: Referring Physician: DOSS, CARRIE Weeks in Treatment: 0 History of Present Illness HPI Description: Pleasant 82 year old with history of Arthritis;GERD (gastroesophageal reflux disease);Hypertension;Chronic kidney diseaseo;Vasculitis, ANCA positive, pulmonary hemorrhage;Thyroid disease;hyperthyroid, had radioactiveiodine treatment;Renal cell carcinoma 2003 s/p sgy for removal right kidney;Hemorrhoids.o She has also had several surgeries including abdominal hysterectomy with bilateral salpingo-oophorectomy, hernia repair, fracture of her femur on the right side, cholecystectomy, cardiac catheterization. She traumatized her right calf at 2 locations in November 2016. More recently fell and suffered a skin tear to her right hand (healed). Ambulating per her baseline. No significant pain. Right ABI 0.75. Performing dressing changes with silver alginate and using a Tubigrip for edema control. She returns to clinic for follow-up and is without complaints other than the silver alginate sticking to her wounds. No significant pain. No fever or chills. Minimal  drainage. 01/30/16; this is an 82 year old woman who was previously been in this clinic on 2 occasions. In June 2016 she had a left leg wound and a right forearm wound. The lower extremity wound I think was caused by hitting her leg on the dishwasher door and the arm on the car door. She healed in 2-3 weeks with standard therapy. She was here in December 2016 with her traumatic wound to the right calf again this healed fairly quickly. She has an ABI on the right leg "it is 0.75. Today she is noncompressible. She  does have some form of stocking but I don't think wears them reliably in I don't think there prescription stockings area she has a history of an Anka-positive vasculitis and is on prednisone. Her past medical history is reviewed. She does not complain of anything that sounds like claudication still an active woman who lives in an independent living setting about a month ago she tells me she hit her right anterior leg on a box. This caused a open wound. She is been treating this with Neosporin without improvement. She is not a diabetic. Her arterial ABI in this clinic was noncompressible the patient is been seen in this clinic 2 times before. However I was not involved in her care nor was any of the current physician group in this clinic Electronic Signature(s) Signed: 01/30/2016 4:24:59 PM By: Linton Ham MD Entered By: Linton Ham on 01/30/2016 09:04:21 Horsford, [name] ([id number]) -------------------------------------------------------------------------------- Physical Exam Details Patient Name: [name] Date of Service: 01/30/2016 8:00 AM Medical Record Patient Account Number: [id number] Number: Treating RN: Cornell Barman [birth year] (82 y.o. Other Clinician: Date of Birth/Sex: Female) Treating ROBSON, MICHAEL Primary Care Physician/Extender: Carolan Shiver, ERIC Physician: Referring Physician: DOSS, CARRIE Weeks in Treatment: 0 Constitutional Sitting  or standing Blood Pressure is within target range for patient.. Pulse regular and within target range for patient.Marland Kitchen Respirations regular, non-labored and within target range.. Temperature is normal and within the target range for the patient.. Patient's appearance is neat and clean. Appears in no acute distress. Well nourished and well developed.. Cardiovascular 2/6 systolic ejection murmur that does not radiate in the aortic area.. She has a normal femoral artery pulses as well as popliteal pulses bilaterally.. Pedal pulses absent bilaterally. Both her dorsalis pedis and posterior tibial pulses are absent on the right side but easily palpable on the left. Edema present in both extremities. Her edema is mild. She has chronic hemosiderin deposition worse on the right anterior leg than the left suggestive of chronic venous insufficiency. Gastrointestinal (GI) Abdomen is soft and non-distended without masses or tenderness. Bowel sounds active in all quadrants.. Lymphatic Nonpalpable in the popliteal or inguinal areas. Psychiatric No evidence of depression, anxiety, or agitation. Calm, cooperative, and communicative. Appropriate interactions and affect.. Notes Wound exam; the areas on her right anterior leg give. This was covered with surface slough and nonviable subcutaneous tissue which was removed with a curet. Post debridement this actually looks quite healthy there is no evidence of infection. As noted above and noted by her intake nurse at. Her pedal pulses on the right are not palpable. She also has a delayed capillary refill time on the right. On the left all of this seems quite normal. As mentioned above also she has multiple right femoral and popliteal pulses. I wonder if there is some form of distal stenosis although she does not give a history suggestive of claudication. She also has a history of vasculitis although this is supposedly in remission Electronic Signature(s) Signed:  01/30/2016 4:24:59 PM By: Linton Ham MD Entered By: Linton Ham on 01/30/2016 08:59:59 Kolton, [name] ([id number]) -------------------------------------------------------------------------------- Physician Orders Details Patient Name: [name] Date of Service: 01/30/2016 8:00 AM Medical Record Patient Account Number: [id number] Number: Treating RN: Cornell Barman October 03, 1928 (82 y.o. Other Clinician: Date of Birth/Sex: Female) Treating ROBSON, MICHAEL Primary Care Physician/Extender: Carolan Shiver, ERIC Physician: Referring Physician: Carmine Savoy in Treatment: 0 Verbal / Phone Orders: Yes Clinician: Cornell Barman Read Back and Verified: Yes Diagnosis Coding Wound Cleansing Wound #  6 Right,Medial Lower Leg o Clean wound with Normal Saline. Anesthetic Wound #6 Right,Medial Lower Leg o Topical Lidocaine 4% cream applied to wound bed prior to debridement Primary Wound Dressing Wound #6 Right,Medial Lower Leg o Prisma Ag Secondary Dressing Wound #6 Right,Medial Lower Leg o Boardered Foam Dressing Dressing Change Frequency Wound #6 Right,Medial Lower Leg o Other: - patient to change once weekly (Sunday) Follow-up Appointments Wound #6 Right,Medial Lower Leg o Return Appointment in 1 week. Edema Control Wound #6 Right,Medial Lower Leg o Elevate legs to the level of the heart and pump ankles as often as possible Additional Orders / Instructions Wound #6 Right,Medial Lower Leg o Increase protein intake. KATHLEENE, BASILONE ([id number]) Consults o Vascular - Arterial Studies - Right Lower Leg Electronic Signature(s) Signed: 01/30/2016 4:24:59 PM By: Linton Ham MD Signed: 01/31/2016 3:00:23 PM By: Gretta Cool RN, BSN, Kim RN, BSN Entered By: Gretta Cool, RN, BSN, Kim on 01/30/2016 09:02:08 Pizzo, [name] ([id number]) -------------------------------------------------------------------------------- Problem List Details Patient Name: [name] Date of Service: 01/30/2016 8:00 AM Medical Record Patient Account Number: [id number] Number: Treating RN: Cornell Barman 10-May-1929 (82 y.o. Other Clinician: Date of Birth/Sex: Female) Treating ROBSON, MICHAEL Primary Care Physician/Extender: Carolan Shiver, ERIC Physician: Referring Physician: Carmine Savoy in Treatment: 0 Active Problems ICD-10 Encounter Code Description Active Date Diagnosis L97.211 Non-pressure chronic ulcer of right calf limited to 01/30/2016 Yes breakdown of skin I87.331 Chronic venous hypertension (idiopathic) with ulcer and 01/30/2016 Yes inflammation of right lower extremity I70.232 Atherosclerosis of native arteries of right leg with 01/30/2016 Yes ulceration of calf Inactive Problems Resolved Problems Electronic Signature(s) Signed: 01/30/2016 4:24:59 PM By: Linton Ham MD Entered By: Linton Ham on 01/30/2016 08:52:19 Lorey, [name] ([id number]) -------------------------------------------------------------------------------- Progress Note/History and Physical Details Patient Name: [name] Date of Service: 01/30/2016 8:00 AM Medical Record Patient Account Number: [id number] Number: Treating RN: Cornell Barman Jun 27, 1929 (82 y.o. Other Clinician: Date of Birth/Sex: Female) Treating ROBSON, MICHAEL Primary Care Physician/Extender: Carolan Shiver, ERIC Physician: Referring Physician: Lorane Gell Weeks in Treatment: 0 Subjective Chief Complaint Information obtained from Patient Right calf traumatic ulceration. Right hand skin tear (healed). 01/30/16; patient returns to clinic with a wound on her right anterior leg that is been present for a month. History of Present Illness (HPI) Pleasant 82 year old with history of Arthritis;GERD (gastroesophageal reflux disease);Hypertension;Chronic kidney disease ;Vasculitis, ANCA positive, pulmonary hemorrhage;Thyroid disease;hyperthyroid, had radioactiveiodine  treatment;Renal cell carcinoma 2003 s/p sgy for removal right kidney;Hemorrhoids. She has also had several surgeries including abdominal hysterectomy with bilateral salpingo-oophorectomy, hernia repair, fracture of her femur on the right side, cholecystectomy, cardiac catheterization. She traumatized her right calf at 2 locations in November 2016. More recently fell and suffered a skin tear to her right hand (healed). Ambulating per her baseline. No significant pain. Right ABI 0.75. Performing dressing changes with silver alginate and using a Tubigrip for edema control. She returns to clinic for follow-up and is without complaints other than the silver alginate sticking to her wounds. No significant pain. No fever or chills. Minimal drainage. 01/30/16; this is an 82 year old woman who was previously been in this clinic on 2 occasions. In June 2016 she had a left leg wound and a right forearm wound. The lower extremity wound I think was caused by hitting her leg on the dishwasher door and the arm on the car door. She healed in 2-3 weeks with standard therapy. She was here in December 2016 with her traumatic wound to the right calf again this  healed fairly quickly. She has an ABI on the right leg "it is 0.75. Today she is noncompressible. She does have some form of stocking but I don't think wears them reliably in I don't think there prescription stockings area she has a history of an Anka-positive vasculitis and is on prednisone. Her past medical history is reviewed. She does not complain of anything that sounds like claudication still an active woman who lives in an independent living setting about a month ago she tells me she hit her right anterior leg on a box of. This caused a open wound. She is been treating this with Neosporin without improvement. She is not a diabetic. Her arterial ABI in this clinic was noncompressible. Wound History Patient presents with 1 open wound that has been  present for approximately 1 month. Patient has been Mcgurn, Gulianna W. ([id number]) treating wound in the following manner: neosporin; triple abx ointment. Laboratory tests have not been performed in the last month. Patient reportedly has not tested positive for an antibiotic resistant organism. Patient reportedly has not tested positive for osteomyelitis. Patient reportedly has not had testing performed to evaluate circulation in the legs. Patient History Information obtained from Patient. Allergies amoxicillin (Reaction: itching), NSAIDS (Non-Steroidal Anti-Inflammatory Drug), tolmetin Family History Heart Disease - Father, No family history of Cancer, Diabetes, Hereditary Spherocytosis, Hypertension, Kidney Disease, Lung Disease, Seizures, Stroke, Thyroid Problems, Tuberculosis. Social History Never smoker, Marital Status - Widowed, Alcohol Use - Never, Drug Use - No History, Caffeine Use - Daily. Medical History Eyes Patient has history of Cataracts - removed (B) Denies history of Glaucoma, Optic Neuritis Ear/Nose/Mouth/Throat Denies history of Chronic sinus problems/congestion, Middle ear problems Hematologic/Lymphatic Denies history of Anemia, Hemophilia, Human Immunodeficiency Virus, Lymphedema, Sickle Cell Disease Respiratory Denies history of Aspiration, Asthma, Chronic Obstructive Pulmonary Disease (COPD), Pneumothorax, Sleep Apnea, Tuberculosis Cardiovascular Patient has history of Hypertension, Vasculitis Denies history of Angina, Arrhythmia, Congestive Heart Failure, Coronary Artery Disease, Deep Vein Thrombosis, Hypotension, Myocardial Infarction, Peripheral Arterial Disease, Peripheral Venous Disease, Phlebitis Gastrointestinal Denies history of Cirrhosis , Colitis, Crohn s, Hepatitis A, Hepatitis B, Hepatitis C Endocrine Denies history of Type I Diabetes, Type II Diabetes Immunological Denies history of Lupus Erythematosus, Raynaud s, Scleroderma Integumentary  (Skin) Denies history of History of Burn, History of pressure wounds Musculoskeletal Patient has history of Osteoarthritis Denies history of Gout, Rheumatoid Arthritis, Osteomyelitis Neurologic Denies history of Dementia, Neuropathy, Quadriplegia, Paraplegia, Seizure Disorder Oncologic Denies history of Received Chemotherapy, Received Radiation Findling, [name] ([id number]) Psychiatric Denies history of Anorexia/bulimia, Confinement Anxiety Hospitalization/Surgery History - 08/04/1998, [facility], Partial (L) kidney removal- [place]. Medical And Surgical History Notes Constitutional Symptoms (General Health) HTN; vasculitis (in remission); tumor removed from kidney (cancer) Gastrointestinal hiatal hernia; chronic constipation Genitourinary Kidney CA- (L) partial removal Immunological compromised immune system Oncologic Kidney CA (L) Review of Systems (ROS) Constitutional Symptoms (General Health) The patient has no complaints or symptoms. Eyes The patient has no complaints or symptoms. Ear/Nose/Mouth/Throat The patient has no complaints or symptoms. Hematologic/Lymphatic The patient has no complaints or symptoms. Respiratory The patient has no complaints or symptoms. Cardiovascular The patient has no complaints or symptoms. Gastrointestinal The patient has no complaints or symptoms. Endocrine The patient has no complaints or symptoms. Genitourinary The patient has no complaints or symptoms. Immunological The patient has no complaints or symptoms. Integumentary (Skin) Complains or has symptoms of Wounds, Bleeding or bruising tendency. Denies complaints or symptoms of Breakdown, Swelling. Musculoskeletal The patient has no complaints or symptoms. Neurologic The patient has no  complaints or symptoms. Oncologic The patient has no complaints or symptoms. Psychiatric The patient has no complaints or symptoms. Hapke, Matelyn W. ([id number]) Objective Constitutional Sitting or  standing Blood Pressure is within target range for patient.. Pulse regular and within target range for patient.Marland Kitchen Respirations regular, non-labored and within target range.. Temperature is normal and within the target range for the patient.. Patient's appearance is neat and clean. Appears in no acute distress. Well nourished and well developed.. Vitals Time Taken: 8:02 AM, Height: 64 in, Weight: 128 lbs, BMI: 22, Temperature: 97.9 F, Pulse: 82 bpm, Respiratory Rate: 18 breaths/min, Blood Pressure: 128/63 mmHg. Cardiovascular 2/6 systolic ejection murmur that does not radiate in the aortic area.. She has a normal femoral artery pulses as well as popliteal pulses bilaterally.. Pedal pulses absent bilaterally. Both her dorsalis pedis and posterior tibial pulses are absent on the right side but easily palpable on the left. Edema present in both extremities. Her edema is mild. She has chronic hemosiderin deposition worse on the right anterior leg than the left suggestive of chronic venous insufficiency. Gastrointestinal (GI) Abdomen is soft and non-distended without masses or tenderness. Bowel sounds active in all quadrants.. Lymphatic Nonpalpable in the popliteal or inguinal areas. Psychiatric No evidence of depression, anxiety, or agitation. Calm, cooperative, and communicative. Appropriate interactions and affect.. General Notes: Wound exam; the areas on her right anterior leg give. This was covered with surface slough and nonviable subcutaneous tissue which was removed with a curet. Post debridement this actually looks quite healthy there is no evidence of infection. As noted above and noted by her intake nurse at. Her pedal pulses on the right are not palpable. She also has a delayed capillary refill time on the right. On the left all of this seems quite normal. As mentioned above also she has multiple right femoral and popliteal pulses. I wonder if there is some form of distal stenosis  although she does not give a history suggestive of claudication. She also has a history of vasculitis although this is supposedly in remission Integumentary (Hair, Skin) Wound #6 status is Open. Original cause of wound was Trauma. The wound is located on the Right,Medial Lower Leg. The wound measures 2.2cm length x 2.5cm width x 0.1cm depth; 4.32cm^2 area and 0.432cm^3 volume. The wound is limited to skin breakdown. There is no tunneling or undermining noted. There is a small amount of serous drainage noted. The wound margin is flat and intact. There is small (1-33%) red granulation within the wound bed. There is a large (67-100%) amount of necrotic tissue within the wound bed including Eschar. The periwound skin appearance exhibited: Hemosiderin Staining. The periwound skin appearance did not exhibit: Callus, Crepitus, Excoriation, Fluctuance, Friable, Induration, Localized Edema, Rash, Scarring, Dry/Scaly, Maceration, Moist, Atrophie Blanche, Cyanosis, Ecchymosis, Haraway, Kymia W. ([id number]) Mottled, Pallor, Rubor, Erythema. Assessment Active Problems ICD-10 L97.211 - Non-pressure chronic ulcer of right calf limited to breakdown of skin I87.331 - Chronic venous hypertension (idiopathic) with ulcer and inflammation of right lower extremity I70.232 - Atherosclerosis of native arteries of right leg with ulceration of calf Procedures Wound #6 Wound #6 is a Trauma, Other located on the Right,Medial Lower Leg . There was a Skin/Subcutaneous Tissue Debridement [id number]) debridement with total area of 5.5 sq cm performed by Ricard Dillon, MD. with the following instrument(s): Curette to remove Viable and Non-Viable tissue/material including Exudate, Fibrin/Slough, Eschar, and Subcutaneous after achieving pain control using Other (lidocaine 4%). A time out was conducted prior to the start of  the procedure. A Minimum amount of bleeding was controlled with Pressure. The procedure was  tolerated well with a pain level of 0 throughout and a pain level of 0 following the procedure. Post Debridement Measurements: 2.2cm length x 2.5cm width x 0.1cm depth; 0.432cm^3 volume. Post procedure Diagnosis Wound #6: Same as Pre-Procedure Plan Wound Cleansing: Wound #6 Right,Medial Lower Leg: Clean wound with Normal Saline. Anesthetic: Wound #6 Right,Medial Lower Leg: Topical Lidocaine 4% cream applied to wound bed prior to debridement Primary Wound Dressing: Wound #6 Right,Medial Lower Leg: Prisma Ag Secondary Dressing: Frayre, THIRZA TYSZKA. ([id number]) Wound #6 Right,Medial Lower Leg: Boardered Foam Dressing Dressing Change Frequency: Wound #6 Right,Medial Lower Leg: Change dressing every other day. Follow-up Appointments: Wound #6 Right,Medial Lower Leg: Return Appointment in 1 week. Edema Control: Wound #6 Right,Medial Lower Leg: Elevate legs to the level of the heart and pump ankles as often as possible Additional Orders / Instructions: Wound #6 Right,Medial Lower Leg: Increase protein intake. Consults ordered were: Vascular - Arterial Studies - Right Lower Leg #1 we applied Prisma and border foam to the patient's wound on the right leg. She will change this once before we see her next week. #2 I have sent her for arterial studies given the asymmetry of the pedal pulses on the right versus the left, much weaker on the right #3 I have no doubt the patient has some degree of chronic venous insufficiency however her wounds in the past have closed quite quickly with basic treatment in this clinic and unless this wound is delayed in healing or she has recurrent wounds with proper compression garments I am not going to send her for reflux studies at the moment. #4 she will definitely require some form of compression stockings when this wound heals Electronic Signature(s) Signed: 01/30/2016 4:24:59 PM By: Linton Ham MD Entered By: Linton Ham on 01/30/2016  09:01:50 Schlesinger, [name] ([id number]) -------------------------------------------------------------------------------- ROS/PFSH Details Patient Name: [name] Date of Service: 01/30/2016 8:00 AM Medical Record Patient Account Number: [id number] Number: Treating RN: Cornell Barman 09-20-1928 (82 y.o. Other Clinician: Date of Birth/Sex: Female) Treating ROBSON, MICHAEL Primary Care Physician/Extender: Carolan Shiver, ERIC Physician: Referring Physician: DOSS, CARRIE Weeks in Treatment: 0 Label Progress Note Print Version as History and Physical for this encounter Information Obtained From Patient Wound History Do you currently have one or more open woundso Yes How many open wounds do you currently haveo 1 Approximately how long have you had your woundso 1 month How have you been treating your wound(s) until nowo neosporin; triple abx ointment Has your wound(s) ever healed and then re-openedo No Have you had any lab work done in the past montho No Have you tested positive for an antibiotic resistant organism (MRSA, No VRE)o Have you tested positive for osteomyelitis (bone infection)o No Have you had any tests for circulation on your legso No Integumentary (Skin) Complaints and Symptoms: Positive for: Wounds; Bleeding or bruising tendency Negative for: Breakdown; Swelling Medical History: Negative for: History of Burn; History of pressure wounds Constitutional Symptoms (General Health) Complaints and Symptoms: No Complaints or Symptoms Medical History: Past Medical History Notes: HTN; vasculitis (in remission); tumor removed from kidney (cancer) Eyes Complaints and Symptoms: No Complaints or Symptoms Sparlin, Bernestine W. ([id number]) Medical History: Positive for: Cataracts - removed (B) Negative for: Glaucoma; Optic Neuritis Ear/Nose/Mouth/Throat Complaints and Symptoms: No Complaints or Symptoms Medical History: Negative for: Chronic sinus  problems/congestion; Middle ear problems Hematologic/Lymphatic Complaints and Symptoms: No Complaints or Symptoms Medical History: Negative  for: Anemia; Hemophilia; Human Immunodeficiency Virus; Lymphedema; Sickle Cell Disease Respiratory Complaints and Symptoms: No Complaints or Symptoms Medical History: Negative for: Aspiration; Asthma; Chronic Obstructive Pulmonary Disease (COPD); Pneumothorax; Sleep Apnea; Tuberculosis Cardiovascular Complaints and Symptoms: No Complaints or Symptoms Medical History: Positive for: Hypertension; Vasculitis Negative for: Angina; Arrhythmia; Congestive Heart Failure; Coronary Artery Disease; Deep Vein Thrombosis; Hypotension; Myocardial Infarction; Peripheral Arterial Disease; Peripheral Venous Disease; Phlebitis Gastrointestinal Complaints and Symptoms: No Complaints or Symptoms Medical History: Negative for: Cirrhosis ; Colitis; Crohnos; Hepatitis A; Hepatitis B; Hepatitis C Past Medical History Notes: hiatal hernia; chronic constipation Endocrine Finnan, Eyva W. ([id number]) Complaints and Symptoms: No Complaints or Symptoms Medical History: Negative for: Type I Diabetes; Type II Diabetes Genitourinary Complaints and Symptoms: No Complaints or Symptoms Medical History: Past Medical History Notes: Kidney CA- (L) partial removal Immunological Complaints and Symptoms: No Complaints or Symptoms Medical History: Negative for: Lupus Erythematosus; Raynaudos; Scleroderma Past Medical History Notes: compromised immune system Musculoskeletal Complaints and Symptoms: No Complaints or Symptoms Medical History: Positive for: Osteoarthritis Negative for: Gout; Rheumatoid Arthritis; Osteomyelitis Neurologic Complaints and Symptoms: No Complaints or Symptoms Medical History: Negative for: Dementia; Neuropathy; Quadriplegia; Paraplegia; Seizure Disorder Oncologic Complaints and Symptoms: No Complaints or Symptoms Medical  History: Negative for: Received Chemotherapy; Received Radiation Past Medical History Notes: Kidney CA (L) Mcdougald, GYLLIAN MELLADO. ([id number]) Psychiatric Complaints and Symptoms: No Complaints or Symptoms Medical History: Negative for: Anorexia/bulimia; Confinement Anxiety HBO Extended History Items Eyes: Cataracts Immunizations Tetanus Vaccine: Last tetanus shot: 12/25/2014 Hospitalization / Surgery History Name of Hospital Purpose of Hospitalization/Surgery Date [facility] Partial (L) kidney removal- [place] 08/04/1998 Family and Social History Cancer: No; Diabetes: No; Heart Disease: Yes - Father; Hereditary Spherocytosis: No; Hypertension: No; Kidney Disease: No; Lung Disease: No; Seizures: No; Stroke: No; Thyroid Problems: No; Tuberculosis: No; Never smoker; Marital Status - Widowed; Alcohol Use: Never; Drug Use: No History; Caffeine Use: Daily; Advanced Directives: Yes (Not Provided); Patient does not want information on Advanced Directives; Do not resuscitate: No; Living Will: Yes (Not Provided); Medical Power of Attorney: Yes - daughter- Garry Heater (Not Provided) Electronic Signature(s) Signed: 01/30/2016 4:24:59 PM By: Linton Ham MD Signed: 01/31/2016 3:00:23 PM By: Gretta Cool RN, BSN, Kim RN, BSN Entered By: Gretta Cool, RN, BSN, Kim on 01/30/2016 08:25:24 Sinning, [name] ([id number]) -------------------------------------------------------------------------------- [place] Details Patient Name: [name] Date of Service: 01/30/2016 Medical Record Patient Account Number: [id number] Number: Treating RN: Cornell Barman Mar 15, 1929 (82 y.o. 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 Facility Procedures CPT4: Description Modifier Quantity Code [id number] - WOUND CARE VISIT-LEV 3 EST PT 1 CPT4: [id number] - DEB SUBQ TISSUE 20 SQ CM/< 1 ICD-10 Description Diagnosis I87.331 Chronic venous hypertension (idiopathic) with ulcer and inflammation of right lower extremity Physician Procedures CPT4: Description Modifier Quantity Code [id number] - WC PHYS LEVEL 4 - NEW PT 25 1 ICD-10 Description Diagnosis I87.331 Chronic venous hypertension (idiopathic) with ulcer and inflammation of right lower extremity CPT4: [id number] - WC PHYS SUBQ TISS 20 SQ CM 1 ICD-10 Description Diagnosis I87.331 Chronic venous hypertension (idiopathic) with ulcer and inflammation of right lower extremity Schaum, Reece W. ([id number]) Electronic Signature(s) Signed:  01/30/2016 4:24:59 PM By: Linton Ham MD Entered By: Linton Ham on 01/30/2016 09:03:02

## 2016-02-01 NOTE — Progress Notes (Signed)
SHAQUIDA, LEMAR ([id number]) Visit Report for 01/30/2016 Allergy List Details Patient Name: [name]. Date of Service: 01/30/2016 8:00 AM Medical Record Patient Account Number: [id number] Number: Treating RN: [name] [birth year] (82 y.o. Other Clinician: Date of Birth/Sex: Female) Treating [name] Primary Care Physician/Extender: [name] Physician: Referring Physician: Flonnie Overman, Kelly Weeks in Treatment: 0 Allergies Active Allergies amoxicillin Reaction: itching NSAIDS (Non-Steroidal Anti-Inflammatory Drug) tolmetin Allergy Notes Electronic Signature(s) Signed: 01/31/2016 3:00:23 PM By: Suzanne Cool, RN, Kelly, [name] Entered By: Suzanne Cool, RN, Kelly, Suzanne on 01/30/2016 08:21:30 [name] ([id number]) -------------------------------------------------------------------------------- Arrival Information Details Patient Name: [name] Date of Service: 01/30/2016 8:00 AM Medical Record Patient Account Number: [id number] Number: Treating RN: [name] [birth year] (82 y.o. Other Clinician: Date of Birth/Sex: Female) Treating [name] Primary Care Physician/Extender: [name] Physician: Referring Physician: Flonnie Overman, Kelly Weeks in Treatment: 0 Visit Information Patient Arrived: Ambulatory Arrival Time: 08:02 Accompanied By: self Transfer Assistance: None Patient Identification Verified: Yes Secondary Verification Process Yes Completed: Patient Has Alerts: No History Since Last Visit Added or deleted any medications: No Any new allergies or adverse reactions: No Had a fall or experienced change in activities of daily living that may affect risk of falls: No Signs or symptoms of abuse/neglect since last visito No Hospitalized since last visit: No Pain Present Now: No Electronic Signature(s) Signed: 01/31/2016 3:00:23 PM By: Suzanne Cool, RN, Kelly, [name] Entered By: Suzanne Cool, RN, Kelly, Suzanne on 01/30/2016  08:02:36 [name] ([id number]) -------------------------------------------------------------------------------- Clinic Level of Care Assessment Details Patient Name: [name] Date of Service: 01/30/2016 8:00 AM Medical Record Patient Account Number: [id number] Number: Treating RN: [name] [name] (82 y.o. Other Clinician: Date of Birth/Sex: Female) Treating [name] Primary Care Physician/Extender: [name] Physician: Referring Physician: DOSS, Kelly Weeks in Treatment: 0 Clinic Level of Care Assessment Items TOOL 1 Quantity Score []  - Use when EandM and Procedure is performed on INITIAL visit 0 ASSESSMENTS - Nursing Assessment / Reassessment X - General Physical Exam (combine w/ comprehensive assessment (listed just 1 20 below) when performed on new pt. evals) X - Comprehensive Assessment (HX, ROS, Risk Assessments, Wounds Hx, etc.) 1 25 ASSESSMENTS - Wound and Skin Assessment / Reassessment []  - Dermatologic / Skin Assessment (not related to wound area) 0 ASSESSMENTS - Ostomy and/or Continence Assessment and Care []  - Incontinence Assessment and Management 0 []  - Ostomy Care Assessment and Management (repouching, etc.) 0 PROCESS - Coordination of Care X - Simple Patient / Family Education for ongoing care 1 15 []  - Complex (extensive) Patient / Family Education for ongoing care 0 X - Staff obtains Programmer, systems, Records, Test Results / Process Orders 1 10 []  - Staff telephones HHA, Nursing Homes / Clarify orders / etc 0 []  - Routine Transfer to another Facility (non-emergent condition) 0 []  - Routine Hospital Admission (non-emergent condition) 0 X - New Admissions / Biomedical engineer / Ordering NPWT, Apligraf, etc. 1 15 []  - Emergency Hospital Admission (emergent condition) 0 PROCESS - Special Needs []  - Pediatric / Minor Patient Management 0 Fontenette, Anastyn W. ([id number]) []  - Isolation Patient Management 0 []  - Hearing /  Language / Visual special needs 0 []  - Assessment of Community assistance (transportation, D/C planning, etc.) 0 []  - Additional assistance / Altered mentation 0 []  - Support Surface(s) Assessment (bed, cushion, seat, etc.) 0 INTERVENTIONS - Miscellaneous []  - External ear exam 0 []  - Patient Transfer (multiple staff / Civil Service fast streamer /  Similar devices) 0 []  - Simple Staple / Suture removal (25 or less) 0 []  - Complex Staple / Suture removal (26 or more) 0 []  - Hypo/Hyperglycemic Management (do not check if billed separately) 0 X - Ankle / Brachial Index (ABI) - do not check if billed separately 1 15 Has the patient been seen at the hospital within the last three years: Yes Total Score: 100 Level Of Care: New/Established - Level 3 Electronic Signature(s) Signed: 01/31/2016 3:00:23 PM By: Suzanne Cool, RN, Kelly, [name] Entered By: Suzanne Cool, RN, Kelly, Suzanne on 01/30/2016 08:45:52 [name] ([id number]) -------------------------------------------------------------------------------- Encounter Discharge Information Details Patient Name: [name] Date of Service: 01/30/2016 8:00 AM Medical Record Patient Account Number: [id number] Number: Treating RN: [name] [birth year] (82 y.o. Other Clinician: Date of Birth/Sex: Female) Treating [name] Primary Care Physician/Extender: [name] Physician: Referring Physician: Flonnie Overman, Kelly Weeks in Treatment: 0 Encounter Discharge Information Items Discharge Pain Level: 0 Discharge Condition: Stable Ambulatory Status: Ambulatory Discharge Destination: Home Transportation: Private Auto Accompanied By: self Schedule Follow-up Appointment: Yes Medication Reconciliation completed and provided to Patient/Care Yes [name]: Provided on Clinical Summary of Care: 01/30/2016 Form Type Recipient Paper Patient NB Electronic Signature(s) Signed: 01/30/2016 8:55:50 AM By: [name] Entered By: [name] on  01/30/2016 08:55:49 [name] ([id number]) -------------------------------------------------------------------------------- Lower Extremity Assessment Details Patient Name: [name] Date of Service: 01/30/2016 8:00 AM Medical Record Patient Account Number: [id number] Number: Treating RN: [name] [birth year] (82 y.o. Other Clinician: Date of Birth/Sex: Female) Treating [name] Primary Care Physician/Extender: [name] Physician: Referring Physician: DOSS, Kelly Weeks in Treatment: 0 Edema Assessment Assessed: [Left: No] [Right: No] E[Left: dema] [Right: :] Calf Left: Right: Point of Measurement: 32 cm From Medial Instep 33.8 cm 32.5 cm Ankle Left: Right: Point of Measurement: 11 cm From Medial Instep 21.5 cm 21 cm Vascular Assessment Pulses: Posterior Tibial Palpable: [Left:No] [Right:No] Doppler: [Left:Monophasic] Dorsalis Pedis Palpable: [Left:Yes] [Right:No] Doppler: [Left:Monophasic] Extremity colors, hair growth, and conditions: Extremity Color: [Left:Normal] [Right:Hyperpigmented] Hair Growth on Extremity: [Left:Yes] [Right:Yes] Temperature of Extremity: [Left:Warm] [Right:Warm] Capillary Refill: [Left:< 3 seconds] [Right:> 3 seconds] Toe Nail Assessment Left: Right: Thick: No No Discolored: Yes Yes Deformed: No No Improper Length and Hygiene: No No Dutko, Suzanne W. ([id number]) Notes Patient is non-compressible >220 on DP bilaterally; PT on right. Electronic Signature(s) Signed: 01/31/2016 3:00:23 PM By: Suzanne Cool, RN, Kelly, [name] Entered By: Suzanne Cool, RN, Kelly, Suzanne on 01/30/2016 08:17:46 Keatley, [name] ([id number]) -------------------------------------------------------------------------------- Multi Wound Chart Details Patient Name: [name] Date of Service: 01/30/2016 8:00 AM Medical Record Patient Account Number: [id number] Number: Treating RN: [name] [birth year] (82 y.o. Other  Clinician: Date of Birth/Sex: Female) Treating [name] Primary Care Physician/Extender: [name] Physician: Referring Physician: DOSS, Kelly Weeks in Treatment: 0 Vital Signs Height(in): 64 Pulse(bpm): 82 Weight(lbs): 128 Blood Pressure 128/63 (mmHg): Body Mass Index(BMI): 22 Temperature(F): 97.9 Respiratory Rate 18 (breaths/min): Photos: [N/A:N/A] Wound Location: Right Lower Leg - Medial N/A N/A Wounding Event: Trauma N/A N/A Primary Etiology: Trauma, Other N/A N/A Secondary Etiology: Venous Leg Ulcer N/A N/A Date Acquired: 12/30/2015 N/A N/A Weeks of Treatment: 0 N/A N/A Wound Status: Open N/A N/A Measurements L x W x D 2.2x2.5x0.1 N/A N/A (cm) Area (cm) : 4.32 N/A N/A Volume (cm) : 0.432 N/A N/A Classification: Partial Thickness N/A N/A Exudate Amount: Small N/A N/A Exudate Type: Serous N/A N/A Exudate Color: amber N/A N/A Wound Margin: Flat and Intact  N/A N/A Granulation Amount: Small (1-33%) N/A N/A Granulation Quality: Red N/A N/A Necrotic Amount: Large (67-100%) N/A N/A Necrotic Tissue: Eschar N/A N/A Arango, Suzanne VESTER. ([id number]) Exposed Structures: Fascia: No N/A N/A Fat: No Tendon: No Muscle: No Joint: No Bone: No Limited to Skin Breakdown Epithelialization: None N/A N/A Periwound Skin Texture: Edema: No N/A N/A Excoriation: No Induration: No Callus: No Crepitus: No Fluctuance: No Friable: No Rash: No Scarring: No Periwound Skin Maceration: No N/A N/A Moisture: Moist: No Dry/Scaly: No Periwound Skin Color: Hemosiderin Staining: Yes N/A N/A Atrophie Blanche: No Cyanosis: No Ecchymosis: No Erythema: No Mottled: No Pallor: No Rubor: No Tenderness on No N/A N/A Palpation: Wound Preparation: Ulcer Cleansing: N/A N/A Rinsed/Irrigated with Saline Topical Anesthetic Applied: Other: lidocaine 4% Treatment Notes Electronic Signature(s) Signed: 01/31/2016 3:00:23 PM By: Suzanne Cool, RN, Kelly, [name] Entered By:  Suzanne Cool, RN, Kelly, Suzanne on 01/30/2016 08:36:29 Hinely, [name] ([id number]) -------------------------------------------------------------------------------- [place] Details Patient Name: [name] Date of Service: 01/30/2016 8:00 AM Medical Record Patient Account Number: [id number] Number: Treating RN: [name] [birth year] (82 y.o. Other Clinician: Date of Birth/Sex: Female) Treating [name] Primary Care Physician/Extender: [name] Physician: Referring Physician: Flonnie Overman, Kelly Weeks in Treatment: 0 Active Inactive Orientation to the Wound Care Program Nursing Diagnoses: Knowledge deficit related to the wound healing center program Goals: Patient/caregiver will verbalize understanding of the [place] Program Date Initiated: 01/30/2016 Goal Status: Active Interventions: Provide education on orientation to the wound center Notes: Venous Leg Ulcer Nursing Diagnoses: Potential for venous Insuffiency (use before diagnosis confirmed) Goals: Patient will maintain optimal edema control Date Initiated: 01/30/2016 Goal Status: Active Interventions: Assess peripheral edema status every visit. Notes: Wound/Skin Impairment Nursing Diagnoses: Impaired tissue integrity Karlen, Suzanne W. ([id number]) Goals: Ulcer/skin breakdown will heal within 14 weeks Date Initiated: 01/30/2016 Goal Status: Active Interventions: Assess patient/caregiver ability to obtain necessary supplies Assess ulceration(s) every visit Notes: Electronic Signature(s) Signed: 01/31/2016 3:00:23 PM By: Suzanne Cool, RN, Kelly, [name] Entered By: Suzanne Cool, RN, Kelly, Suzanne on 01/30/2016 08:27:38 Schmutz, [name] ([id number]) -------------------------------------------------------------------------------- Pain Assessment Details Patient Name: [name] Date of Service: 01/30/2016 8:00 AM Medical Record Patient Account Number:  [id number] Number: Treating RN: [name] [birth year] Kelly (82 y.o. Other Clinician: Date of Birth/Sex: Female) Treating [name] Primary Care Physician/Extender: [name] Physician: Referring Physician: Flonnie Overman, Kelly Weeks in Treatment: 0 Active Problems Location of Pain Severity and Description of Pain Patient Has Paino No Site Locations With Dressing Change: No Pain Management and Medication Current Pain Management: Electronic Signature(s) Signed: 01/31/2016 3:00:23 PM By: Suzanne Cool, RN, Kelly, [name] Entered By: Suzanne Cool, RN, Kelly, Suzanne on 01/30/2016 08:02:42 Helzer, [name] ([id number]) -------------------------------------------------------------------------------- Patient/Caregiver Education Details Patient Name: [name] Date of Service: 01/30/2016 8:00 AM Medical Record Patient Account Number: [id number] Number: Treating RN: [name] [birth year] (82 y.o. Other Clinician: Date of Birth/Gender: Female) Treating [name] Primary Care Physician: Caryl Bis, Kelly Physician/Extender: G Referring Physician: Carmine Savoy in Treatment: 0 Education Assessment Education Provided To: Patient Education Topics Provided Welcome To [facility] [place]: Handouts: Welcome To [facility] [place] Methods: Demonstration Responses: State content correctly Electronic Signature(s) Signed: 01/31/2016 3:00:23 PM By: Suzanne Cool, RN, Kelly, [name] Entered By: Suzanne Cool, RN, Kelly, Suzanne on 01/30/2016 08:46:55 Weekly, [name] ([id number]) -------------------------------------------------------------------------------- Wound Assessment Details Patient Name: [name] Date of Service: 01/30/2016 8:00 AM Medical Record Patient Account Number: [id number] Number: Treating RN: [name],  Suzanne [birth year] (82 y.o. Other Clinician: Date of Birth/Sex: Female) Treating [name] Primary Care Physician/Extender: Suzanne Shiver,  Kelly Physician: Referring Physician: DOSS, Kelly Weeks in Treatment: 0 Wound Status Wound Number: 6 Primary Etiology: Trauma, Other Wound Location: Right Lower Leg - Medial Secondary Etiology: Venous Leg Ulcer Wounding Event: Trauma Wound Status: Open Date Acquired: 12/30/2015 Weeks Of Treatment: 0 Clustered Wound: No Photos Wound Measurements Length: (cm) 2.2 Width: (cm) 2.5 Depth: (cm) 0.1 Area: (cm) 4.32 Volume: (cm) 0.432 % Reduction in Area: % Reduction in Volume: Epithelialization: None Tunneling: No Undermining: No Wound Description Classification: Partial Thickness Wound Margin: Flat and Intact Exudate Amount: Small Exudate Type: Serous Exudate Color: amber Wound Bed Granulation Amount: Small (1-33%) Exposed Structure Granulation Quality: Red Fascia Exposed: No Necrotic Amount: Large (67-100%) Fat Layer Exposed: No Leever, Suzanne W. ([id number]) Necrotic Quality: Eschar Tendon Exposed: No Muscle Exposed: No Joint Exposed: No Bone Exposed: No Limited to Skin Breakdown Periwound Skin Texture Texture Color No Abnormalities Noted: No No Abnormalities Noted: No Callus: No Atrophie Blanche: No Crepitus: No Cyanosis: No Excoriation: No Ecchymosis: No Fluctuance: No Erythema: No Friable: No Hemosiderin Staining: Yes Induration: No Mottled: No Localized Edema: No Pallor: No Rash: No Rubor: No Scarring: No Moisture No Abnormalities Noted: No Dry / Scaly: No Maceration: No Moist: No Wound Preparation Ulcer Cleansing: Rinsed/Irrigated with Saline Topical Anesthetic Applied: Other: lidocaine 4%, Treatment Notes Wound #6 (Right, Medial Lower Leg) 1. Cleansed with: Clean wound with Normal Saline 2. Anesthetic Topical Lidocaine 4% cream to wound bed prior to debridement 4. Dressing Applied: Prisma Ag 5. Secondary Dressing Applied Bordered Foam Dressing Electronic Signature(s) Signed: 01/31/2016 3:00:23 PM By: Suzanne Cool, RN, Kelly, Kim RN,  Kelly Entered By: Suzanne Cool, RN, Kelly, Suzanne on 01/30/2016 08:20:50 Trahan, [name] ([id number]) -------------------------------------------------------------------------------- Vitals Details Patient Name: [name] Date of Service: 01/30/2016 8:00 AM Medical Record Patient Account Number: [id number] Number: Treating RN: [name] [birth year] Kelly (82 y.o. Other Clinician: Date of Birth/Sex: Female) Treating [name] Primary Care Physician/Extender: [name] Physician: Referring Physician: DOSS, Kelly Weeks in Treatment: 0 Vital Signs Time Taken: 08:02 Temperature (F): 97.9 Height (in): 64 Pulse (bpm): 82 Weight (lbs): 128 Respiratory Rate (breaths/min): 18 Body Mass Index (BMI): 22 Blood Pressure (mmHg): 128/63 Reference Range: 80 - 120 mg / dl Electronic Signature(s) Signed: 01/31/2016 3:00:23 PM By: Suzanne Cool, RN, Kelly, [name] Entered By: Suzanne Cool, RN, Kelly, Suzanne on 01/30/2016 08:03:25

## 2016-02-04 ENCOUNTER — Ambulatory Visit: Payer: Medicare Other

## 2016-02-04 DIAGNOSIS — S81801D Unspecified open wound, right lower leg, subsequent encounter: Secondary | ICD-10-CM | POA: Diagnosis not present

## 2016-02-05 ENCOUNTER — Encounter: Payer: Self-pay | Admitting: Family Medicine

## 2016-02-05 NOTE — Progress Notes (Signed)
I reviewed the above note and agree.  Edgard Debord, M.D. 

## 2016-02-06 ENCOUNTER — Encounter: Payer: Medicare Other | Attending: Internal Medicine | Admitting: Internal Medicine

## 2016-02-06 DIAGNOSIS — I70232 Atherosclerosis of native arteries of right leg with ulceration of calf: Secondary | ICD-10-CM | POA: Diagnosis not present

## 2016-02-06 DIAGNOSIS — K219 Gastro-esophageal reflux disease without esophagitis: Secondary | ICD-10-CM | POA: Insufficient documentation

## 2016-02-06 DIAGNOSIS — N189 Chronic kidney disease, unspecified: Secondary | ICD-10-CM | POA: Diagnosis not present

## 2016-02-06 DIAGNOSIS — I129 Hypertensive chronic kidney disease with stage 1 through stage 4 chronic kidney disease, or unspecified chronic kidney disease: Secondary | ICD-10-CM | POA: Diagnosis not present

## 2016-02-06 DIAGNOSIS — M1990 Unspecified osteoarthritis, unspecified site: Secondary | ICD-10-CM | POA: Diagnosis not present

## 2016-02-06 DIAGNOSIS — I87331 Chronic venous hypertension (idiopathic) with ulcer and inflammation of right lower extremity: Secondary | ICD-10-CM | POA: Insufficient documentation

## 2016-02-06 DIAGNOSIS — L97211 Non-pressure chronic ulcer of right calf limited to breakdown of skin: Secondary | ICD-10-CM | POA: Diagnosis present

## 2016-02-06 DIAGNOSIS — E079 Disorder of thyroid, unspecified: Secondary | ICD-10-CM | POA: Diagnosis not present

## 2016-02-06 DIAGNOSIS — Z85528 Personal history of other malignant neoplasm of kidney: Secondary | ICD-10-CM | POA: Diagnosis not present

## 2016-02-07 ENCOUNTER — Ambulatory Visit (INDEPENDENT_AMBULATORY_CARE_PROVIDER_SITE_OTHER): Payer: Medicare Other | Admitting: Cardiovascular Disease

## 2016-02-07 ENCOUNTER — Encounter: Payer: Self-pay | Admitting: Cardiovascular Disease

## 2016-02-07 VITALS — BP 100/62 | HR 68 | Ht 64.0 in | Wt 125.2 lb

## 2016-02-07 DIAGNOSIS — R002 Palpitations: Secondary | ICD-10-CM

## 2016-02-07 DIAGNOSIS — S81801D Unspecified open wound, right lower leg, subsequent encounter: Secondary | ICD-10-CM | POA: Diagnosis not present

## 2016-02-07 NOTE — Patient Instructions (Signed)
Medication Instructions:  Your physician recommends that you continue on your current medications as directed. Please refer to the Current Medication list given to you today.   Labwork: none  Testing/Procedures: none  Follow-Up: Your physician recommends that you schedule a follow-up appointment as needed.    Any Other Special Instructions Will Be Listed Below (If Applicable).     If you need a refill on your cardiac medications before your next appointment, please call your pharmacy.   

## 2016-02-08 NOTE — Progress Notes (Signed)
ZENIYAH, ZORA ([id number]) Visit Report for 02/06/2016 Chief Complaint Document Details Patient Name: [name], [name]. Date of Service: 02/06/2016 8:00 AM Medical Record Patient Account Number: [id number] Number: Treating RN: Cornell Barman [birth year] (82 y.o. Other Clinician: Date of Birth/Sex: Female) Treating ROBSON, MICHAEL Primary Care Physician/Extender: Carolan Shiver, ERIC Physician: Referring Physician: Caryl Bis, ERIC Weeks in Treatment: 1 Information Obtained from: Patient Chief Complaint Right calf traumatic ulceration. Right hand skin tear (healed). 01/30/16; patient returns to clinic with a wound on her right anterior leg that is been present for a month. Electronic Signature(s) Signed: 02/07/2016 7:30:59 AM By: Linton Ham MD Entered By: Linton Ham on 02/06/2016 08:30:31 Birdwell, [name] ([id number]) -------------------------------------------------------------------------------- HPI Details Patient Name: [name] Date of Service: 02/06/2016 8:00 AM Medical Record Patient Account Number: [id number] Number: Treating RN: Cornell Barman Jan 12, 1929 (82 y.o. Other Clinician: Date of Birth/Sex: Female) Treating ROBSON, MICHAEL Primary Care Physician/Extender: Carolan Shiver, ERIC Physician: Referring Physician: Caryl Bis, ERIC Weeks in Treatment: 1 History of Present Illness HPI Description: Pleasant 82 year old with history of Arthritis;GERD (gastroesophageal reflux disease);Hypertension;Chronic kidney diseaseo;Vasculitis, ANCA positive, pulmonary hemorrhage;Thyroid disease;hyperthyroid, had radioactiveiodine treatment;Renal cell carcinoma 2003 s/p sgy for removal right kidney;Hemorrhoids.o She has also had several surgeries including abdominal hysterectomy with bilateral salpingo-oophorectomy, hernia repair, fracture of her femur on the right side, cholecystectomy, cardiac catheterization. She traumatized her right calf at 2 locations in  November 2016. More recently fell and suffered a skin tear to her right hand (healed). Ambulating per her baseline. No significant pain. Right ABI 0.75. Performing dressing changes with silver alginate and using a Tubigrip for edema control. She returns to clinic for follow-up and is without complaints other than the silver alginate sticking to her wounds. No significant pain. No fever or chills. Minimal drainage. 01/30/16; this is an 82 year old woman who was previously been in this clinic on 2 occasions. In June 2016 she had a left leg wound and a right forearm wound. The lower extremity wound I think was caused by hitting her leg on the dishwasher door and the arm on the car door. She healed in 2-3 weeks with standard therapy. She was here in December 2016 with her traumatic wound to the right calf again this healed fairly quickly. She has an ABI on the right leg "it is 0.75. Today she is noncompressible. She does have some form of stocking but I don't think wears them reliably in I don't think there prescription stockings area she has a history of an Anka-positive vasculitis and is on prednisone. Her past medical history is reviewed. She does not complain of anything that sounds like claudication still an active woman who lives in an independent living setting about a month ago she tells me she hit her right anterior leg on a box. This caused a open wound. She is been treating this with Neosporin without improvement. She is not a diabetic. Her arterial ABI in this clinic was noncompressible the patient is been seen in this clinic 2 times before. However I was not involved in her care nor was any of the current physician group in this clinic 02/06/16; good progress/Prisma. Has been for her arterial studies although we don't have those results Electronic Signature(s) Signed: 02/07/2016 7:30:59 AM By: Linton Ham MD Entered By: Linton Ham on 02/06/2016 08:31:16 Wiberg, [name]  ([id number]) Dulski, Hoffman ([id number]) -------------------------------------------------------------------------------- Physical Exam Details Patient Name: [name] Date of Service: 02/06/2016 8:00 AM Medical Record Patient Account Number: [id number] Number: Treating  RN: Cornell Barman [birth year] (82 y.o. Other Clinician: Date of Birth/Sex: Female) Treating ROBSON, MICHAEL Primary Care Physician/Extender: Carolan Shiver, ERIC Physician: Referring Physician: Caryl Bis, ERIC Weeks in Treatment: 1 Constitutional Sitting or standing Blood Pressure is within target range for patient.. Slightly bradycardic. Respirations regular, non-labored and within target range.. Temperature is normal and within the target range for the patient.. Patient's appearance is neat and clean. Appears in no acute distress. Well nourished and well developed.. Eyes Conjunctivae clear. No discharge.Marland Kitchen Respiratory Respiratory effort is easy and symmetric bilaterally. Rate is normal at rest and on room air.. Cardiovascular Much the same as last week, pulses in the right foot are not palpable although there is easily palpable in the left especially dorsalis pedis. Edema present in both extremities although the edema is mild. She clearly has hemosiderin deposition and some evidence of venous insufficiency. Lymphatic None palpable in the popliteal or inguinal areas. Psychiatric No evidence of depression, anxiety, or agitation. Calm, cooperative, and communicative. Appropriate interactions and affect.. Notes Wound exam; the wound is on the right anterior leg.. There is some surface slough year under the light although I did not debridement this. She has advancing epithelialization no evidence of surrounding infection. As noted pulses in her right foot are not palpable but easily palpable on the left. She has had her arterial studies Electronic Signature(s) Signed: 02/07/2016 7:30:59 AM By: Linton Ham MD Entered By: Linton Ham on 02/06/2016 08:34:40 Morning, [name] ([id number]) -------------------------------------------------------------------------------- Physician Orders Details Patient Name: [name] Date of Service: 02/06/2016 8:00 AM Medical Record Patient Account Number: [id number] Number: Treating RN: Cornell Barman 04-24-1929 (82 y.o. Other Clinician: Date of Birth/Sex: Female) Treating ROBSON, MICHAEL Primary Care Physician/Extender: Carolan Shiver, ERIC Physician: Referring Physician: Caryl Bis ERIC Weeks in Treatment: 1 Verbal / Phone Orders: Yes Clinician: Cornell Barman Read Back and Verified: Yes Diagnosis Coding Wound Cleansing Wound #6 Right,Medial Lower Leg o Clean wound with Normal Saline. Anesthetic Wound #6 Right,Medial Lower Leg o Topical Lidocaine 4% cream applied to wound bed prior to debridement Primary Wound Dressing Wound #6 Right,Medial Lower Leg o Prisma Ag Secondary Dressing Wound #6 Right,Medial Lower Leg o Boardered Foam Dressing Dressing Change Frequency Wound #6 Right,Medial Lower Leg o Other: - patient to change once weekly (Sunday) Follow-up Appointments Wound #6 Right,Medial Lower Leg o Return Appointment in 1 week. Edema Control Wound #6 Right,Medial Lower Leg o Elevate legs to the level of the heart and pump ankles as often as possible o Other: - Order compression stockings Additional Orders / Instructions Wound #6 Right,Medial Lower Leg o Increase protein intake. ONDA, PRY ([id number]) Electronic Signature(s) Signed: 02/07/2016 7:30:59 AM By: Linton Ham MD Signed: 02/07/2016 4:09:14 PM By: Gretta Cool RN, BSN, Kim RN, BSN Entered By: Gretta Cool, RN, BSN, Kim on 02/06/2016 08:20:55 Catterton, [name] ([id number]) -------------------------------------------------------------------------------- Problem List Details Patient Name: [name] Date of Service: 02/06/2016 8:00  AM Medical Record Patient Account Number: [id number] Number: Treating RN: Cornell Barman 1929/04/10 (82 y.o. Other Clinician: Date of Birth/Sex: Female) Treating ROBSON, MICHAEL Primary Care Physician/Extender: Carolan Shiver, ERIC Physician: Referring Physician: Caryl Bis, ERIC Weeks in Treatment: 1 Active Problems ICD-10 Encounter Code Description Active Date Diagnosis L97.211 Non-pressure chronic ulcer of right calf limited to 01/30/2016 Yes breakdown of skin I87.331 Chronic venous hypertension (idiopathic) with ulcer and 01/30/2016 Yes inflammation of right lower extremity I70.232 Atherosclerosis of native arteries of right leg with 01/30/2016 Yes ulceration of calf Inactive Problems Resolved Problems Electronic Signature(s) Signed: 02/07/2016 7:30:59 AM  By: Linton Ham MD Entered By: Linton Ham on 02/06/2016 08:30:14 Hustead, [name] ([id number]) -------------------------------------------------------------------------------- Progress Note Details Patient Name: [name] Date of Service: 02/06/2016 8:00 AM Medical Record Patient Account Number: [id number] Number: Treating RN: Cornell Barman [birth year] (82 y.o. Other Clinician: Date of Birth/Sex: Female) Treating ROBSON, MICHAEL Primary Care Physician/Extender: Carolan Shiver, ERIC Physician: Referring Physician: Caryl Bis, ERIC Weeks in Treatment: 1 Subjective Chief Complaint Information obtained from Patient Right calf traumatic ulceration. Right hand skin tear (healed). 01/30/16; patient returns to clinic with a wound on her right anterior leg that is been present for a month. History of Present Illness (HPI) Pleasant 82 year old with history of Arthritis;GERD (gastroesophageal reflux disease);Hypertension;Chronic kidney disease ;Vasculitis, ANCA positive, pulmonary hemorrhage;Thyroid disease;hyperthyroid, had radioactiveiodine treatment;Renal cell carcinoma 2003 s/p sgy for removal right  kidney;Hemorrhoids. She has also had several surgeries including abdominal hysterectomy with bilateral salpingo-oophorectomy, hernia repair, fracture of her femur on the right side, cholecystectomy, cardiac catheterization. She traumatized her right calf at 2 locations in November 2016. More recently fell and suffered a skin tear to her right hand (healed). Ambulating per her baseline. No significant pain. Right ABI 0.75. Performing dressing changes with silver alginate and using a Tubigrip for edema control. She returns to clinic for follow-up and is without complaints other than the silver alginate sticking to her wounds. No significant pain. No fever or chills. Minimal drainage. 01/30/16; this is an 82 year old woman who was previously been in this clinic on 2 occasions. In June 2016 she had a left leg wound and a right forearm wound. The lower extremity wound I think was caused by hitting her leg on the dishwasher door and the arm on the car door. She healed in 2-3 weeks with standard therapy. She was here in December 2016 with her traumatic wound to the right calf again this healed fairly quickly. She has an ABI on the right leg "it is 0.75. Today she is noncompressible. She does have some form of stocking but I don't think wears them reliably in I don't think there prescription stockings area she has a history of an Anka-positive vasculitis and is on prednisone. Her past medical history is reviewed. She does not complain of anything that sounds like claudication still an active woman who lives in an independent living setting about a month ago she tells me she hit her right anterior leg on a box. This caused a open wound. She is been treating this with Neosporin without improvement. She is not a diabetic. Her arterial ABI in this clinic was noncompressible the patient is been seen in this clinic 2 times before. However I was not involved in her care nor was any of the current physician  group in this clinic 02/06/16; good progress/Prisma. Has been for her arterial studies although we don't have those results Keefe, Kasidi W. ([id number]) Objective Constitutional Sitting or standing Blood Pressure is within target range for patient.. Slightly bradycardic. Respirations regular, non-labored and within target range.. Temperature is normal and within the target range for the patient.. Patient's appearance is neat and clean. Appears in no acute distress. Well nourished and well developed.. Vitals Time Taken: 8:04 AM, Height: 64 in, Weight: 128 lbs, BMI: 22, Temperature: 97.9 F, Pulse: 57 bpm, Respiratory Rate: 18 breaths/min, Blood Pressure: 133/80 mmHg. Eyes Conjunctivae clear. No discharge.Marland Kitchen Respiratory Respiratory effort is easy and symmetric bilaterally. Rate is normal at rest and on room air.. Cardiovascular Much the same as last week, pulses in the right foot are  not palpable although there is easily palpable in the left especially dorsalis pedis. Edema present in both extremities although the edema is mild. She clearly has hemosiderin deposition and some evidence of venous insufficiency. Lymphatic None palpable in the popliteal or inguinal areas. Psychiatric No evidence of depression, anxiety, or agitation. Calm, cooperative, and communicative. Appropriate interactions and affect.. General Notes: Wound exam; the wound is on the right anterior leg.. There is some surface slough year under the light although I did not debridement this. She has advancing epithelialization no evidence of surrounding infection. As noted pulses in her right foot are not palpable but easily palpable on the left. She has had her arterial studies Integumentary (Hair, Skin) Wound #6 status is Open. Original cause of wound was Trauma. The wound is located on the Right,Medial Lower Leg. The wound measures 1.5cm length x 2cm width x 0.1cm depth; 2.356cm^2 area and 0.236cm^3 volume. The wound is  limited to skin breakdown. There is no tunneling or undermining noted. There is a small amount of serous drainage noted. The wound margin is flat and intact. There is medium (34-66%) red granulation within the wound bed. There is a medium (34-66%) amount of necrotic tissue within the wound bed including Eschar. The periwound skin appearance exhibited: Moist, Hemosiderin Staining. The periwound skin appearance did not exhibit: Callus, Crepitus, Excoriation, Fluctuance, Dionisio David, Tague, Sukhman W. ([id number]) Induration, Localized Edema, Rash, Scarring, Dry/Scaly, Maceration, Atrophie Blanche, Cyanosis, Ecchymosis, Mottled, Pallor, Rubor, Erythema. Assessment Active Problems ICD-10 L97.211 - Non-pressure chronic ulcer of right calf limited to breakdown of skin I87.331 - Chronic venous hypertension (idiopathic) with ulcer and inflammation of right lower extremity I70.232 - Atherosclerosis of native arteries of right leg with ulceration of calf Plan Wound Cleansing: Wound #6 Right,Medial Lower Leg: Clean wound with Normal Saline. Anesthetic: Wound #6 Right,Medial Lower Leg: Topical Lidocaine 4% cream applied to wound bed prior to debridement Primary Wound Dressing: Wound #6 Right,Medial Lower Leg: Prisma Ag Secondary Dressing: Wound #6 Right,Medial Lower Leg: Boardered Foam Dressing Dressing Change Frequency: Wound #6 Right,Medial Lower Leg: Other: - patient to change once weekly (Sunday) Follow-up Appointments: Wound #6 Right,Medial Lower Leg: Return Appointment in 1 week. Edema Control: Wound #6 Right,Medial Lower Leg: Elevate legs to the level of the heart and pump ankles as often as possible Other: - Order compression stockings Additional Orders / Instructions: Wound #6 Right,Medial Lower Leg: Increase protein intake. Borowski, REHAM BLACKWATER. ([id number]) #1 we continued with the silver collagen, border foam dressing #2 we'll research the arterial studies before she returns next week  although she was told that everything was okay #3 the patient has significant venous insufficiency although at this point I am not planning to do reflux studies unless she develops significant wounds while wearing compression stockings. I discussed this with her at some length. Electronic Signature(s) Signed: 02/07/2016 7:30:59 AM By: Linton Ham MD Entered By: Linton Ham on 02/06/2016 08:35:54 Aigner, [name] ([id number]) -------------------------------------------------------------------------------- SuperBill Details Patient Name: [name] Date of Service: 02/06/2016 Medical Record Patient Account Number: [id number] Number: Treating RN: Cornell Barman [birth year] (82 y.o. 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

## 2016-02-08 NOTE — Progress Notes (Signed)
CHAZLYNN, CAAMANO ([id number]) Visit Report for 02/06/2016 Arrival Information Details Patient Name: [name], [name]. Date of Service: 02/06/2016 8:00 AM Medical Record Patient Account Number: [id number] Number: Treating RN: Cornell Barman 11-26-1928 (82 y.o. Other Clinician: Date of Birth/Sex: Female) Treating ROBSON, [place] Primary Care Physician: Caryl Bis, ERIC Physician/Extender: G Referring Physician: Caryl Bis ERIC Weeks in Treatment: 1 Visit Information History Since Last Visit Added or deleted any medications: No Patient Arrived: Ambulatory Any new allergies or adverse reactions: No Arrival Time: 08:03 Had a fall or experienced change in No Accompanied By: self activities of daily living that may affect Transfer Assistance: None risk of falls: Patient Identification Verified: Yes Signs or symptoms of abuse/neglect since last No Secondary Verification Process Yes visito Completed: Pain Present Now: No Patient Has Alerts: No Electronic Signature(s) Signed: 02/07/2016 4:09:14 PM By: Gretta Cool, RN, BSN, Kim RN, BSN Entered By: Gretta Cool, RN, BSN, Kim on 02/06/2016 08:04:47 Sarchet, [name] ([id number]) -------------------------------------------------------------------------------- Clinic Level of Care Assessment Details Patient Name: [name] Date of Service: 02/06/2016 8:00 AM Medical Record Patient Account Number: [id number] Number: Treating RN: Cornell Barman 1929-01-15 (82 y.o. Other Clinician: Date of Birth/Sex: Female) Treating ROBSON, [place] Primary Care Physician: Caryl Bis, ERIC Physician/Extender: G Referring Physician: Caryl Bis, ERIC Weeks in Treatment: 1 Clinic Level of Care Assessment Items TOOL 4 Quantity Score []  - Use when only an EandM is performed on FOLLOW-UP visit 0 ASSESSMENTS - Nursing Assessment / Reassessment []  - Reassessment of Co-morbidities (includes updates in patient status) 0 X - Reassessment of Adherence to  Treatment Plan 1 5 ASSESSMENTS - Wound and Skin Assessment / Reassessment X - Simple Wound Assessment / Reassessment - one wound 1 5 []  - Complex Wound Assessment / Reassessment - multiple wounds 0 []  - Dermatologic / Skin Assessment (not related to wound area) 0 ASSESSMENTS - Focused Assessment []  - Circumferential Edema Measurements - multi extremities 0 []  - Nutritional Assessment / Counseling / Intervention 0 []  - Lower Extremity Assessment (monofilament, tuning fork, pulses) 0 []  - Peripheral Arterial Disease Assessment (using hand held doppler) 0 ASSESSMENTS - Ostomy and/or Continence Assessment and Care []  - Incontinence Assessment and Management 0 []  - Ostomy Care Assessment and Management (repouching, etc.) 0 PROCESS - Coordination of Care X - Simple Patient / Family Education for ongoing care 1 15 []  - Complex (extensive) Patient / Family Education for ongoing care 0 []  - Staff obtains Programmer, systems, Records, Test Results / Process Orders 0 []  - Staff telephones HHA, Nursing Homes / Clarify orders / etc 0 [place], [place] W. ([id number]) []  - Routine Transfer to another Facility (non-emergent condition) 0 []  - Routine Hospital Admission (non-emergent condition) 0 []  - New Admissions / Biomedical engineer / Ordering NPWT, Apligraf, etc. 0 []  - Emergency Hospital Admission (emergent condition) 0 X - Simple Discharge Coordination 1 10 []  - Complex (extensive) Discharge Coordination 0 PROCESS - Special Needs []  - Pediatric / Minor Patient Management 0 []  - Isolation Patient Management 0 []  - Hearing / Language / Visual special needs 0 []  - Assessment of Community assistance (transportation, D/C planning, etc.) 0 []  - Additional assistance / Altered mentation 0 []  - Support Surface(s) Assessment (bed, cushion, seat, etc.) 0 INTERVENTIONS - Wound Cleansing / Measurement X - Simple Wound Cleansing - one wound 1 5 []  - Complex Wound Cleansing - multiple wounds 0 X - Wound Imaging  (photographs - any number of wounds) 1 5 []  - Wound Tracing (instead of photographs) 0 X - Simple Wound Measurement -  one wound 1 5 []  - Complex Wound Measurement - multiple wounds 0 INTERVENTIONS - Wound Dressings X - Small Wound Dressing one or multiple wounds 1 10 []  - Medium Wound Dressing one or multiple wounds 0 []  - Large Wound Dressing one or multiple wounds 0 []  - Application of Medications - topical 0 []  - Application of Medications - injection 0 Acheampong, Chanae W. ([id number]) INTERVENTIONS - Miscellaneous []  - External ear exam 0 []  - Specimen Collection (cultures, biopsies, blood, body fluids, etc.) 0 []  - Specimen(s) / Culture(s) sent or taken to Lab for analysis 0 []  - Patient Transfer (multiple staff / Harrel Lemon Lift / Similar devices) 0 []  - Simple Staple / Suture removal (25 or less) 0 []  - Complex Staple / Suture removal (26 or more) 0 []  - Hypo / Hyperglycemic Management (close monitor of Blood Glucose) 0 []  - Ankle / Brachial Index (ABI) - do not check if billed separately 0 X - Vital Signs 1 5 Has the patient been seen at the hospital within the last three years: Yes Total Score: 65 Level Of Care: New/Established - Level 2 Electronic Signature(s) Signed: 02/07/2016 4:09:14 PM By: Gretta Cool, RN, BSN, Kim RN, BSN Entered By: Gretta Cool, RN, BSN, Kim on 02/06/2016 08:21:54 Helbling, [name] ([id number]) -------------------------------------------------------------------------------- Encounter Discharge Information Details Patient Name: [name] Date of Service: 02/06/2016 8:00 AM Medical Record Patient Account Number: [id number] Number: Treating RN: Cornell Barman [birth year] (82 y.o. Other Clinician: Date of Birth/Sex: Female) Treating ROBSON, MICHAEL Primary Care Physician: Caryl Bis, ERIC Physician/Extender: G Referring Physician: Caryl Bis ERIC Weeks in Treatment: 1 Encounter Discharge Information Items Discharge Pain Level: 0 Discharge Condition:  Stable Ambulatory Status: Ambulatory Discharge Destination: Home Transportation: Private Auto Accompanied By: self Schedule Follow-up Appointment: Yes Medication Reconciliation completed and provided to Patient/Care Yes Cary Wilford: Provided on Clinical Summary of Care: 02/06/2016 Form Type Recipient Paper Patient NB Electronic Signature(s) Signed: 02/06/2016 8:29:56 AM By: Ruthine Dose Entered By: Ruthine Dose on 02/06/2016 08:29:56 Savich, [name] ([id number]) -------------------------------------------------------------------------------- Lower Extremity Assessment Details Patient Name: [name] Date of Service: 02/06/2016 8:00 AM Medical Record Patient Account Number: [id number] Number: Treating RN: Cornell Barman May 24, 1929 (82 y.o. Other Clinician: Date of Birth/Sex: Female) Treating ROBSON, [place] Primary Care Physician: Caryl Bis, ERIC Physician/Extender: G Referring Physician: Caryl Bis, ERIC Weeks in Treatment: 1 Edema Assessment Assessed: [Left: No] [Right: No] Edema: [Left: N] [Right: o] Calf Left: Right: Point of Measurement: cm From Medial Instep cm 33 cm Ankle Left: Right: Point of Measurement: cm From Medial Instep cm 21 cm Vascular Assessment Pulses: Posterior Tibial Dorsalis Pedis Palpable: [Left:Yes] [Right:Yes] Extremity colors, hair growth, and conditions: Extremity Color: [Left:Hyperpigmented] [Right:Hyperpigmented] Hair Growth on Extremity: [Left:Yes] [Right:Yes] Temperature of Extremity: [Left:Warm] [Right:Warm] Capillary Refill: [Left:< 3 seconds] [Right:< 3 seconds] Toe Nail Assessment Left: Right: Thick: No No Discolored: No No Deformed: No No Improper Length and Hygiene: No No Electronic Signature(s) Signed: 02/07/2016 4:09:14 PM By: Gretta Cool, RN, BSN, Kim RN, BSN Entered By: Gretta Cool, RN, BSN, Kim on 02/06/2016 10:12:36 Mesch, [name] ([id number]) Zollinger, [name]  ([id number]) -------------------------------------------------------------------------------- Multi Wound Chart Details Patient Name: [name] Date of Service: 02/06/2016 8:00 AM Medical Record Patient Account Number: [id number] Number: Treating RN: Cornell Barman [birth year] (82 y.o. Other Clinician: Date of Birth/Sex: Female) Treating ROBSON, MICHAEL Primary Care Physician: Caryl Bis, ERIC Physician/Extender: G Referring Physician: Caryl Bis, ERIC Weeks in Treatment: 1 Vital Signs Height(in): 64 Pulse(bpm): 57 Weight(lbs): 128 Blood Pressure 133/80 (mmHg): Body Mass Index(BMI): 22 Temperature(F): 97.9  Respiratory Rate 18 (breaths/min): Photos: [6:No Photos] [N/A:N/A] Wound Location: [6:Right Lower Leg - Medial] [N/A:N/A] Wounding Event: [6:Trauma] [N/A:N/A] Primary Etiology: [6:Trauma, Other] [N/A:N/A] Secondary Etiology: [6:Venous Leg Ulcer] [N/A:N/A] Comorbid History: [6:Cataracts, Hypertension, Vasculitis, Osteoarthritis] [N/A:N/A] Date Acquired: [6:12/30/2015] [N/A:N/A] Weeks of Treatment: [6:1] [N/A:N/A] Wound Status: [6:Open] [N/A:N/A] Measurements L x W x D 1.5x2x0.1 [N/A:N/A] (cm) Area (cm) : [6:2.356] [N/A:N/A] Volume (cm) : [6:0.236] [N/A:N/A] % Reduction in Area: [6:45.50%] [N/A:N/A] % Reduction in Volume: 45.40% [N/A:N/A] Classification: [6:Partial Thickness] [N/A:N/A] Exudate Amount: [6:Small] [N/A:N/A] Exudate Type: [6:Serous] [N/A:N/A] Exudate Color: [6:amber] [N/A:N/A] Wound Margin: [6:Flat and Intact] [N/A:N/A] Granulation Amount: [6:Medium (34-66%)] [N/A:N/A] Granulation Quality: [6:Red] [N/A:N/A] Necrotic Amount: [6:Medium (34-66%)] [N/A:N/A] Necrotic Tissue: [6:Eschar] [N/A:N/A] Exposed Structures: [6:Fascia: No Fat: No Tendon: No] [N/A:N/A] Muscle: No Joint: No Bone: No Limited to Skin Breakdown Epithelialization: None N/A N/A Periwound Skin Texture: Edema: No N/A N/A Excoriation: No Induration: No Callus:  No Crepitus: No Fluctuance: No Friable: No Rash: No Scarring: No Periwound Skin Moist: Yes N/A N/A Moisture: Maceration: No Dry/Scaly: No Periwound Skin Color: Hemosiderin Staining: Yes N/A N/A Atrophie Blanche: No Cyanosis: No Ecchymosis: No Erythema: No Mottled: No Pallor: No Rubor: No Tenderness on No N/A N/A Palpation: Wound Preparation: Ulcer Cleansing: N/A N/A Rinsed/Irrigated with Saline Topical Anesthetic Applied: Other: lidocaine 4% Treatment Notes Electronic Signature(s) Signed: 02/07/2016 4:09:14 PM By: Gretta Cool, RN, BSN, Kim RN, BSN Entered By: Gretta Cool, RN, BSN, Kim on 02/06/2016 08:17:40 Gagan, [name] ([id number]) -------------------------------------------------------------------------------- Multi-Disciplinary Care Plan Details Patient Name: [name] Date of Service: 02/06/2016 8:00 AM Medical Record Patient Account Number: [id number] Number: Treating RN: Cornell Barman 12-Jun-1929 (82 y.o. Other Clinician: Date of Birth/Sex: Female) Treating ROBSON, MICHAEL Primary Care Physician: Caryl Bis, ERIC Physician/Extender: G Referring Physician: Caryl Bis, ERIC Weeks in Treatment: 1 Active Inactive Orientation to the Wound Care Program Nursing Diagnoses: Knowledge deficit related to the wound healing center program Goals: Patient/caregiver will verbalize understanding of the [facility] Program Date Initiated: 01/30/2016 Goal Status: Active Interventions: Provide education on orientation to the wound center Notes: Venous Leg Ulcer Nursing Diagnoses: Potential for venous Insuffiency (use before diagnosis confirmed) Goals: Patient will maintain optimal edema control Date Initiated: 01/30/2016 Goal Status: Active Interventions: Assess peripheral edema status every visit. Notes: Wound/Skin Impairment Nursing Diagnoses: Impaired tissue integrity Goals: Starry, Tinesha W. ([id number]) Ulcer/skin breakdown will heal within 14  weeks Date Initiated: 01/30/2016 Goal Status: Active Interventions: Assess patient/caregiver ability to obtain necessary supplies Assess ulceration(s) every visit Notes: Electronic Signature(s) Signed: 02/07/2016 4:09:14 PM By: Gretta Cool, RN, BSN, Kim RN, BSN Entered By: Gretta Cool, RN, BSN, Kim on 02/06/2016 08:17:31 Kyte, [name] ([id number]) -------------------------------------------------------------------------------- Pain Assessment Details Patient Name: [name] Date of Service: 02/06/2016 8:00 AM Medical Record Patient Account Number: [id number] Number: Treating RN: Cornell Barman [birth year] (82 y.o. Other Clinician: Date of Birth/Sex: Female) Treating ROBSON, MICHAEL Primary Care Physician: Caryl Bis, ERIC Physician/Extender: G Referring Physician: Caryl Bis, ERIC Weeks in Treatment: 1 Active Problems Location of Pain Severity and Description of Pain Patient Has Paino No Site Locations With Dressing Change: No Pain Management and Medication Current Pain Management: Electronic Signature(s) Signed: 02/07/2016 4:09:14 PM By: Gretta Cool, RN, BSN, Kim RN, BSN Entered By: Gretta Cool, RN, BSN, Kim on 02/06/2016 08:04:55 Marian, [name] ([id number]) -------------------------------------------------------------------------------- Patient/Caregiver Education Details Patient Name: [name] Date of Service: 02/06/2016 8:00 AM Medical Record Patient Account Number: [id number] Number: Treating RN: Cornell Barman 1929/06/11 (82 y.o. Other Clinician: Date of Birth/Gender: Female) Treating ROBSON, MICHAEL Primary Care  Physician: Caryl Bis, ERIC Physician/Extender: G Referring Physician: Caryl Bis ERIC Weeks in Treatment: 1 Education Assessment Education Provided To: Patient Education Topics Provided Wound/Skin Impairment: Handouts: Caring for Your Ulcer, Other: continue wound care as prescribed Methods: Demonstration Responses: State content  correctly Electronic Signature(s) Signed: 02/07/2016 4:09:14 PM By: Gretta Cool, RN, BSN, Kim RN, BSN Entered By: Gretta Cool, RN, BSN, Kim on 02/06/2016 08:23:00 Laster, [name] ([id number]) -------------------------------------------------------------------------------- Wound Assessment Details Patient Name: [name] Date of Service: 02/06/2016 8:00 AM Medical Record Patient Account Number: [id number] Number: Treating RN: Cornell Barman 06-28-1929 (82 y.o. Other Clinician: Date of Birth/Sex: Female) Treating ROBSON, [place] Primary Care Physician: Caryl Bis, ERIC Physician/Extender: G Referring Physician: Caryl Bis, ERIC Weeks in Treatment: 1 Wound Status Wound Number: 6 Primary Trauma, Other Etiology: Wound Location: Right Lower Leg - Medial Secondary Venous Leg Ulcer Wounding Event: Trauma Etiology: Date Acquired: 12/30/2015 Wound Status: Open Weeks Of Treatment: 1 Comorbid Cataracts, Hypertension, Vasculitis, Clustered Wound: No History: Osteoarthritis Photos Wound Measurements Length: (cm) 1.5 % Reduction in Are Width: (cm) 2 % Reduction in Vol Depth: (cm) 0.1 Epithelialization: Area: (cm) 2.356 Tunneling: Volume: (cm) 0.236 Undermining: a: 45.5% ume: 45.4% None No No Wound Description Classification: Partial Thickness Wound Margin: Flat and Intact Exudate Amount: Small Exudate Type: Serous Exudate Color: amber Wound Bed Granulation Amount: Medium (34-66%) Exposed Structure Granulation Quality: Red Fascia Exposed: No Necrotic Amount: Medium (34-66%) Fat Layer Exposed: No Lehtinen, Jena W. ([id number]) Necrotic Quality: Eschar Tendon Exposed: No Muscle Exposed: No Joint Exposed: No Bone Exposed: No Limited to Skin Breakdown Periwound Skin Texture Texture Color No Abnormalities Noted: No No Abnormalities Noted: No Callus: No Atrophie Blanche: No Crepitus: No Cyanosis: No Excoriation: No Ecchymosis: No Fluctuance: No Erythema:  No Friable: No Hemosiderin Staining: Yes Induration: No Mottled: No Localized Edema: No Pallor: No Rash: No Rubor: No Scarring: No Moisture No Abnormalities Noted: No Dry / Scaly: No Maceration: No Moist: Yes Wound Preparation Ulcer Cleansing: Rinsed/Irrigated with Saline Topical Anesthetic Applied: Other: lidocaine 4%, Treatment Notes Wound #6 (Right, Medial Lower Leg) 1. Cleansed with: Clean wound with Normal Saline 2. Anesthetic Topical Lidocaine 4% cream to wound bed prior to debridement 4. Dressing Applied: Prisma Ag 5. Secondary Dressing Applied Bordered Foam Dressing 7. Secured with Patient to wear own compression stockings Electronic Signature(s) Signed: 02/07/2016 4:09:14 PM By: Gretta Cool, RN, BSN, Kim RN, BSN Entered By: Gretta Cool, RN, BSN, Kim on 02/06/2016 08:30:01 [place]es, [name] ([id number]) -------------------------------------------------------------------------------- Vitals Details Patient Name: [name] Date of Service: 02/06/2016 8:00 AM Medical Record Patient Account Number: [id number] Number: Treating RN: Cornell Barman Dec 28, 1928 (82 y.o. Other Clinician: Date of Birth/Sex: Female) Treating ROBSON, MICHAEL Primary Care Physician: Caryl Bis, ERIC Physician/Extender: G Referring Physician: Caryl Bis, ERIC Weeks in Treatment: 1 Vital Signs Time Taken: 08:04 Temperature (F): 97.9 Height (in): 64 Pulse (bpm): 57 Weight (lbs): 128 Respiratory Rate (breaths/min): 18 Body Mass Index (BMI): 22 Blood Pressure (mmHg): 133/80 Reference Range: 80 - 120 mg / dl Electronic Signature(s) Signed: 02/07/2016 4:09:14 PM By: Gretta Cool, RN, BSN, Kim RN, BSN Entered By: Gretta Cool, RN, BSN, Kim on 02/06/2016 08:05:12

## 2016-02-11 NOTE — Progress Notes (Signed)
Cardiology Office Note   Date:  02/11/2016   ID:  Aishia, [place] [birth year], MRN [id number]  PCP:  Tommi Rumps, MD  Cardiologist:   Kathlyn Sacramento, MD   Chief Complaint  Patient presents with  . other    C/o edema legs/feet. Notes from wound care. Meds reviewed verbally with pt.      History of Present Illness: RALYN BODLE is a 82 y.o. female who  Was referred by Dr. Con Memos for evaluation of nonhealing wound on the right mid lower leg which started in November 2016 with some worsening due to repeated trauma. She has multiple chronic medical conditions that include hypertension, vasculitis, chronic kidney disease and history of renal cell carcinoma status post surgery. She denies any claudication. She has no ulceration involving her toes. She does have chronic leg edema. She underwent lower extremity arterial Doppler which showed falsely elevated ABI due to calcified vessels. However, toe pressure was normal bilaterally. She sees Dr. Clayborn Bigness for palpitations and tachycardia.  Past Medical History  Diagnosis Date  . Arthritis   . GERD (gastroesophageal reflux disease)   . Hypertension   . Colon polyp   . UTI (lower urinary tract infection)   . Vasculitis ([place])     ANCA positive, pulmonary hemorrhage  . Thyroid disease 1957    hyperthyroid, had radioactiveiodine treatment   . Hemorrhoids   . Chronic kidney disease   . Renal cell carcinoma ([place]) 2003    s/p sgy for removal right kidney    Past Surgical History  Procedure Laterality Date  . Abdominal hysterectomy    . Bilateral salpingoophorectomy    . Hernia repair    . Fracture surgery Right     femur fracture  . Cholecystectomy  1957  . Nose surgery  2000  . Incontinence surgery  2006  . Colonoscopy w/ biopsies  May 27, 2011,01/10/14    tubular adenoma in the ascending colon and descending colon. Tubulovillous adenoma in the upper rectum 12 mm. Diverticulosis.  Marland Kitchen Upper gi endoscopy  May 30, 2004    large hiatal hernia, duodenal diverticulum.     Current Outpatient Prescriptions  Medication Sig Dispense Refill  . acetaminophen (RA ACETAMINOPHEN) 650 MG CR tablet Take by mouth.    . Cholecalciferol (VITAMIN D PO) Take by mouth daily.    . Cyanocobalamin (B-12) 2500 MCG TABS Take by mouth daily.    . ferrous sulfate 325 (65 FE) MG tablet Take 1 mg by mouth daily.     Marland Kitchen HYDROCHLOROTHIAZIDE PO Take by mouth daily.    . hydrocortisone-pramoxine (ANALPRAM HC) 2.5-1 % rectal cream Place 1 application rectally 3 (three) times daily. 30 g 2  . lisinopril (PRINIVIL,ZESTRIL) 10 MG tablet Take 10 mg by mouth daily.     . metoprolol tartrate (LOPRESSOR) 25 MG tablet Take 12.5 mg by mouth 2 (two) times daily.    . mometasone (NASONEX) 50 MCG/ACT nasal spray PLACE 2 SPRAYS INTO THE NOSE DAILY (Patient taking differently: PLACE 2 SPRAYS INTO THE NOSE AS NEEDED.) 17 g 1  . Multiple Vitamins-Minerals (IMMUNE SYSTEM BOOSTER PO) Take by mouth. Ambatrose (Immune System) 2 tabs daily    . multivitamin-lutein (OCUVITE-LUTEIN) CAPS capsule Take 1 capsule by mouth daily.    . polyethylene glycol (MIRALAX / GLYCOLAX) packet Take 17 g by mouth daily.    . potassium chloride SA (K-DUR,KLOR-CON) 20 MEQ tablet Take 20 mEq by mouth daily.    . predniSONE (DELTASONE) 5 MG  tablet Take 5 mg by mouth daily.    . Probiotic Product (PROBIOTIC DAILY PO) Take 1 tablet by mouth daily.    . traMADol (ULTRAM) 50 MG tablet Take 1 tablet (50 mg total) by mouth 2 (two) times daily as needed. 30 tablet 0  . vitamin E 400 UNIT capsule Take 400 Units by mouth daily.     No current facility-administered medications for this visit.    Allergies:   Amoxicillin; Nsaids; and Tolmetin    Social History:  The patient  reports that she has never smoked. She has never used smokeless tobacco. She reports that she does not drink alcohol or use illicit drugs.   Family History:  The patient's family history includes Arthritis in  her mother; Heart disease in her father, mother, and sister; Hypertension in her sister. There is no history of Breast cancer.    ROS:  Please see the history of present illness.   Otherwise, review of systems are positive for none.   All other systems are reviewed and negative.    PHYSICAL EXAM: VS:  BP 100/62 mmHg  Pulse 68  Ht 5\' 4"  (1.626 m)  Wt 125 lb 4 oz (56.813 kg)  BMI 21.49 kg/m2 , BMI Body mass index is 21.49 kg/(m^2). GEN: Well nourished, well developed, in no acute distress HEENT: normal Neck: no JVD, carotid bruits, or masses Cardiac: RRR; no murmurs, rubs, or gallops, mild bilateral edema. Respiratory:  clear to auscultation bilaterally, normal work of breathing GI: soft, nontender, nondistended, + BS MS: no deformity or atrophy Skin: warm and dry, no rash Neuro:  Strength and sensation are intact Psych: euthymic mood, full affect Vascular: Palpable distal pulses.  EKG:  EKG is not ordered today.    Recent Labs: 10/22/2015: BUN 26*; Creatinine, Ser 1.31*; Hemoglobin 12.4; Platelets 182; Potassium 3.5; Sodium 135    Lipid Panel No results found for: CHOL, TRIG, HDL, CHOLHDL, VLDL, LDLCALC, LDLDIRECT    Wt Readings from Last 3 Encounters:  02/07/16 125 lb 4 oz (56.813 kg)  01/21/16 125 lb 1.9 oz (56.754 kg)  12/20/15 126 lb 3.2 oz (57.244 kg)       ASSESSMENT AND PLAN:  1.  Right lower extremity ulceration: This is likely related to trauma with possible component of venous ulceration contributing to the slow healing. I don't think this is arterial in nature. Her toe pressures were normal. Continue wound care and leg elevation.  2. Essential hypertension: Blood pressure is controlled on current medications.    Disposition:   FU with me as needed.  Signed,  Kathlyn Sacramento, MD  02/11/2016 5:58 PM    [place]

## 2016-02-13 ENCOUNTER — Encounter: Payer: Medicare Other | Admitting: Nurse Practitioner

## 2016-02-13 DIAGNOSIS — L97211 Non-pressure chronic ulcer of right calf limited to breakdown of skin: Secondary | ICD-10-CM | POA: Diagnosis not present

## 2016-02-15 NOTE — Progress Notes (Signed)
[name] ([id number]) Visit Report for 02/13/2016 Arrival Information Details Patient Name: [name], [name]. Date of Service: 02/13/2016 2:15 PM Medical Record Number: [id number] Patient Account Number: [id number] Date of Birth/Sex: 03/03/1929 (82 y.o. Female) Treating RN: Cornell Barman Primary Care Physician: Caryl Bis, ERIC Other Clinician: Referring Physician: Caryl Bis, ERIC Treating Physician/Extender: Loistine Chance in Treatment: 2 Visit Information History Since Last Visit Added or deleted any medications: No Patient Arrived: Ambulatory Any new allergies or adverse reactions: No Arrival Time: 14:22 Had a fall or experienced change in No Accompanied By: self activities of daily living that may affect Transfer Assistance: None risk of falls: Patient Identification Verified: Yes Signs or symptoms of abuse/neglect since last No Secondary Verification Process Yes visito Completed: Hospitalized since last visit: No Patient Has Alerts: No Has Dressing in Place as Prescribed: Yes Pain Present Now: No Electronic Signature(s) Signed: 02/13/2016 5:14:33 PM By: Gretta Cool, RN, BSN, Kim RN, BSN Entered By: Gretta Cool, RN, BSN, Kim on 02/13/2016 14:22:29 [name] ([id number]) -------------------------------------------------------------------------------- Clinic Level of Care Assessment Details Patient Name: [name] Date of Service: 02/13/2016 2:15 PM Medical Record Number: [id number] Patient Account Number: [id number] Date of Birth/Sex: Aug 05, 1928 (82 y.o. Female) Treating RN: Cornell Barman Primary Care Physician: Caryl Bis, ERIC Other Clinician: Referring Physician: Caryl Bis, ERIC Treating Physician/Extender: Loistine Chance in Treatment: 2 Clinic Level of Care Assessment Items TOOL 4 Quantity Score []  - Use when only an EandM is performed on FOLLOW-UP visit 0 ASSESSMENTS - Nursing Assessment / Reassessment []  - Reassessment of Co-morbidities  (includes updates in patient status) 0 X - Reassessment of Adherence to Treatment Plan 1 5 ASSESSMENTS - Wound and Skin Assessment / Reassessment X - Simple Wound Assessment / Reassessment - one wound 1 5 []  - Complex Wound Assessment / Reassessment - multiple wounds 0 []  - Dermatologic / Skin Assessment (not related to wound area) 0 ASSESSMENTS - Focused Assessment []  - Circumferential Edema Measurements - multi extremities 0 []  - Nutritional Assessment / Counseling / Intervention 0 []  - Lower Extremity Assessment (monofilament, tuning fork, pulses) 0 []  - Peripheral Arterial Disease Assessment (using hand held doppler) 0 ASSESSMENTS - Ostomy and/or Continence Assessment and Care []  - Incontinence Assessment and Management 0 []  - Ostomy Care Assessment and Management (repouching, etc.) 0 PROCESS - Coordination of Care []  - Simple Patient / Family Education for ongoing care 0 []  - Complex (extensive) Patient / Family Education for ongoing care 0 []  - Staff obtains Programmer, systems, Records, Test Results / Process Orders 0 []  - Staff telephones HHA, Nursing Homes / Clarify orders / etc 0 []  - Routine Transfer to another Facility (non-emergent condition) 0 Kaeding, Chirstine W. ([id number]) []  - Routine Hospital Admission (non-emergent condition) 0 []  - New Admissions / Biomedical engineer / Ordering NPWT, Apligraf, etc. 0 []  - Emergency Hospital Admission (emergent condition) 0 X - Simple Discharge Coordination 1 10 []  - Complex (extensive) Discharge Coordination 0 PROCESS - Special Needs []  - Pediatric / Minor Patient Management 0 []  - Isolation Patient Management 0 []  - Hearing / Language / Visual special needs 0 []  - Assessment of Community assistance (transportation, D/C planning, etc.) 0 []  - Additional assistance / Altered mentation 0 []  - Support Surface(s) Assessment (bed, cushion, seat, etc.) 0 INTERVENTIONS - Wound Cleansing / Measurement []  - Simple Wound Cleansing - one wound  0 []  - Complex Wound Cleansing - multiple wounds 0 X - Wound Imaging (photographs - any number of wounds) 1 5 []  - Wound Tracing (instead  of photographs) 0 []  - Simple Wound Measurement - one wound 0 []  - Complex Wound Measurement - multiple wounds 0 INTERVENTIONS - Wound Dressings []  - Small Wound Dressing one or multiple wounds 0 []  - Medium Wound Dressing one or multiple wounds 0 []  - Large Wound Dressing one or multiple wounds 0 []  - Application of Medications - topical 0 []  - Application of Medications - injection 0 INTERVENTIONS - Miscellaneous []  - External ear exam 0 Barros, Sevilla W. ([id number]) []  - Specimen Collection (cultures, biopsies, blood, body fluids, etc.) 0 []  - Specimen(s) / Culture(s) sent or taken to Lab for analysis 0 []  - Patient Transfer (multiple staff / Harrel Lemon Lift / Similar devices) 0 []  - Simple Staple / Suture removal (25 or less) 0 []  - Complex Staple / Suture removal (26 or more) 0 []  - Hypo / Hyperglycemic Management (close monitor of Blood Glucose) 0 []  - Ankle / Brachial Index (ABI) - do not check if billed separately 0 X - Vital Signs 1 5 Has the patient been seen at the hospital within the last three years: Yes Total Score: 30 Level Of Care: New/Established - Level 1 Electronic Signature(s) Signed: 02/13/2016 5:14:33 PM By: Gretta Cool, RN, BSN, Kim RN, BSN Entered By: Gretta Cool, RN, BSN, Kim on 02/13/2016 14:33:40 [name] ([id number]) -------------------------------------------------------------------------------- Encounter Discharge Information Details Patient Name: [name] Date of Service: 02/13/2016 2:15 PM Medical Record Number: [id number] Patient Account Number: [id number] Date of Birth/Sex: 1929/04/22 (82 y.o. Female) Treating RN: Cornell Barman Primary Care Physician: Caryl Bis, ERIC Other Clinician: Referring Physician: Caryl Bis, ERIC Treating Physician/Extender: Loistine Chance in Treatment: 2 Encounter Discharge  Information Items Discharge Pain Level: 0 Discharge Condition: Stable Ambulatory Status: Ambulatory Discharge Destination: Home Transportation: Private Auto Accompanied By: self Schedule Follow-up Appointment: Yes Medication Reconciliation completed and provided to Patient/Care Yes Felissa Blouch: Provided on Clinical Summary of Care: 02/13/2016 Form Type Recipient Paper Patient NB Electronic Signature(s) Signed: 02/13/2016 2:45:34 PM By: Ruthine Dose Entered By: Ruthine Dose on 02/13/2016 14:45:33 Diiorio, Grayland Kelly ([id number]) -------------------------------------------------------------------------------- Lower Extremity Assessment Details Patient Name: [name] Date of Service: 02/13/2016 2:15 PM Medical Record Number: [id number] Patient Account Number: [id number] Date of Birth/Sex: 09-29-1928 (82 y.o. Female) Treating RN: Cornell Barman Primary Care Physician: Caryl Bis, ERIC Other Clinician: Referring Physician: Caryl Bis, ERIC Treating Physician/Extender: Loistine Chance in Treatment: 2 Vascular Assessment Pulses: Posterior Tibial Dorsalis Pedis Palpable: [Right:Yes] Extremity colors, hair growth, and conditions: Extremity Color: [Right:Normal] Hair Growth on Extremity: [Right:Yes] Temperature of Extremity: [Right:Warm] Capillary Refill: [Right:< 3 seconds] Toe Nail Assessment Left: Right: Thick: No Discolored: No Deformed: No Improper Length and Hygiene: No Electronic Signature(s) Signed: 02/13/2016 5:14:33 PM By: Gretta Cool, RN, BSN, Kim RN, BSN Entered By: Gretta Cool, RN, BSN, Kim on 02/13/2016 14:25:56 Flippo, Grayland Kelly ([id number]) -------------------------------------------------------------------------------- Multi Wound Chart Details Patient Name: [name] Date of Service: 02/13/2016 2:15 PM Medical Record Number: [id number] Patient Account Number: [id number] Date of Birth/Sex: [birth year] (82 y.o. Female) Treating RN: Cornell Barman Primary Care  Physician: Caryl Bis, ERIC Other Clinician: Referring Physician: Caryl Bis, ERIC Treating Physician/Extender: Loistine Chance in Treatment: 2 Vital Signs Height(in): 64 Pulse(bpm): 59 Weight(lbs): 128 Blood Pressure 120/64 (mmHg): Body Mass Index(BMI): 22 Temperature(F): 98.1 Respiratory Rate 18 (breaths/min): Photos: [N/A:N/A] Wound Location: Right Lower Leg - Medial N/A N/A Wounding Event: Trauma N/A N/A Primary Etiology: Trauma, Other N/A N/A Secondary Etiology: Venous Leg Ulcer N/A N/A Comorbid History: Cataracts, Hypertension, N/A N/A Vasculitis, Osteoarthritis Date Acquired: 12/30/2015 N/A N/A Weeks of Treatment:  2 N/A N/A Wound Status: Open N/A N/A Measurements L x W x D 0.1x0.2x0.1 N/A N/A (cm) Area (cm) : 0.016 N/A N/A Volume (cm) : 0.002 N/A N/A % Reduction in Area: 99.60% N/A N/A % Reduction in Volume: 99.50% N/A N/A Classification: Partial Thickness N/A N/A Exudate Amount: Small N/A N/A Exudate Type: Serous N/A N/A Exudate Color: amber N/A N/A Wound Margin: Flat and Intact N/A N/A Granulation Amount: Medium (34-66%) N/A N/A Granulation Quality: Red N/A N/A Mikowski, Jayonna W. ([id number]) Necrotic Amount: Medium (34-66%) N/A N/A Necrotic Tissue: Eschar N/A N/A Exposed Structures: Fascia: No N/A N/A Fat: No Tendon: No Muscle: No Joint: No Bone: No Limited to Skin Breakdown Epithelialization: None N/A N/A Periwound Skin Texture: Edema: No N/A N/A Excoriation: No Induration: No Callus: No Crepitus: No Fluctuance: No Friable: No Rash: No Scarring: No Periwound Skin Moist: Yes N/A N/A Moisture: Maceration: No Dry/Scaly: No Periwound Skin Color: Hemosiderin Staining: Yes N/A N/A Atrophie Blanche: No Cyanosis: No Ecchymosis: No Erythema: No Mottled: No Pallor: No Rubor: No Tenderness on No N/A N/A Palpation: Wound Preparation: Ulcer Cleansing: N/A N/A Rinsed/Irrigated with Saline Topical Anesthetic Applied: Other:  lidocaine 4% Treatment Notes Electronic Signature(s) Signed: 02/13/2016 5:14:33 PM By: Gretta Cool, RN, BSN, Kim RN, BSN Entered By: Gretta Cool, RN, BSN, Kim on 02/13/2016 14:27:30 Lwin, Grayland Kelly ([id number]) -------------------------------------------------------------------------------- [place] Details Patient Name: [name] Date of Service: 02/13/2016 2:15 PM Medical Record Number: [id number] Patient Account Number: [id number] Date of Birth/Sex: October 23, 1928 (82 y.o. Female) Treating RN: Cornell Barman Primary Care Physician: Caryl Bis, ERIC Other Clinician: Referring Physician: Caryl Bis, ERIC Treating Physician/Extender: Loistine Chance in Treatment: 2 Active Inactive Electronic Signature(s) Signed: 02/14/2016 1:34:16 PM By: Gretta Cool RN, BSN, Kim RN, BSN Previous Signature: 02/13/2016 5:14:33 PM Version By: Gretta Cool RN, BSN, Kim RN, BSN Entered By: Gretta Cool, RN, BSN, Kim on 02/14/2016 12:57:06 Gelber, Grayland Kelly ([id number]) -------------------------------------------------------------------------------- Pain Assessment Details Patient Name: [name] Date of Service: 02/13/2016 2:15 PM Medical Record Number: [id number] Patient Account Number: [id number] Date of Birth/Sex: 1929-04-01 (82 y.o. Female) Treating RN: Cornell Barman Primary Care Physician: Caryl Bis, ERIC Other Clinician: Referring Physician: Caryl Bis, ERIC Treating Physician/Extender: Loistine Chance in Treatment: 2 Active Problems Location of Pain Severity and Description of Pain Patient Has Paino No Site Locations With Dressing Change: No Pain Management and Medication Current Pain Management: Electronic Signature(s) Signed: 02/13/2016 5:14:33 PM By: Gretta Cool, RN, BSN, Kim RN, BSN Entered By: Gretta Cool, RN, BSN, Kim on 02/13/2016 14:22:52 Batt, Grayland Kelly ([id number]) -------------------------------------------------------------------------------- Patient/Caregiver Education  Details Patient Name: [name] Date of Service: 02/13/2016 2:15 PM Medical Record Number: [id number] Patient Account Number: [id number] Date of Birth/Gender: March 10, 1929 (82 y.o. Female) Treating RN: Cornell Barman Primary Care Physician: Caryl Bis, ERIC Other Clinician: Referring Physician: Caryl Bis, ERIC Treating Physician/Extender: Loistine Chance in Treatment: 2 Education Assessment Education Provided To: Patient Education Topics Provided Electronic Signature(s) Signed: 02/13/2016 5:14:33 PM By: Gretta Cool RN, BSN, Kim RN, BSN Entered By: Gretta Cool, RN, BSN, Kim on 02/13/2016 14:38:25 Broxton, Grayland Kelly ([id number]) -------------------------------------------------------------------------------- Wound Assessment Details Patient Name: [name] Date of Service: 02/13/2016 2:15 PM Medical Record Number: [id number] Patient Account Number: [id number] Date of Birth/Sex: 1928-12-16 (82 y.o. Female) Treating RN: Cornell Barman Primary Care Physician: Caryl Bis, ERIC Other Clinician: Referring Physician: Caryl Bis, ERIC Treating Physician/Extender: Loistine Chance in Treatment: 2 Wound Status Wound Number: 6 Primary Trauma, Other Etiology: Wound Location: Right, Medial Lower Leg Secondary Venous Leg Ulcer Wounding Event: Trauma Etiology: Date Acquired: 12/30/2015 Wound Status: Healed -  Epithelialized Weeks Of Treatment: 2 Comorbid Cataracts, Hypertension, Vasculitis, Clustered Wound: No History: Osteoarthritis Photos Wound Measurements Length: (cm) 0 % Reduction Width: (cm) 0 % Reduction Depth: (cm) 0 Epithelializ Area: (cm) 0 Tunneling: Volume: (cm) 0 Undermining in Area: 100% in Volume: 100% ation: None No : No Wound Description Classification: Partial Thickness Wound Margin: Flat and Intact Exudate Amount: Small Exudate Type: Serous Exudate Color: amber Wound Bed Granulation Amount: Medium (34-66%) Exposed Structure Granulation Quality:  Red Fascia Exposed: No Necrotic Amount: Medium (34-66%) Fat Layer Exposed: No Necrotic Quality: Eschar Tendon Exposed: No Muscle Exposed: No Ruffini, Dinisha W. ([id number]) Joint Exposed: No Bone Exposed: No Limited to Skin Breakdown Periwound Skin Texture Texture Color No Abnormalities Noted: No No Abnormalities Noted: No Callus: No Atrophie Blanche: No Crepitus: No Cyanosis: No Excoriation: No Ecchymosis: No Fluctuance: No Erythema: No Friable: No Hemosiderin Staining: Yes Induration: No Mottled: No Localized Edema: No Pallor: No Rash: No Rubor: No Scarring: No Moisture No Abnormalities Noted: No Dry / Scaly: No Maceration: No Moist: Yes Wound Preparation Ulcer Cleansing: Rinsed/Irrigated with Saline Topical Anesthetic Applied: Other: lidocaine 4%, Electronic Signature(s) Signed: 02/13/2016 5:14:33 PM By: Gretta Cool, RN, BSN, Kim RN, BSN Entered By: Gretta Cool, RN, BSN, Kim on 02/13/2016 14:33:06 Hoar, Grayland Kelly ([id number]) -------------------------------------------------------------------------------- Vitals Details Patient Name: [name] Date of Service: 02/13/2016 2:15 PM Medical Record Number: [id number] Patient Account Number: [id number] Date of Birth/Sex: 02-09-1929 (82 y.o. Female) Treating RN: Cornell Barman Primary Care Physician: Caryl Bis, ERIC Other Clinician: Referring Physician: Caryl Bis, ERIC Treating Physician/Extender: Loistine Chance in Treatment: 2 Vital Signs Time Taken: 14:22 Temperature (F): 98.1 Height (in): 64 Pulse (bpm): 59 Weight (lbs): 128 Respiratory Rate (breaths/min): 18 Body Mass Index (BMI): 22 Blood Pressure (mmHg): 120/64 Reference Range: 80 - 120 mg / dl Electronic Signature(s) Signed: 02/13/2016 5:14:33 PM By: Gretta Cool, RN, BSN, Kim RN, BSN Entered By: Gretta Cool, RN, BSN, Kim on 02/13/2016 14:23:09

## 2016-02-15 NOTE — Progress Notes (Signed)
[name], [name] ([id number]) Visit Report for 02/13/2016 Chief Complaint Document Details Patient Name: [name], [name]. Date of Service: 02/13/2016 2:15 PM Medical Record Number: [id number] Patient Account Number: [id number] Date of Birth/Sex: [birth year] (82 y.o. Female) Treating RN: Cornell Barman Primary Care Physician: Caryl Bis, ERIC Other Clinician: Referring Physician: Caryl Bis, ERIC Treating Physician/Extender: Loistine Chance in Treatment: 2 Information Obtained from: Patient Chief Complaint Right calf traumatic ulceration. Right hand skin tear (healed). 01/30/16; patient returns to clinic with a wound on her right anterior leg that is been present for a month. Electronic Signature(s) Signed: 02/14/2016 5:22:48 PM By: Londell Moh FNP Entered By: Londell Moh on 02/13/2016 15:36:17 [name], [name] ([id number]) -------------------------------------------------------------------------------- HPI Details Patient Name: [name] Date of Service: 02/13/2016 2:15 PM Medical Record Number: [id number] Patient Account Number: [id number] Date of Birth/Sex: 04-02-1929 (82 y.o. Female) Treating RN: Cornell Barman Primary Care Physician: Caryl Bis, ERIC Other Clinician: Referring Physician: Caryl Bis, ERIC Treating Physician/Extender: Loistine Chance in Treatment: 2 History of Present Illness HPI Description: Pleasant 82 year old with history of Arthritis;GERD (gastroesophageal reflux disease);Hypertension;Chronic kidney diseaseo;Vasculitis, ANCA positive, pulmonary hemorrhage;Thyroid disease;hyperthyroid, had radioactiveiodine treatment;Renal cell carcinoma 2003 s/p sgy for removal right kidney;Hemorrhoids.o She has also had several surgeries including abdominal hysterectomy with bilateral salpingo-oophorectomy, hernia repair, fracture of her femur on the right side, cholecystectomy, cardiac catheterization. She traumatized her right calf at 2 locations in  November 2016. More recently fell and suffered a skin tear to her right hand (healed). Ambulating per her baseline. No significant pain. Right ABI 0.75. Performing dressing changes with silver alginate and using a Tubigrip for edema control. She returns to clinic for follow-up and is without complaints other than the silver alginate sticking to her wounds. No significant pain. No fever or chills. Minimal drainage. 01/30/16; this is an 82 year old woman who was previously been in this clinic on 2 occasions. In June 2016 she had a left leg wound and a right forearm wound. The lower extremity wound I think was caused by hitting her leg on the dishwasher door and the arm on the car door. She healed in 2-3 weeks with standard therapy. She was here in December 2016 with her traumatic wound to the right calf again this healed fairly quickly. She has an ABI on the right leg "it is 0.75. Today she is noncompressible. She does have some form of stocking but I don't think wears them reliably in I don't think there prescription stockings area she has a history of an Anka-positive vasculitis and is on prednisone. Her past medical history is reviewed. She does not complain of anything that sounds like claudication still an active woman who lives in an independent living setting about a month ago she tells me she hit her right anterior leg on a box. This caused a open wound. She is been treating this with Neosporin without improvement. She is not a diabetic. Her arterial ABI in this clinic was noncompressible the patient is been seen in this clinic 2 times before. However I was not involved in her care nor was any of the current physician group in this clinic 02/06/16; good progress/Prisma. Has been for her arterial studies although we don't have those results 02/13/16: right lower leg wound has achieved complete epithelialization. Reviewed vascular report that noted nl toe pressures. Electronic  Signature(s) Signed: 02/14/2016 5:22:48 PM By: Londell Moh FNP Entered By: Londell Moh on 02/13/2016 15:38:52 [name], [name] ([id number]) -------------------------------------------------------------------------------- Physical Exam Details Patient Name: [name] Date of Service: 02/13/2016  2:15 PM Medical Record Number: [id number] Patient Account Number: [id number] Date of Birth/Sex: August 25, 1928 (82 y.o. Female) Treating RN: Cornell Barman Primary Care Physician: Caryl Bis, ERIC Other Clinician: Referring Physician: Caryl Bis, ERIC Treating Physician/Extender: Loistine Chance in Treatment: 2 Constitutional Patient's appearance is neat and clean. Appears in no acute distress. Well nourished and well developed.. Ears, Nose, Mouth, and Throat Patient can hear normal speaking tones without difficulty.Marland Kitchen Respiratory Respiratory effort is easy and symmetric bilaterally. Rate is normal at rest and on room air.. Cardiovascular 1 + palpable pedal pulses right foot.. Integumentary (Hair, Skin) wound has achieved complete epithelialization.Marland Kitchen Psychiatric Judgement and insight intact.. Alert and oriented times 3.. Short and long term memory intact.. No evidence of depression, anxiety, or agitation. Calm, cooperative, and communicative. Appropriate interactions and affect.. Electronic Signature(s) Signed: 02/14/2016 5:22:48 PM By: Londell Moh FNP Entered By: Londell Moh on 02/13/2016 15:38:14 [name], [name] ([id number]) -------------------------------------------------------------------------------- Physician Orders Details Patient Name: [name] Date of Service: 02/13/2016 2:15 PM Medical Record Number: [id number] Patient Account Number: [id number] Date of Birth/Sex: [birth year] (82 y.o. Female) Treating RN: Cornell Barman Primary Care Physician: Caryl Bis, ERIC Other Clinician: Referring Physician: Caryl Bis, ERIC Treating Physician/Extender:  Loistine Chance in Treatment: 2 Verbal / Phone Orders: Yes Clinician: Cornell Barman Read Back and Verified: Yes Diagnosis Coding Wound Cleansing Wound #6 Right,Medial Lower Leg o Clean wound with Normal Saline. Discharge From [facility] Wound #6 Right,Medial Lower Leg o Discharge from [place] Signature(s) Signed: 02/13/2016 5:14:33 PM By: Gretta Cool RN, BSN, Kim RN, BSN Signed: 02/14/2016 5:22:48 PM By: Londell Moh FNP Entered By: Gretta Cool RN, BSN, Kim on 02/13/2016 14:32:48 [name], [name] ([id number]) -------------------------------------------------------------------------------- Problem List Details Patient Name: [name] Date of Service: 02/13/2016 2:15 PM Medical Record Number: [id number] Patient Account Number: [id number] Date of Birth/Sex: 1928/09/24 (82 y.o. 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 01/30/2016 Yes breakdown of skin I87.331 Chronic venous hypertension (idiopathic) with ulcer and 01/30/2016 Yes inflammation of right lower extremity I70.232 Atherosclerosis of native arteries of right leg with 01/30/2016 Yes ulceration of calf Inactive Problems Resolved Problems Electronic Signature(s) Signed: 02/14/2016 5:22:48 PM By: Londell Moh FNP Entered By: Londell Moh on 02/13/2016 15:36:09 [name], [name] ([id number]) -------------------------------------------------------------------------------- Progress Note Details Patient Name: [name] Date of Service: 02/13/2016 2:15 PM Medical Record Number: [id number] Patient Account Number: [id number] Date of Birth/Sex: May 09, 1929 (82 y.o. Female) Treating RN: Cornell Barman Primary  Care Physician: Caryl Bis, ERIC Other Clinician: Referring Physician: Caryl Bis, ERIC Treating Physician/Extender: Loistine Chance in Treatment: 2 Subjective Chief Complaint Information obtained from Patient Right calf traumatic ulceration. Right hand skin tear (healed). 01/30/16; patient returns to clinic with a wound on her right anterior leg that is been present for a month. History of Present Illness (HPI) Pleasant 82 year old with history of Arthritis;GERD (gastroesophageal reflux disease);Hypertension;Chronic kidney disease ;Vasculitis, ANCA positive, pulmonary hemorrhage;Thyroid disease;hyperthyroid, had radioactiveiodine treatment;Renal cell carcinoma 2003 s/p sgy for removal right kidney;Hemorrhoids. She has also had several surgeries including abdominal hysterectomy with bilateral salpingo-oophorectomy, hernia repair, fracture of her femur on the right side, cholecystectomy, cardiac catheterization. She traumatized her right calf at 2 locations in November 2016. More recently fell and suffered a skin tear to her right hand (healed). Ambulating per her baseline. No significant pain. Right ABI 0.75. Performing  dressing changes with silver alginate and using a Tubigrip for edema control. She returns to clinic for follow-up and is without complaints other than the silver alginate sticking to her wounds. No significant pain. No fever or chills. Minimal drainage. 01/30/16; this is an 82 year old woman who was previously been in this clinic on 2 occasions. In June 2016 she had a left leg wound and a right forearm wound. The lower extremity wound I think was caused by hitting her leg on the dishwasher door and the arm on the car door. She healed in 2-3 weeks with standard therapy. She was here in December 2016 with her traumatic wound to the right calf again this healed fairly quickly. She has an ABI on the right leg "it is 0.75. Today she is noncompressible. She does have  some form of stocking but I don't think wears them reliably in I don't think there prescription stockings area she has a history of an Anka-positive vasculitis and is on prednisone. Her past medical history is reviewed. She does not complain of anything that sounds like claudication still an active woman who lives in an independent living setting about a month ago she tells me she hit her right anterior leg on a box. This caused a open wound. She is been treating this with Neosporin without improvement. She is not a diabetic. Her arterial ABI in this clinic was noncompressible the patient is been seen in this clinic 2 times before. However I was not involved in her care nor was any of the current physician group in this clinic 02/06/16; good progress/Prisma. Has been for her arterial studies although we don't have those results 02/13/16: right lower leg wound has achieved complete epithelialization. Reviewed vascular report that noted nl toe pressures. [name], Suzanne W. ([id number]) Objective Constitutional Patient's appearance is neat and clean. Appears in no acute distress. Well nourished and well developed.. Vitals Time Taken: 2:22 PM, Height: 64 in, Weight: 128 lbs, BMI: 22, Temperature: 98.1 F, Pulse: 59 bpm, Respiratory Rate: 18 breaths/min, Blood Pressure: 120/64 mmHg. Ears, Nose, Mouth, and Throat Patient can hear normal speaking tones without difficulty.Marland Kitchen Respiratory Respiratory effort is easy and symmetric bilaterally. Rate is normal at rest and on room air.. Cardiovascular 1 + palpable pedal pulses right foot.Marland Kitchen Psychiatric Judgement and insight intact.. Alert and oriented times 3.. Short and long term memory intact.. No evidence of depression, anxiety, or agitation. Calm, cooperative, and communicative. Appropriate interactions and affect.. Integumentary (Hair, Skin) wound has achieved complete epithelialization.. Wound #6 status is Healed - Epithelialized. Original cause of  wound was Trauma. The wound is located on the Right,Medial Lower Leg. The wound measures 0cm length x 0cm width x 0cm depth; 0cm^2 area and 0cm^3 volume. The wound is limited to skin breakdown. There is no tunneling or undermining noted. There is a small amount of serous drainage noted. The wound margin is flat and intact. There is medium (34-66%) red granulation within the wound bed. There is a medium (34-66%) amount of necrotic tissue within the wound bed including Eschar. The periwound skin appearance exhibited: Moist, Hemosiderin Staining. The periwound skin appearance did not exhibit: Callus, Crepitus, Excoriation, Fluctuance, Friable, Induration, Localized Edema, Rash, Scarring, Dry/Scaly, Maceration, Atrophie Blanche, Cyanosis, Ecchymosis, Mottled, Pallor, Rubor, Erythema. Assessment [name], Suzanne SVETLIK. ([id number]) Active Problems ICD-10 [phone] - Non-pressure chronic ulcer of right calf limited to breakdown of skin I87.331 - Chronic venous hypertension (idiopathic) with ulcer and inflammation of right lower extremity I70.232 - Atherosclerosis of native arteries of right  leg with ulceration of calf Plan Wound Cleansing: Wound #6 Right,Medial Lower Leg: Clean wound with Normal Saline. Discharge From [facility] Services: Wound #6 Right,Medial Lower Leg: Discharge from [place]: A follow-up appointment should be scheduled. Medication Reconciliation completed and provided to Patient/Care Provider. A Patient Clinical Summary of Care was provided to NB 1. discussed post epithelialized care. Electronic Signature(s) Signed: 02/14/2016 5:22:48 PM By: Londell Moh FNP Entered By: Londell Moh on 02/13/2016 15:39:28 [name], [name] ([id number]) -------------------------------------------------------------------------------- SuperBill Details Patient Name: [name] Date of Service: 02/13/2016 Medical Record Number: [id number] Patient Account  Number: [id number] Date of Birth/Sex: [birth year] (82 y.o. 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 Facility Procedures CPT4 Code: [id number] Description: [phone] - WOUND CARE VISIT-LEV 1 EST PT Modifier: Quantity: 1 Physician Procedures CPT4: Description Modifier Quantity Code [id number] - WC PHYS LEVEL 2 - EST PT 1 ICD-10 Description Diagnosis L97.211 Non-pressure chronic ulcer of right calf limited to breakdown of skin I87.331 Chronic venous hypertension (idiopathic) with ulcer and  inflammation of right lower extremity I70.232 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

## 2016-02-21 ENCOUNTER — Encounter: Payer: Medicare Other | Admitting: Surgery

## 2016-02-21 DIAGNOSIS — L97211 Non-pressure chronic ulcer of right calf limited to breakdown of skin: Secondary | ICD-10-CM | POA: Diagnosis not present

## 2016-02-22 NOTE — Progress Notes (Signed)
ANGILEE, WOOLDRIDGE ([id number]) Visit Report for 02/21/2016 Chief Complaint Document Details Patient Name: [name], [name] 02/21/2016 10:45 Date of Service: AM Medical Record [id number] Number: Patient Account Number: [id number] July 25, 1929 (82 y.o. Treating RN: Baruch Gouty, RN, BSN, Velva Harman Date of Birth/Sex: Female) Other Clinician: Primary Care Physician: Caryl Bis, ERIC Treating Clennon Nasca Referring Physician: Caryl Bis, ERIC Physician/Extender: Suella Grove in Treatment: 3 Information Obtained from: Patient Chief Complaint Right calf traumatic ulceration. Right hand skin tear (healed). 01/30/16; patient returns to clinic with a wound on her right anterior leg that is been present for a month. Electronic Signature(s) Signed: 02/21/2016 11:08:49 AM By: Christin Fudge MD, FACS Entered By: Christin Fudge on 02/21/2016 11:08:49 [name], [name] ([id number]) -------------------------------------------------------------------------------- HPI Details Patient Name: [name], [name] 02/21/2016 10:45 Date of Service: AM Medical Record [id number] Number: Patient Account Number: [id number] 05-Nov-1928 (82 y.o. Treating RN: Baruch Gouty, RN, BSN, Velva Harman Date of Birth/Sex: Female) Other Clinician: Primary Care Physician: Caryl Bis, ERIC Treating Fynlee Rowlands Referring Physician: Caryl Bis, ERIC Physician/Extender: Suella Grove in Treatment: 3 History of Present Illness HPI Description: Pleasant 82 year old with history of Arthritis;GERD (gastroesophageal reflux disease);Hypertension;Chronic kidney diseaseo;Vasculitis, ANCA positive, pulmonary hemorrhage;Thyroid disease;hyperthyroid, had radioactiveiodine treatment;Renal cell carcinoma 2003 s/p sgy for removal right kidney;Hemorrhoids.o She has also had several surgeries including abdominal hysterectomy with bilateral salpingo-oophorectomy, hernia repair, fracture of her femur on the right side, cholecystectomy, cardiac catheterization. She traumatized her right  calf at 2 locations in November 2016. More recently fell and suffered a skin tear to her right hand (healed). Ambulating per her baseline. No significant pain. Right ABI 0.75. Performing dressing changes with silver alginate and using a Tubigrip for edema control. She returns to clinic for follow-up and is without complaints other than the silver alginate sticking to her wounds. No significant pain. No fever or chills. Minimal drainage. 01/30/16; this is an 82 year old woman who was previously been in this clinic on 2 occasions. In June 2016 she had a left leg wound and a right forearm wound. The lower extremity wound I think was caused by hitting her leg on the dishwasher door and the arm on the car door. She healed in 2-3 weeks with standard therapy. She was here in December 2016 with her traumatic wound to the right calf again this healed fairly quickly. She has an ABI on the right leg "it is 0.75. Today she is noncompressible. She does have some form of stocking but I don't think wears them reliably in I don't think there prescription stockings area she has a history of an Anka-positive vasculitis and is on prednisone. Her past medical history is reviewed. She does not complain of anything that sounds like claudication still an active woman who lives in an independent living setting about a month ago she tells me she hit her right anterior leg on a box. This caused a open wound. She is been treating this with Neosporin without improvement. She is not a diabetic. Her arterial ABI in this clinic was noncompressible the patient is been seen in this clinic 2 times before. However I was not involved in her care nor was any of the current physician group in this clinic 02/06/16; good progress/Prisma. Has been for her arterial studies although we don't have those results 02/13/16: right lower leg wound has achieved complete epithelialization. Reviewed vascular report that noted nl toe  pressures. 02/21/2016 -- patient was recently discharged from the wound center week ago and came in today saying that she has pain in her leg especially in the area where  she has the wound. The wound has not opened and there is no evidence of any redness or swelling. [name], [name] ([id number]) Electronic Signature(s) Signed: 02/21/2016 11:09:36 AM By: Christin Fudge MD, FACS Entered By: Christin Fudge on 02/21/2016 11:09:36 [name], [name] ([id number]) -------------------------------------------------------------------------------- Physical Exam Details Patient Name: [name], [name] 02/21/2016 10:45 Date of Service: AM Medical Record [id number] Number: Patient Account Number: [id number] Oct 11, 1928 (82 y.o. Treating RN: Baruch Gouty, RN, BSN, Velva Harman Date of Birth/Sex: Female) Other Clinician: Primary Care Physician: Caryl Bis, ERIC Treating Demontez Novack Referring Physician: Caryl Bis, ERIC Physician/Extender: Weeks in Treatment: 3 Constitutional . Pulse regular. Respirations normal and unlabored. Afebrile. . Eyes Nonicteric. Reactive to light. Ears, Nose, Mouth, and Throat Lips, teeth, and gums WNL.Marland Kitchen Moist mucosa without lesions. Neck supple and nontender. No palpable supraclavicular or cervical adenopathy. Normal sized without goiter. Respiratory WNL. No retractions.. Cardiovascular Pedal Pulses WNL. No clubbing, cyanosis or edema. Lymphatic No adneopathy. No adenopathy. No adenopathy. Musculoskeletal Adexa without tenderness or enlargement.. Digits and nails w/o clubbing, cyanosis, infection, petechiae, ischemia, or inflammatory conditions.. Integumentary (Hair, Skin) No suspicious lesions. No crepitus or fluctuance. No peri-wound warmth or erythema. No masses.Marland Kitchen Psychiatric Judgement and insight Intact.. No evidence of depression, anxiety, or agitation.. Notes there is +1 pitting edema of the right lower extremity but there is no evidence of maceration opening of the wound  and there is no cellulitis. Peripheral pulses are palpable. Electronic Signature(s) Signed: 02/21/2016 11:10:08 AM By: Christin Fudge MD, FACS Entered By: Christin Fudge on 02/21/2016 11:10:08 Kromer, [name] ([id number]) -------------------------------------------------------------------------------- Physician Orders Details Patient Name: [name], [name] 02/21/2016 10:45 Date of Service: AM Medical Record [id number] Number: Patient Account Number: [id number] 1929/02/18 (82 y.o. Treating RN: Baruch Gouty, RN, BSN, Velva Harman Date of Birth/Sex: Female) Other Clinician: Primary Care Physician: Caryl Bis, ERIC Treating Kedric Bumgarner Referring Physician: Caryl Bis, ERIC Physician/Extender: Suella Grove in Treatment: 3 Verbal / Phone Orders: Yes Clinician: Afful, RN, BSN, Rita Read Back and Verified: Yes Diagnosis Coding Edema Control o Patient to wear own compression stockings - Wear first thing in the morning and wear it all day. Only remove when you going to sleep. o Elevate legs to the level of the heart and pump ankles as often as possible Discharge From [facility] o Discharge from [facility] Signature(s) Signed: 02/21/2016 3:37:03 PM By: Regan Lemming BSN, RN Signed: 02/21/2016 4:28:14 PM By: Christin Fudge MD, FACS Entered By: Regan Lemming on 02/21/2016 11:08:30 Tavares, [name] ([id number]) -------------------------------------------------------------------------------- Problem List Details Patient Name: [name], [name] 02/21/2016 10:45 Date of Service: AM Medical Record [id number] Number: Patient Account Number: [id number] [birth year] (82 y.o. Treating RN: Date of Birth/Sex: Female) Other Clinician: Primary Care Physician: Caryl Bis, ERIC Treating Referring Physician: Caryl Bis, ERIC Physician/Extender: Suella Grove in Treatment: 3 Active Problems Inactive Problems Resolved Problems Electronic Signature(s) Signed: 02/21/2016 11:08:42 AM By: Christin Fudge MD, FACS Entered By: Christin Fudge on 02/21/2016 11:08:42 Lesser, [name] ([id number]) -------------------------------------------------------------------------------- Progress Note Details Patient Name: Suzanne Manges. 02/21/2016 10:45 Date of Service: AM Medical Record [id number] Number: Patient Account Number: [id number] 1928-10-11 (82 y.o. Treating RN: Baruch Gouty, RN, BSN, Velva Harman Date of Birth/Sex: Female) Other Clinician: Primary Care Physician: Caryl Bis, ERIC Treating Alejandra Hunt Referring Physician: Caryl Bis, ERIC Physician/Extender: Suella Grove in Treatment: 3 Subjective Chief Complaint Information obtained from Patient Right calf traumatic ulceration. Right hand skin tear (healed). 01/30/16; patient returns to clinic with a wound on her right anterior leg that is been present for a month. History of Present Illness (HPI) Pleasant 82 year old  with history of Arthritis;GERD (gastroesophageal reflux disease);Hypertension;Chronic kidney disease ;Vasculitis, ANCA positive, pulmonary hemorrhage;Thyroid disease;hyperthyroid, had radioactiveiodine treatment;Renal cell carcinoma 2003 s/p sgy for removal right kidney;Hemorrhoids. She has also had several surgeries including abdominal hysterectomy with bilateral salpingo-oophorectomy, hernia repair, fracture of her femur on the right side, cholecystectomy, cardiac catheterization. She traumatized her right calf at 2 locations in November 2016. More recently fell and suffered a skin tear to her right hand (healed). Ambulating per her baseline. No significant pain. Right ABI 0.75. Performing dressing changes with silver alginate and using a Tubigrip for edema control. She returns to clinic for follow-up and is without complaints other than the silver alginate sticking to her wounds. No significant pain. No fever or chills. Minimal drainage. 01/30/16; this is an 82 year old woman who was previously been in this clinic on 2 occasions. In  June 2016 she had a left leg wound and a right forearm wound. The lower extremity wound I think was caused by hitting her leg on the dishwasher door and the arm on the car door. She healed in 2-3 weeks with standard therapy. She was here in December 2016 with her traumatic wound to the right calf again this healed fairly quickly. She has an ABI on the right leg "it is 0.75. Today she is noncompressible. She does have some form of stocking but I don't think wears them reliably in I don't think there prescription stockings area she has a history of an Anka-positive vasculitis and is on prednisone. Her past medical history is reviewed. She does not complain of anything that sounds like claudication still an active woman who lives in an independent living setting about a month ago she tells me she hit her right anterior leg on a box. This caused a open wound. She is been treating this with Neosporin without improvement. She is not a diabetic. Her arterial ABI in this clinic was noncompressible the patient is been seen in this clinic 2 times before. However I was not involved in her care nor was any of the current physician group in this clinic 02/06/16; good progress/Prisma. Has been for her arterial studies although we don't have those results 02/13/16: right lower leg wound has achieved complete epithelialization. Reviewed vascular report that noted [name], [name] ([id number]) nl toe pressures. 02/21/2016 -- patient was recently discharged from the wound center week ago and came in today saying that she has pain in her leg especially in the area where she has the wound. The wound has not opened and there is no evidence of any redness or swelling. Objective Constitutional Pulse regular. Respirations normal and unlabored. Afebrile. Vitals Time Taken: 10:50 AM, Height: 64 in, Weight: 128 lbs, BMI: 22, Temperature: 98.2 F, Pulse: 67 bpm, Respiratory Rate: 18 breaths/min, Blood Pressure: 162/75  mmHg. Eyes Nonicteric. Reactive to light. Ears, Nose, Mouth, and Throat Lips, teeth, and gums WNL.Marland Kitchen Moist mucosa without lesions. Neck supple and nontender. No palpable supraclavicular or cervical adenopathy. Normal sized without goiter. Respiratory WNL. No retractions.. Cardiovascular Pedal Pulses WNL. No clubbing, cyanosis or edema. Lymphatic No adneopathy. No adenopathy. No adenopathy. Musculoskeletal Adexa without tenderness or enlargement.. Digits and nails w/o clubbing, cyanosis, infection, petechiae, ischemia, or inflammatory conditions.Marland Kitchen Psychiatric Judgement and insight Intact.. No evidence of depression, anxiety, or agitation.. General Notes: there is +1 pitting edema of the right lower extremity but there is no evidence of maceration opening of the wound and there is no cellulitis. Peripheral pulses are palpable. Integumentary (Hair, Skin) [name], Suzanne W. ([id number])  No suspicious lesions. No crepitus or fluctuance. No peri-wound warmth or erythema. No masses.. Assessment Plan Edema Control: Patient to wear own compression stockings - Wear first thing in the morning and wear it all day. Only remove when you going to sleep. Elevate legs to the level of the heart and pump ankles as often as possible Discharge From [facility]: Discharge from [facility] patient was recently discharged from the wound center after a healed wound on the right lower extremity, now complains of localized pain in her skin over the wound but there is no evidence of cellulitis, or reopening of the ulcer. She has some edema and I have reiterated that she should be elevating her legs as much as possible and using her compression stockings and I discussed at length that she should be wearing it first thing in the morning all through the day until she goes to bed at night. We have gone over this several times during my conversation and she seems to understand the  treatment plan. Electronic Signature(s) Signed: 02/21/2016 11:12:21 AM By: Christin Fudge MD, FACS Entered By: Christin Fudge on 02/21/2016 11:12:21 [name], [name] ([id number]) -------------------------------------------------------------------------------- SuperBill Details Patient Name: Suzanne Manges Date of Service: 02/21/2016 Medical Record Patient Account Number: [id number] Number: Afful, RN, BSN, Treating RN: April 04, 1929 (82 y.o. 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 Facility Procedures CPT4 Code: [id number] Description: [id number] - WOUND CARE VISIT-LEV 1 EST PT Modifier: Quantity: 1 Physician Procedures CPT4: Description Modifier Quantity Code [id number] - WC PHYS LEVEL 3 - EST PT 1 ICD-10 Description Diagnosis L97.211 Non-pressure chronic ulcer of right calf limited to breakdown of skin I87.331 Chronic venous hypertension (idiopathic) with ulcer and  inflammation of right lower extremity 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

## 2016-02-22 NOTE — Progress Notes (Signed)
[name], [name] ([id number]) Visit Report for 02/21/2016 Arrival Information Details Patient Name: [name], [name] 02/21/2016 10:45 Date of Service: AM Medical Record [id number] Number: Patient Account Number: [id number] Jan 14, 1929 (82 y.o. Treating RN: Baruch Gouty, RN, BSN, Velva Harman Date of Birth/Sex: Female) Other Clinician: Primary Care Physician: [name] Treating Kelly, Suzanne Referring Physician: Caryl Kelly, Suzanne Physician/Extender: [name]: 3 Visit Information History Since Last Visit All ordered tests and consults were completed: No Patient Arrived: Ambulatory Added or deleted any medications: No Arrival Time: 10:49 Any new allergies or adverse reactions: No Accompanied By: self Had a fall or experienced change in No Transfer Assistance: None activities of daily living that may affect Patient Identification Verified: Yes risk of falls: Secondary Verification Process Yes Signs or symptoms of abuse/neglect since last No Completed: visito Patient Has Alerts: No Hospitalized since last visit: No Pain Present Now: Yes Electronic Signature(s) Signed: 02/21/2016 3:37:03 PM By: Regan Lemming BSN, RN Entered By: Regan Lemming on 02/21/2016 10:49:43 [name] ([id number]) -------------------------------------------------------------------------------- Clinic Level of Care Assessment Details Patient Name: [name]. 02/21/2016 10:45 Date of Service: AM Medical Record [id number] Number: Patient Account Number: [id number] 06/02/1929 (82 y.o. Treating RN: Afful, RN, BSN, Velva Harman Date of Birth/Sex: Female) Other Clinician: Primary Care Physician: [name] Treating Kelly, Suzanne Referring Physician: Caryl Kelly, Suzanne Physician/Extender: [name]: 3 Clinic Level of Care Assessment Items TOOL 4 Quantity Score []  - Use when only an EandM is performed on FOLLOW-UP visit 0 ASSESSMENTS - Nursing Assessment / Reassessment X - Reassessment of  Co-morbidities (includes updates in patient status) 1 10 X - Reassessment of Adherence to Kelly Plan 1 5 ASSESSMENTS - Wound and Skin Assessment / Reassessment []  - Simple Wound Assessment / Reassessment - one wound 0 []  - Complex Wound Assessment / Reassessment - multiple wounds 0 []  - Dermatologic / Skin Assessment (not related to wound area) 0 ASSESSMENTS - Focused Assessment []  - Circumferential Edema Measurements - multi extremities 0 []  - Nutritional Assessment / Counseling / Intervention 0 []  - Lower Extremity Assessment (monofilament, tuning fork, pulses) 0 []  - Peripheral Arterial Disease Assessment (using hand held doppler) 0 ASSESSMENTS - Ostomy and/or Continence Assessment and Care []  - Incontinence Assessment and Management 0 []  - Ostomy Care Assessment and Management (repouching, etc.) 0 PROCESS - Coordination of Care X - Simple Patient / Family Education for ongoing care 1 15 []  - Complex (extensive) Patient / Family Education for ongoing care 0 []  - Staff obtains Programmer, systems, Records, Test Results / Process Orders 0 []  - Staff telephones HHA, Nursing Homes / Clarify orders / etc 0 [name], [name] ([id number]) []  - Routine Transfer to another Facility (non-emergent condition) 0 []  - Routine Hospital Admission (non-emergent condition) 0 []  - New Admissions / Biomedical engineer / Ordering NPWT, Apligraf, etc. 0 []  - Emergency Hospital Admission (emergent condition) 0 []  - Simple Discharge Coordination 0 []  - Complex (extensive) Discharge Coordination 0 PROCESS - Special Needs []  - Pediatric / Minor Patient Management 0 []  - Isolation Patient Management 0 []  - Hearing / Language / Visual special needs 0 []  - Assessment of Community assistance (transportation, D/C planning, etc.) 0 []  - Additional assistance / Altered mentation 0 []  - Support Surface(s) Assessment (bed, cushion, seat, etc.) 0 INTERVENTIONS - Wound Cleansing / Measurement []  - Simple Wound  Cleansing - one wound 0 []  - Complex Wound Cleansing - multiple wounds 0 []  - Wound Imaging (photographs - any number of wounds) 0 []  - Wound Tracing (instead  of photographs) 0 []  - Simple Wound Measurement - one wound 0 []  - Complex Wound Measurement - multiple wounds 0 INTERVENTIONS - Wound Dressings []  - Small Wound Dressing one or multiple wounds 0 []  - Medium Wound Dressing one or multiple wounds 0 []  - Large Wound Dressing one or multiple wounds 0 []  - Application of Medications - topical 0 []  - Application of Medications - injection 0 [name], [name] ([id number]) INTERVENTIONS - Miscellaneous []  - External ear exam 0 []  - Specimen Collection (cultures, biopsies, blood, body fluids, etc.) 0 []  - Specimen(s) / Culture(s) sent or taken to Lab for analysis 0 []  - Patient Transfer (multiple staff / Harrel Lemon Lift / Similar devices) 0 []  - Simple Staple / Suture removal (25 or less) 0 []  - Complex Staple / Suture removal (26 or more) 0 []  - Hypo / Hyperglycemic Management (close monitor of Blood Glucose) 0 []  - Ankle / Brachial Index (ABI) - do not check if billed separately 0 X - Vital Signs 1 5 Has the patient been seen at the hospital within the last three years: Yes Total Score: 35 Level Of Care: New/Established - Level 1 Electronic Signature(s) Signed: 02/21/2016 3:37:03 PM By: Regan Lemming BSN, RN Entered By: Regan Lemming on 02/21/2016 11:06:54 [name] ([id number]) -------------------------------------------------------------------------------- Encounter Discharge Information Details Patient Name: [name]. 02/21/2016 10:45 Date of Service: AM Medical Record [id number] Number: Patient Account Number: [id number] 1929-03-24 (82 y.o. Treating RN: Baruch Gouty, RN, BSN, Velva Harman Date of Birth/Sex: Female) Other Clinician: Primary Care Physician: [name] Treating Kelly, Suzanne Referring Physician: Caryl Kelly, Suzanne Physician/Extender: [name]:  3 Encounter Discharge Information Items Discharge Pain Level: 0 Discharge Condition: Stable Ambulatory Status: Ambulatory Discharge Destination: Home Transportation: Private Auto Accompanied By: self Schedule Follow-up Appointment: No Medication Reconciliation completed and provided to Patient/Care No Jannet Calip: Provided on Clinical Summary of Care: 02/21/2016 Form Type Recipient Paper Patient NB Electronic Signature(s) Signed: 02/21/2016 11:12:47 AM By: Ruthine Dose Entered By: Ruthine Dose on 02/21/2016 11:12:47 Najjar, [name] ([id number]) -------------------------------------------------------------------------------- General Visit Notes Details Patient Name: [name]. 02/21/2016 10:45 Date of Service: AM Medical Record [id number] Number: Patient Account Number: [id number] 11-04-1928 (82 y.o. Treating RN: Afful, RN, BSN, Velva Harman Date of Birth/Sex: Female) Other Clinician: Primary Care Physician: [name] Treating Kelly, Suzanne Referring Physician: Caryl Kelly, Suzanne Physician/Extender: Weeks in Kelly: 3 Notes Patient came to clinic for follow up due to increased pain to right lower leg. No wound present. Electronic Signature(s) Signed: 02/21/2016 3:37:03 PM By: Regan Lemming BSN, RN Entered By: Regan Lemming on 02/21/2016 11:04:39 Godar, [name] ([id number]) -------------------------------------------------------------------------------- Lower Extremity Assessment Details Patient Name: BRONNIE, FANTA 02/21/2016 10:45 Date of Service: AM Medical Record [id number] Number: Patient Account Number: [id number] 26-Jun-1929 (82 y.o. Treating RN: Baruch Gouty, RN, BSN, Velva Harman Date of Birth/Sex: Female) Other Clinician: Primary Care Physician: [name] Treating Kelly, Suzanne Referring Physician: Caryl Kelly, Suzanne Physician/Extender: Weeks in Kelly: 3 Vascular Assessment Pulses: Posterior Tibial Dorsalis Pedis Palpable: [Right:Yes] Extremity colors,  hair growth, and conditions: Extremity Color: [Right:Mottled] Hair Growth on Extremity: [Right:No] Temperature of Extremity: [Right:Warm] Capillary Refill: [Right:< 3 seconds] Electronic Signature(s) Signed: 02/21/2016 3:37:03 PM By: Regan Lemming BSN, RN Entered By: Regan Lemming on 02/21/2016 10:51:02 Sternberg, [name] ([id number]) -------------------------------------------------------------------------------- Pain Assessment Details Patient Name: [name]. 02/21/2016 10:45 Date of Service: AM Medical Record [id number] Number: Patient Account Number: [id number] 1929-04-01 (82 y.o. Treating RN: Baruch Gouty, RN, BSN, Velva Harman Date of Birth/Sex: Female) Other Clinician: Primary Care Physician: [name] Treating  Christin Fudge Referring Physician: Caryl Kelly, Suzanne Physician/Extender: [name]: 3 Active Problems Location of Pain Severity and Description of Pain Patient Has Paino Yes Site Locations Pain Location: Pain in Ulcers Pain Management and Medication Current Pain Management: How does your pain impact your activities of daily livingo Sleep: Yes Bathing: Yes Appetite: Yes Relationship With Others: Yes Bladder Continence: Yes Emotions: Yes Bowel Continence: Yes Work: Yes Toileting: Yes Drive: Yes Dressing: Yes Hobbies: Yes Electronic Signature(s) Signed: 02/21/2016 3:37:03 PM By: Regan Lemming BSN, RN Entered By: Regan Lemming on 02/21/2016 10:49:53 Carollo, [name] ([id number]) -------------------------------------------------------------------------------- Patient/Caregiver Education Details Patient Name: [name]. 02/21/2016 10:45 Date of Service: AM Medical Record [id number] Number: Patient Account Number: [id number] 19-Jul-1929 (82 y.o. Treating RN: Baruch Gouty, RN, BSN, Velva Harman Date of Birth/Gender: Female) Other Clinician: Primary Care Physician: [name] Treating Kelly, Suzanne Referring Physician: Caryl Kelly,  Suzanne Physician/Extender: [name]: 3 Education Assessment Education Provided To: Patient Education Topics Provided Basic Hygiene: Methods: Explain/Verbal Responses: State content correctly Electronic Signature(s) Signed: 02/21/2016 3:37:03 PM By: Regan Lemming BSN, RN Entered By: Regan Lemming on 02/21/2016 11:10:35 Hecht, [name] ([id number]) -------------------------------------------------------------------------------- Vitals Details Patient Name: [name]. 02/21/2016 10:45 Date of Service: AM Medical Record [id number] Number: Patient Account Number: [id number] [birth year] (82 y.o. Treating RN: Afful, RN, BSN, Velva Harman Date of Birth/Sex: Female) Other Clinician: Primary Care Physician: [name] Treating Kelly, Suzanne Referring Physician: Caryl Kelly, Suzanne Physician/Extender: Weeks in Kelly: 3 Vital Signs Time Taken: 10:50 Temperature (F): 98.2 Height (in): 64 Pulse (bpm): 67 Weight (lbs): 128 Respiratory Rate (breaths/min): 18 Body Mass Index (BMI): 22 Blood Pressure (mmHg): 162/75 Reference Range: 80 - 120 mg / dl Electronic Signature(s) Signed: 02/21/2016 3:37:03 PM By: Regan Lemming BSN, RN Entered By: Regan Lemming on 02/21/2016 10:50:44

## 2016-02-25 ENCOUNTER — Telehealth: Payer: Self-pay | Admitting: *Deleted

## 2016-02-25 NOTE — Telephone Encounter (Signed)
Please advise a scheduled time and date for pt to see Dr. Caryl Bis for a wound check on her right leg. She would like an appt. Asap. Her leg has pain, and the wound center can no longer help her issue.  Pt contact [phone]

## 2016-02-25 NOTE — Telephone Encounter (Signed)
Where would you like [name] to put her on your schedule? There is a 415 today? Is that ok?

## 2016-02-25 NOTE — Telephone Encounter (Signed)
Spoke with the patient, she was able to get in to see the wound center tomorrow at 830, so she is going to go there and then call us if she needs follow up. thanks

## 2016-02-25 NOTE — Telephone Encounter (Signed)
She can be placed on the schedule tomorrow at 8 am if possible. Has she seen the wound center? I don't see any notes from them in her chart.

## 2016-02-25 NOTE — Telephone Encounter (Signed)
Noted  

## 2016-02-26 ENCOUNTER — Encounter: Payer: Medicare Other | Admitting: Internal Medicine

## 2016-02-26 ENCOUNTER — Other Ambulatory Visit: Payer: Self-pay | Admitting: Internal Medicine

## 2016-02-26 ENCOUNTER — Ambulatory Visit
Admission: RE | Admit: 2016-02-26 | Discharge: 2016-02-26 | Disposition: A | Discharge status: Home / Self Care | Payer: Medicare Other | Source: Ambulatory Visit | Attending: Internal Medicine | Admitting: Internal Medicine

## 2016-02-26 DIAGNOSIS — I82501 Chronic embolism and thrombosis of unspecified deep veins of right lower extremity: Secondary | ICD-10-CM

## 2016-02-26 DIAGNOSIS — M79661 Pain in right lower leg: Secondary | ICD-10-CM | POA: Insufficient documentation

## 2016-02-26 DIAGNOSIS — L97211 Non-pressure chronic ulcer of right calf limited to breakdown of skin: Secondary | ICD-10-CM | POA: Diagnosis not present

## 2016-02-27 ENCOUNTER — Ambulatory Visit: Payer: Medicare Other

## 2016-02-27 ENCOUNTER — Encounter: Payer: Medicare Other | Admitting: Internal Medicine

## 2016-02-27 DIAGNOSIS — L97211 Non-pressure chronic ulcer of right calf limited to breakdown of skin: Secondary | ICD-10-CM | POA: Diagnosis not present

## 2016-02-27 NOTE — Progress Notes (Signed)
TOMARA, LASH ([id number]) Visit Report for 02/26/2016 Arrival Information Details Patient Name: [name], [name] Date of Service: 02/26/2016 8:45 AM Medical Record Patient Account Number: [id number] Number: Treating RN: Ahmed Prima 08/24/1928 (82 y.o. Other Clinician: Date of Birth/Sex: Female) Treating ROBSON, MICHAEL Primary Care Physician/Extender: Carolan Shiver, ERIC Physician: Referring Physician: Caryl Bis, ERIC Weeks in Treatment: 3 Visit Information History Since Last Visit All ordered tests and consults were completed: No Patient Arrived: Ambulatory Added or deleted any medications: No Arrival Time: 08:42 Any new allergies or adverse reactions: No Accompanied By: self Had a fall or experienced change in No Transfer Assistance: None activities of daily living that may affect Patient Identification Verified: Yes risk of falls: Secondary Verification Process Yes Signs or symptoms of abuse/neglect since last No Completed: visito Patient Has Alerts: No Hospitalized since last visit: No Pain Present Now: Yes Electronic Signature(s) Signed: 02/26/2016 3:59:42 PM By: Alric Quan Entered By: Alric Quan on 02/26/2016 08:42:29 Kerschner, [name] ([id number]) -------------------------------------------------------------------------------- Clinic Level of Care Assessment Details Patient Name: [name] Date of Service: 02/26/2016 8:45 AM Medical Record Patient Account Number: [id number] Number: Treating RN: Ahmed Prima 06-15-1929 (82 y.o. Other Clinician: Date of Birth/Sex: Female) Treating ROBSON, MICHAEL Primary Care Physician/Extender: Carolan Shiver, ERIC Physician: Referring Physician: Caryl Bis, ERIC Weeks in Treatment: 3 Clinic Level of Care Assessment Items TOOL 4 Quantity Score X - Use when only an EandM is performed on FOLLOW-UP visit 1 0 ASSESSMENTS - Nursing Assessment / Reassessment X - Reassessment of  Co-morbidities (includes updates in patient status) 1 10 X - Reassessment of Adherence to Treatment Plan 1 5 ASSESSMENTS - Wound and Skin Assessment / Reassessment []  - Simple Wound Assessment / Reassessment - one wound 0 []  - Complex Wound Assessment / Reassessment - multiple wounds 0 X - Dermatologic / Skin Assessment (not related to wound area) 1 10 ASSESSMENTS - Focused Assessment X - Circumferential Edema Measurements - multi extremities 1 5 []  - Nutritional Assessment / Counseling / Intervention 0 []  - Lower Extremity Assessment (monofilament, tuning fork, pulses) 0 []  - Peripheral Arterial Disease Assessment (using hand held doppler) 0 ASSESSMENTS - Ostomy and/or Continence Assessment and Care []  - Incontinence Assessment and Management 0 []  - Ostomy Care Assessment and Management (repouching, etc.) 0 PROCESS - Coordination of Care []  - Simple Patient / Family Education for ongoing care 0 X - Complex (extensive) Patient / Family Education for ongoing care 1 20 X - Staff obtains Consents, Records, Test Results / Process Orders 1 10 Santillano, Tristian W. ([id number]) []  - Staff telephones HHA, Nursing Homes / Clarify orders / etc 0 []  - Routine Transfer to another Facility (non-emergent condition) 0 []  - Routine Hospital Admission (non-emergent condition) 0 []  - New Admissions / Biomedical engineer / Ordering NPWT, Apligraf, etc. 0 []  - Emergency Hospital Admission (emergent condition) 0 X - Simple Discharge Coordination 1 10 []  - Complex (extensive) Discharge Coordination 0 PROCESS - Special Needs []  - Pediatric / Minor Patient Management 0 []  - Isolation Patient Management 0 []  - Hearing / Language / Visual special needs 0 []  - Assessment of Community assistance (transportation, D/C planning, etc.) 0 []  - Additional assistance / Altered mentation 0 []  - Support Surface(s) Assessment (bed, cushion, seat, etc.) 0 INTERVENTIONS - Wound Cleansing / Measurement []  - Simple Wound  Cleansing - one wound 0 []  - Complex Wound Cleansing - multiple wounds 0 []  - Wound Imaging (photographs - any number of wounds) 0 []  - Wound Tracing (  instead of photographs) 0 []  - Simple Wound Measurement - one wound 0 []  - Complex Wound Measurement - multiple wounds 0 INTERVENTIONS - Wound Dressings []  - Small Wound Dressing one or multiple wounds 0 []  - Medium Wound Dressing one or multiple wounds 0 []  - Large Wound Dressing one or multiple wounds 0 []  - Application of Medications - topical 0 []  - Application of Medications - injection 0 Yonker, Aniza W. ([id number]) INTERVENTIONS - Miscellaneous []  - External ear exam 0 []  - Specimen Collection (cultures, biopsies, blood, body fluids, etc.) 0 []  - Specimen(s) / Culture(s) sent or taken to Lab for analysis 0 []  - Patient Transfer (multiple staff / Harrel Lemon Lift / Similar devices) 0 []  - Simple Staple / Suture removal (25 or less) 0 []  - Complex Staple / Suture removal (26 or more) 0 []  - Hypo / Hyperglycemic Management (close monitor of Blood Glucose) 0 []  - Ankle / Brachial Index (ABI) - do not check if billed separately 0 X - Vital Signs 1 5 Has the patient been seen at the hospital within the last three years: Yes Total Score: 75 Level Of Care: New/Established - Level 2 Electronic Signature(s) Signed: 02/26/2016 3:59:42 PM By: Alric Quan Entered By: Alric Quan on 02/26/2016 11:35:15 Delatorre, [name] ([id number]) -------------------------------------------------------------------------------- Encounter Discharge Information Details Patient Name: [name] Date of Service: 02/26/2016 8:45 AM Medical Record Patient Account Number: [id number] Number: Treating RN: Ahmed Prima [birth year] (82 y.o. Other Clinician: Date of Birth/Sex: Female) Treating ROBSON, MICHAEL Primary Care Physician/Extender: Carolan Shiver, ERIC Physician: Referring Physician: Caryl Bis ERIC Weeks in Treatment:  3 Encounter Discharge Information Items Discharge Pain Level: 0 Discharge Condition: Stable Ambulatory Status: Ambulatory Discharge Destination: Home Transportation: Private Auto Accompanied By: self Schedule Follow-up Appointment: No Medication Reconciliation completed and provided to Patient/Care No Minervia Osso: Provided on Clinical Summary of Care: 02/26/2016 Form Type Recipient Paper Patient NB Electronic Signature(s) Signed: 02/26/2016 9:37:45 AM By: Ruthine Dose Entered By: Ruthine Dose on 02/26/2016 09:37:45 Rackley, [name] ([id number]) -------------------------------------------------------------------------------- Lower Extremity Assessment Details Patient Name: [name] Date of Service: 02/26/2016 8:45 AM Medical Record Patient Account Number: [id number] Number: Treating RN: Ahmed Prima 06/21/1929 (82 y.o. Other Clinician: Date of Birth/Sex: Female) Treating ROBSON, MICHAEL Primary Care Physician/Extender: Carolan Shiver, ERIC Physician: Referring Physician: Caryl Bis, ERIC Weeks in Treatment: 3 Vascular Assessment Pulses: Posterior Tibial Dorsalis Pedis Palpable: [Right:Yes] Extremity colors, hair growth, and conditions: Extremity Color: [Right:Mottled] Temperature of Extremity: [Right:Warm] Capillary Refill: [Right:< 3 seconds] Electronic Signature(s) Signed: 02/26/2016 3:59:42 PM By: Alric Quan Entered By: Alric Quan on 02/26/2016 08:46:53 Oates, [name] ([id number]) -------------------------------------------------------------------------------- Multi Wound Chart Details Patient Name: [name] Date of Service: 02/26/2016 8:45 AM Medical Record Patient Account Number: [id number] Number: Treating RN: Ahmed Prima 07-17-1929 (82 y.o. Other Clinician: Date of Birth/Sex: Female) Treating ROBSON, MICHAEL Primary Care Physician/Extender: Carolan Shiver, ERIC Physician: Referring Physician:  Caryl Bis, ERIC Weeks in Treatment: 3 Vital Signs Height(in): 64 Pulse(bpm): 69 Weight(lbs): 128 Blood Pressure 111/69 (mmHg): Body Mass Index(BMI): 22 Temperature(F): 98.0 Respiratory Rate 20 (breaths/min): Wound Assessments Treatment Notes Electronic Signature(s) Signed: 02/26/2016 3:59:42 PM By: Alric Quan Entered By: Alric Quan on 02/26/2016 08:58:20 Jeanty, [name] ([id number]) -------------------------------------------------------------------------------- [place] Details Patient Name: [name] Date of Service: 02/26/2016 8:45 AM Medical Record Patient Account Number: [id number] Number: Treating RN: Ahmed Prima [birth year] (82 y.o. Other Clinician: Date of Birth/Sex: Female) Treating ROBSON, MICHAEL Primary Care Physician/Extender: Carolan Shiver, ERIC Physician: Referring Physician: Caryl Bis  ERIC Weeks in Treatment: 3 Active Inactive Pain, Acute or Chronic Nursing Diagnoses: Pain, acute or chronic: actual or potential Potential alteration in comfort, pain Goals: Patient will verbalize adequate pain control and receive pain control interventions during procedures as needed Date Initiated: 02/26/2016 Goal Status: Active Interventions: Assess comfort goal upon admission Complete pain assessment as per visit requirements Notes: Electronic Signature(s) Signed: 02/26/2016 3:59:42 PM By: Alric Quan Entered By: Alric Quan on 02/26/2016 09:17:10 Luton, [name] ([id number]) -------------------------------------------------------------------------------- Non-Wound Condition Assessment Details Patient Name: [name] Date of Service: 02/26/2016 8:45 AM Medical Record Patient Account Number: [id number] Number: Treating RN: Ahmed Prima Feb 16, 1929 (82 y.o. Other Clinician: Date of Birth/Gender: Female) Treating ROBSON, [place] Primary Care Physician: Caryl Bis,  ERIC Physician/Extender: G Referring Physician: Caryl Bis, ERIC Weeks in Treatment: 3 Non-Wound Condition: Condition: Vasculitis Location: Leg Side: Periwound Skin Texture Texture Color No Abnormalities Noted: No No Abnormalities Noted: No Moisture No Abnormalities Noted: No Electronic Signature(s) Signed: 02/26/2016 3:59:42 PM By: Alric Quan Entered By: Alric Quan on 02/26/2016 09:25:22 Bisson, [name] ([id number]) -------------------------------------------------------------------------------- Pain Assessment Details Patient Name: [name] Date of Service: 02/26/2016 8:45 AM Medical Record Patient Account Number: [id number] Number: Treating RN: Ahmed Prima 1928-12-31 (82 y.o. Other Clinician: Date of Birth/Sex: Female) Treating ROBSON, MICHAEL Primary Care Physician/Extender: Carolan Shiver, ERIC Physician: Referring Physician: Caryl Bis, ERIC Weeks in Treatment: 3 Active Problems Location of Pain Severity and Description of Pain Patient Has Paino Yes Site Locations Pain Location: Generalized Pain Duration of the Pain. Constant / Intermittento Constant Rate the pain. Current Pain Level: 10 Worst Pain Level: 10 Least Pain Level: 4 Character of Pain Describe the Pain: Aching Pain Management and Medication Current Pain Management: Notes right leg Electronic Signature(s) Signed: 02/26/2016 3:59:42 PM By: Alric Quan Entered By: Alric Quan on 02/26/2016 08:46:13 Fakhouri, [name] ([id number]) -------------------------------------------------------------------------------- Patient/Caregiver Education Details Patient Name: [name] Date of Service: 02/26/2016 8:45 AM Medical Record Patient Account Number: [id number] Number: Treating RN: Ahmed Prima Aug 27, 1928 (82 y.o. Other Clinician: Date of Birth/Gender: Female) Treating ROBSON, MICHAEL Primary Care Physician: Caryl Bis,  ERIC Physician/Extender: G Referring Physician: Caryl Bis ERIC Weeks in Treatment: 3 Education Assessment Education Provided To: Patient Education Topics Provided Pain: Handouts: Other: keep leg elevated Methods: Explain/Verbal Responses: State content correctly Electronic Signature(s) Signed: 02/26/2016 3:59:42 PM By: Alric Quan Entered By: Alric Quan on 02/26/2016 09:26:03 Williamson, [name] ([id number]) -------------------------------------------------------------------------------- [place] Details Patient Name: [name] Date of Service: 02/26/2016 8:45 AM Medical Record Patient Account Number: [id number] Number: Treating RN: Ahmed Prima February 22, 1929 (82 y.o. Other Clinician: Date of Birth/Sex: Female) Treating ROBSON, MICHAEL Primary Care Physician/Extender: Carolan Shiver, ERIC Physician: Referring Physician: Caryl Bis, ERIC Weeks in Treatment: 3 Vital Signs Time Taken: 08:42 Temperature (F): 98.0 Height (in): 64 Pulse (bpm): 69 Weight (lbs): 128 Respiratory Rate (breaths/min): 20 Body Mass Index (BMI): 22 Blood Pressure (mmHg): 111/69 Reference Range: 80 - 120 mg / dl Electronic Signature(s) Signed: 02/26/2016 3:59:42 PM By: Alric Quan Entered By: Alric Quan on 02/26/2016 08:46:25

## 2016-02-27 NOTE — Progress Notes (Signed)
ZAELYN, GOLDSBY ([id number]) Visit Report for 02/26/2016 Chief Complaint Document Details Patient Name: [name], [name] Date of Service: 02/26/2016 8:45 AM Medical Record Patient Account Number: [id number] Number: Treating RN: Ahmed Prima [birth year] (82 y.o. Other Clinician: Date of Birth/Sex: Female) Treating ROBSON, MICHAEL Primary Care Physician/Extender: Carolan Shiver, ERIC Physician: Referring Physician: Caryl Bis, ERIC Weeks in Treatment: 3 Information Obtained from: Patient Chief Complaint Right calf traumatic ulceration. Right hand skin tear (healed). 01/30/16; patient returns to clinic with a wound on her right anterior leg that is been present for a month. Electronic Signature(s) Signed: 02/27/2016 7:54:01 AM By: Linton Ham MD Entered By: Linton Ham on 02/26/2016 09:49:44 Mom, [name] ([id number]) -------------------------------------------------------------------------------- HPI Details Patient Name: [name] Date of Service: 02/26/2016 8:45 AM Medical Record Patient Account Number: [id number] Number: Treating RN: Ahmed Prima 11-28-1928 (82 y.o. Other Clinician: Date of Birth/Sex: Female) Treating ROBSON, MICHAEL Primary Care Physician/Extender: Carolan Shiver, ERIC Physician: Referring Physician: Caryl Bis, ERIC Weeks in Treatment: 3 History of Present Illness HPI Description: Pleasant 82 year old with history of Arthritis;GERD (gastroesophageal reflux disease);Hypertension;Chronic kidney diseaseo;Vasculitis, ANCA positive, pulmonary hemorrhage;Thyroid disease;hyperthyroid, had radioactiveiodine treatment;Renal cell carcinoma 2003 s/p sgy for removal right kidney;Hemorrhoids.o She has also had several surgeries including abdominal hysterectomy with bilateral salpingo-oophorectomy, hernia repair, fracture of her femur on the right side, cholecystectomy, cardiac catheterization. She traumatized her right calf at 2  locations in November 2016. More recently fell and suffered a skin tear to her right hand (healed). Ambulating per her baseline. No significant pain. Right ABI 0.75. Performing dressing changes with silver alginate and using a Tubigrip for edema control. She returns to clinic for follow-up and is without complaints other than the silver alginate sticking to her wounds. No significant pain. No fever or chills. Minimal drainage. 01/30/16; this is an 82 year old woman who was previously been in this clinic on 2 occasions. In June 2016 she had a left leg wound and a right forearm wound. The lower extremity wound I think was caused by hitting her leg on the dishwasher door and the arm on the car door. She healed in 2-3 weeks with standard therapy. She was here in December 2016 with her traumatic wound to the right calf again this healed fairly quickly. She has an ABI on the right leg "it is 0.75. Today she is noncompressible. She does have some form of stocking but I don't think wears them reliably in I don't think there prescription stockings area she has a history of an ANCA-positive vasculitis and is on prednisone. Her past medical history is reviewed. She does not complain of anything that sounds like claudication still an active woman who lives in an independent living setting about a month ago she tells me she hit her right anterior leg on a box. This caused a open wound. She is been treating this with Neosporin without improvement. She is not a diabetic. Her arterial ABI in this clinic was noncompressible the patient is been seen in this clinic 2 times before. However I was not involved in her care nor was any of the current physician group in this clinic 02/06/16; good progress/Prisma. Has been for her arterial studies although we don't have those results 02/13/16: right lower leg wound has achieved complete epithelialization. Reviewed vascular report that noted nl toe pressures. 02/21/2016 --  patient was recently discharged from the wound center week ago and came in today saying that she has pain in her leg especially in the area where she has the  wound. The wound has not opened and there is no evidence of any redness or swelling. SHRITA, BROSSEAU ([id number]) 02/26/16; this is a patient I saw about a month ago with a wound on her right anterior leg. She has a history of chronic venous insufficiency chronic prednisone use for ANCA positive vasculitis. Her wound healed with standard treatment. She was discharged from the clinic. She returned last week to see Dr. Con Memos complaining of pain on her anterior right leg. I don't think this was felt to be anything related to the wound on the right leg that we treated her for. She keeps her legs on pillows at night but finds the elevation makes the pain worse. She is unable to tell me anything else that makes it better. She has been taking Tylenol but then Redick causes liver problems so she hasn't been taking that. I sent her to see Dr. Fletcher Anon has of the asymmetry in her peripheral pulses. I reviewed his note, he did not feel she had significant arterial insufficiency. Although her ABIs were noncompressible in our clinic her toe brachial indexes were normal bilaterally although I don't see these results Electronic Signature(s) Signed: 02/27/2016 7:54:01 AM By: Linton Ham MD Entered By: Linton Ham on 02/26/2016 09:56:15 Reinitz, [name] ([id number]) -------------------------------------------------------------------------------- Physical Exam Details Patient Name: [name] Date of Service: 02/26/2016 8:45 AM Medical Record Patient Account Number: [id number] Number: Treating RN: Ahmed Prima [birth year] (82 y.o. Other Clinician: Date of Birth/Sex: Female) Treating ROBSON, MICHAEL Primary Care Physician/Extender: Carolan Shiver, ERIC Physician: Referring Physician: Caryl Bis, ERIC Weeks in Treatment:  3 Constitutional Sitting or standing Blood Pressure is within target range for patient.. Pulse regular and within target range for patient.Marland Kitchen Respirations regular, non-labored and within target range.. Temperature is normal and within the target range for the patient.. Patient's appearance is neat and clean. Appears in no acute distress. Well nourished and well developed.. Eyes Conjunctivae clear. No discharge.Marland Kitchen Respiratory Respiratory effort is easy and symmetric bilaterally. Rate is normal at rest and on room air.. Bilateral breath sounds are clear and equal in all lobes with no wheezes, rales or rhonchi.. Cardiovascular Heart rhythm and rate regular, without murmur or gallop.. Old femoral and popliteal pulses are strong. The patient has once again strong pulses in her left foot palpable but weak her and her right at the dorsalis pedis and posterior tibial.. Edema present in both extremities which is mild. She has extensive bilateral hemosiderin deposition. Lymphatic None palpable in the popliteal or inguinal area. Integumentary (Hair, Skin) She has hemosiderin deposition bilaterally in her anterior legs but no other suspicious rashes. Neurological Deep tendon reflexes are normal in bilateral lower extremities. Toes are downgoing. Sensation normal to touch, pin, and vibration. Sensation intact to 10 gram monofilament in extremities.Marland Kitchen Psychiatric No evidence of depression, anxiety, or agitation. Calm, cooperative, and communicative. Appropriate interactions and affect.. Notes Wound exam; there is no open wound. Peripheral pulses are intact. She has tenderness along the medial aspect greater than lateral aspect of the right tibia. Electronic Signature(s) Signed: 02/27/2016 7:54:01 AM By: Linton Ham MD Entered By: Linton Ham on 02/26/2016 09:54:20 Sherwood, [name] ([id number]) Ziff, [name]  ([id number]) -------------------------------------------------------------------------------- Physician Orders Details Patient Name: [name] Date of Service: 02/26/2016 8:45 AM Medical Record Patient Account Number: [id number] Number: Treating RN: Ahmed Prima Jun 03, 1929 (82 y.o. Other Clinician: Date of Birth/Sex: Female) Treating ROBSON, MICHAEL Primary Care Physician/Extender: Carolan Shiver, ERIC Physician: Referring Physician: Caryl Bis, ERIC Weeks in Treatment: 3 Verbal /  Phone Orders: No Diagnosis Coding Dressing Change Frequency o Change dressing every week Follow-up Appointments o Return Appointment in 1 week. Edema Control o Elevate legs to the level of the heart and pump ankles as often as possible Services and Therapies o Venous Duplex Doppler - Right leg Custom Services o Ultrasound - DVT study/Superficial Thrombosiso------Right Leg Electronic Signature(s) Signed: 02/26/2016 3:59:42 PM By: Alric Quan Signed: 02/27/2016 7:54:01 AM By: Linton Ham MD Entered By: Alric Quan on 02/26/2016 11:30:25 Mapes, [name] ([id number]) -------------------------------------------------------------------------------- Problem List Details Patient Name: [name] Date of Service: 02/26/2016 8:45 AM Medical Record Number: [id number] Patient Account Number: [id number] Date of Birth/Sex: May 04, 1929 (82 y.o. Female) Treating RN: Primary Care Physician: Caryl Bis, ERIC Other Clinician: Referring Physician: Caryl Bis, ERIC Treating Physician/Extender: Suella Grove in Treatment: 3 Active Problems Inactive Problems Resolved Problems Electronic Signature(s) Signed: 02/27/2016 7:54:01 AM By: Linton Ham MD Entered By: Linton Ham on 02/26/2016 09:49:24 Fluharty, [name] ([id number]) -------------------------------------------------------------------------------- Progress Note Details Patient Name: [name] Date of  Service: 02/26/2016 8:45 AM Medical Record Patient Account Number: [id number] Number: Treating RN: Ahmed Prima 1928/12/21 (82 y.o. Other Clinician: Date of Birth/Sex: Female) Treating ROBSON, MICHAEL Primary Care Physician/Extender: Carolan Shiver, ERIC Physician: Referring Physician: Caryl Bis, ERIC Weeks in Treatment: 3 Subjective Chief Complaint Information obtained from Patient Right calf traumatic ulceration. Right hand skin tear (healed). 01/30/16; patient returns to clinic with a wound on her right anterior leg that is been present for a month. History of Present Illness (HPI) Pleasant 82 year old with history of Arthritis;GERD (gastroesophageal reflux disease);Hypertension;Chronic kidney disease ;Vasculitis, ANCA positive, pulmonary hemorrhage;Thyroid disease;hyperthyroid, had radioactiveiodine treatment;Renal cell carcinoma 2003 s/p sgy for removal right kidney;Hemorrhoids. She has also had several surgeries including abdominal hysterectomy with bilateral salpingo-oophorectomy, hernia repair, fracture of her femur on the right side, cholecystectomy, cardiac catheterization. She traumatized her right calf at 2 locations in November 2016. More recently fell and suffered a skin tear to her right hand (healed). Ambulating per her baseline. No significant pain. Right ABI 0.75. Performing dressing changes with silver alginate and using a Tubigrip for edema control. She returns to clinic for follow-up and is without complaints other than the silver alginate sticking to her wounds. No significant pain. No fever or chills. Minimal drainage. 01/30/16; this is an 82 year old woman who was previously been in this clinic on 2 occasions. In June 2016 she had a left leg wound and a right forearm wound. The lower extremity wound I think was caused by hitting her leg on the dishwasher door and the arm on the car door. She healed in 2-3 weeks with standard therapy. She was here in  December 2016 with her traumatic wound to the right calf again this healed fairly quickly. She has an ABI on the right leg "it is 0.75. Today she is noncompressible. She does have some form of stocking but I don't think wears them reliably in I don't think there prescription stockings area she has a history of an ANCA-positive vasculitis and is on prednisone. Her past medical history is reviewed. She does not complain of anything that sounds like claudication still an active woman who lives in an independent living setting about a month ago she tells me she hit her right anterior leg on a box. This caused a open wound. She is been treating this with Neosporin without improvement. She is not a diabetic. Her arterial ABI in this clinic was noncompressible the patient is been seen in this clinic 2 times before. However I  was not involved in her care nor was any of the current physician group in this clinic 02/06/16; good progress/Prisma. Has been for her arterial studies although we don't have those results PALLIE, BANEZ. ([id number]) 02/13/16: right lower leg wound has achieved complete epithelialization. Reviewed vascular report that noted nl toe pressures. 02/21/2016 -- patient was recently discharged from the wound center week ago and came in today saying that she has pain in her leg especially in the area where she has the wound. The wound has not opened and there is no evidence of any redness or swelling. 02/26/16; this is a patient I saw about a month ago with a wound on her right anterior leg. She has a history of chronic venous insufficiency chronic prednisone use for ANCA positive vasculitis. Her wound healed with standard treatment. She was discharged from the clinic. She returned last week to see Dr. Con Memos complaining of pain on her anterior right leg. I don't think this was felt to be anything related to the wound on the right leg that we treated her for. She keeps her legs on pillows  at night but finds the elevation makes the pain worse. She is unable to tell me anything else that makes it better. She has been taking Tylenol but then Redick causes liver problems so she hasn't been taking that. I sent her to see Dr. Fletcher Anon has of the asymmetry in her peripheral pulses. I reviewed his note, he did not feel she had significant arterial insufficiency. Although her ABIs were noncompressible in our clinic her toe brachial indexes were normal bilaterally although I don't see these results Objective Constitutional Sitting or standing Blood Pressure is within target range for patient.. Pulse regular and within target range for patient.Marland Kitchen Respirations regular, non-labored and within target range.. Temperature is normal and within the target range for the patient.. Patient's appearance is neat and clean. Appears in no acute distress. Well nourished and well developed.. Vitals Time Taken: 8:42 AM, Height: 64 in, Weight: 128 lbs, BMI: 22, Temperature: 98.0 F, Pulse: 69 bpm, Respiratory Rate: 20 breaths/min, Blood Pressure: 111/69 mmHg. Eyes Conjunctivae clear. No discharge.Marland Kitchen Respiratory Respiratory effort is easy and symmetric bilaterally. Rate is normal at rest and on room air.. Bilateral breath sounds are clear and equal in all lobes with no wheezes, rales or rhonchi.. Cardiovascular Heart rhythm and rate regular, without murmur or gallop.. Old femoral and popliteal pulses are strong. The patient has once again strong pulses in her left foot palpable but weak her and her right at the dorsalis pedis and posterior tibial.. Edema present in both extremities which is mild. She has extensive bilateral hemosiderin deposition. Lymphatic None palpable in the popliteal or inguinal area. Allport, Daesha W. ([id number]) Neurological Deep tendon reflexes are normal in bilateral lower extremities. Toes are downgoing. Sensation normal to touch, pin, and vibration. Sensation intact to 10 gram  monofilament in extremities.Marland Kitchen Psychiatric No evidence of depression, anxiety, or agitation. Calm, cooperative, and communicative. Appropriate interactions and affect.. General Notes: Wound exam; there is no open wound. Peripheral pulses are intact. She has tenderness along the medial aspect greater than lateral aspect of the right tibia. Integumentary (Hair, Skin) She has hemosiderin deposition bilaterally in her anterior legs but no other suspicious rashes. Assessment Plan Dressing Change Frequency: Change dressing every week Follow-up Appointments: Return Appointment in 1 week. Edema Control: Elevate legs to the level of the heart and pump ankles as often as possible Services and Therapies ordered were: Venous Duplex Doppler -  Right leg ordered were: DVT Study - Right Leg #1 I suspect her discomfort in the right leg is superficial thrombophlebitis. None of her wounds are open and the wound we treated her for on the right leg looks completely normal. There is no evidence of an infection, neuropathy or neuropathic pain. Although she still has the same asymmetry in her pulses in her right foot this is not childlike claudication and recent toe brachial indexes were normal. I'm going to send Lona, SIDONIE ROSANDER. ([id number]) her for venous ultrasounds question superficial thrombophlebitis. Ordinarily I might give her anti- inflammatories however she is an elderly woman on prednisone and I'm not sure that I want to take this risk. I will rapper and a Kerlix Coban and that might indeed help. Also here again in a week Electronic Signature(s) Signed: 02/27/2016 7:54:01 AM By: Linton Ham MD Entered By: Linton Ham on 02/26/2016 10:00:16 Helget, [name] ([id number]) -------------------------------------------------------------------------------- SuperBill Details Patient Name: [name] Date of Service: 02/26/2016 Medical Record Patient Account Number:  [id number] Number: Treating RN: Ahmed Prima Jun 11, 1929 (82 y.o. 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

## 2016-02-28 NOTE — Progress Notes (Signed)
RAEVAN, DELIO ([id number]) Visit Report for 02/27/2016 Arrival Information Details Patient Name: BENTLEY, CERAVOLO. Date of Service: 02/27/2016 8:00 AM Medical Record Patient Account Number: [id number] Number: Treating RN: Baruch Gouty, RN, BSN, Velva Harman 04/27/1929 [phone] y.o. Other Clinician: Date of Birth/Sex: Female) Treating ROBSON, MICHAEL Primary Care Physician/Extender: Carolan Shiver, ERIC Physician: Referring Physician: Caryl Bis, ERIC Weeks in Treatment: 4 Visit Information History Since Last Visit All ordered tests and consults were completed: No Patient Arrived: Ambulatory Added or deleted any medications: No Arrival Time: 08:14 Any new allergies or adverse reactions: No Accompanied By: self Had a fall or experienced change in No Transfer Assistance: None activities of daily living that may affect Patient Identification Verified: Yes risk of falls: Secondary Verification Process Yes Signs or symptoms of abuse/neglect since last No Completed: visito Patient Has Alerts: No Hospitalized since last visit: No Has Dressing in Place as Prescribed: Yes Pain Present Now: Yes Electronic Signature(s) Signed: 02/27/2016 3:21:23 PM By: Regan Lemming BSN, RN Entered By: Regan Lemming on 02/27/2016 08:16:00 [place], [name] ([id number]) -------------------------------------------------------------------------------- Clinic Level of Care Assessment Details Patient Name: [name] Date of Service: 02/27/2016 8:00 AM Medical Record Patient Account Number: [id number] Number: Treating RN: Baruch Gouty, RN, BSN, Rita 08-24-1928 (82 y.o. Other Clinician: Date of Birth/Sex: Female) Treating ROBSON, MICHAEL Primary Care Physician/Extender: Carolan Shiver, ERIC Physician: Referring Physician: Caryl Bis, ERIC Weeks in Treatment: 4 Clinic Level of Care Assessment Items TOOL 4 Quantity Score []  - Use when only an EandM is performed on FOLLOW-UP visit 0 ASSESSMENTS - Nursing  Assessment / Reassessment X - Reassessment of Co-morbidities (includes updates in patient status) 1 10 X - Reassessment of Adherence to Treatment Plan 1 5 ASSESSMENTS - Wound and Skin Assessment / Reassessment []  - Simple Wound Assessment / Reassessment - one wound 0 []  - Complex Wound Assessment / Reassessment - multiple wounds 0 []  - Dermatologic / Skin Assessment (not related to wound area) 0 ASSESSMENTS - Focused Assessment []  - Circumferential Edema Measurements - multi extremities 0 []  - Nutritional Assessment / Counseling / Intervention 0 X - Lower Extremity Assessment (monofilament, tuning fork, pulses) 1 5 []  - Peripheral Arterial Disease Assessment (using hand held doppler) 0 ASSESSMENTS - Ostomy and/or Continence Assessment and Care []  - Incontinence Assessment and Management 0 []  - Ostomy Care Assessment and Management (repouching, etc.) 0 PROCESS - Coordination of Care X - Simple Patient / Family Education for ongoing care 1 15 []  - Complex (extensive) Patient / Family Education for ongoing care 0 []  - Staff obtains Consents, Records, Test Results / Process Orders 0 Pruett, Hoven W. ([id number]) []  - Staff telephones HHA, Nursing Homes / Clarify orders / etc 0 []  - Routine Transfer to another Facility (non-emergent condition) 0 []  - Routine Hospital Admission (non-emergent condition) 0 []  - New Admissions / Biomedical engineer / Ordering NPWT, Apligraf, etc. 0 []  - Emergency Hospital Admission (emergent condition) 0 []  - Simple Discharge Coordination 0 []  - Complex (extensive) Discharge Coordination 0 PROCESS - Special Needs []  - Pediatric / Minor Patient Management 0 []  - Isolation Patient Management 0 []  - Hearing / Language / Visual special needs 0 []  - Assessment of Community assistance (transportation, D/C planning, etc.) 0 []  - Additional assistance / Altered mentation 0 []  - Support Surface(s) Assessment (bed, cushion, seat, etc.) 0 INTERVENTIONS - Wound  Cleansing / Measurement []  - Simple Wound Cleansing - one wound 0 []  - Complex Wound Cleansing - multiple wounds 0 []  - Wound Imaging (photographs -  any number of wounds) 0 []  - Wound Tracing (instead of photographs) 0 []  - Simple Wound Measurement - one wound 0 []  - Complex Wound Measurement - multiple wounds 0 INTERVENTIONS - Wound Dressings []  - Small Wound Dressing one or multiple wounds 0 []  - Medium Wound Dressing one or multiple wounds 0 []  - Large Wound Dressing one or multiple wounds 0 []  - Application of Medications - topical 0 []  - Application of Medications - injection 0 Degroff, Contrina W. ([id number]) INTERVENTIONS - Miscellaneous []  - External ear exam 0 []  - Specimen Collection (cultures, biopsies, blood, body fluids, etc.) 0 []  - Specimen(s) / Culture(s) sent or taken to Lab for analysis 0 []  - Patient Transfer (multiple staff / Harrel Lemon Lift / Similar devices) 0 []  - Simple Staple / Suture removal (25 or less) 0 []  - Complex Staple / Suture removal (26 or more) 0 []  - Hypo / Hyperglycemic Management (close monitor of Blood Glucose) 0 []  - Ankle / Brachial Index (ABI) - do not check if billed separately 0 X - Vital Signs 1 5 Has the patient been seen at the hospital within the last three years: Yes Total Score: 40 Level Of Care: New/Established - Level 2 Electronic Signature(s) Signed: 02/27/2016 3:21:23 PM By: Regan Lemming BSN, RN Entered By: Regan Lemming on 02/27/2016 08:27:20 [place], [name] ([id number]) -------------------------------------------------------------------------------- Encounter Discharge Information Details Patient Name: [name] Date of Service: 02/27/2016 8:00 AM Medical Record Patient Account Number: [id number] Number: Treating RN: Baruch Gouty, RN, BSN, Rita August 24, 1928 (82 y.o. Other Clinician: Date of Birth/Sex: Female) Treating ROBSON, MICHAEL Primary Care Physician/Extender: Carolan Shiver, ERIC Physician: Referring Physician:  Caryl Bis ERIC Weeks in Treatment: 4 Encounter Discharge Information Items Discharge Pain Level: 0 Discharge Condition: Stable Ambulatory Status: Ambulatory Discharge Destination: Home Transportation: Private Auto Accompanied By: self Schedule Follow-up Appointment: No Medication Reconciliation completed and provided to Patient/Care No Leeba Barbe: Provided on Clinical Summary of Care: 02/27/2016 Form Type Recipient Paper Patient NB Electronic Signature(s) Signed: 02/27/2016 8:33:25 AM By: Ruthine Dose Previous Signature: 02/27/2016 8:27:55 AM Version By: Regan Lemming BSN, RN Entered By: Ruthine Dose on 02/27/2016 08:33:25 Goucher, [name] ([id number]) -------------------------------------------------------------------------------- General Visit Notes Details Patient Name: [name] Date of Service: 02/27/2016 8:00 AM Medical Record Patient Account Number: [id number] Number: Treating RN: Baruch Gouty, RN, BSN, Rita 1928-09-06 (82 y.o. Other Clinician: Date of Birth/Sex: Female) Treating ROBSON, MICHAEL Primary Care Physician/Extender: Carolan Shiver, ERIC Physician: Referring Physician: Caryl Bis, ERIC Weeks in Treatment: 4 Notes This is the second visit to [facility] for pain to her right shin. Compressions were applied yesterday but she cut it off due to pain. DVT studies was done yesterday and there is no indication of dvt. MD had extensive talk with her to wear her compression stockings. And take OTC anti-inflamatory pain meds if in pain. Electronic Signature(s) Signed: 02/27/2016 3:21:23 PM By: Regan Lemming BSN, RN Entered By: Regan Lemming on 02/27/2016 08:24:48 [place], [name] ([id number]) -------------------------------------------------------------------------------- Lower Extremity Assessment Details Patient Name: [name] Date of Service: 02/27/2016 8:00 AM Medical Record Patient Account Number: [id number] Number: Treating RN: Baruch Gouty, RN,  BSN, Velva Harman 1928-09-03 [phone] y.o. Other Clinician: Date of Birth/Sex: Female) Treating ROBSON, MICHAEL Primary Care Physician/Extender: Carolan Shiver, ERIC Physician: Referring Physician: Caryl Bis, ERIC Weeks in Treatment: 4 Vascular Assessment Pulses: Posterior Tibial Dorsalis Pedis Palpable: [Right:Yes] Extremity colors, hair growth, and conditions: Extremity Color: [Right:Mottled] Hair Growth on Extremity: [Right:No] Temperature of Extremity: [Right:Warm] Capillary Refill: [Right:< 3 seconds] Electronic Signature(s) Signed:  02/27/2016 3:21:23 PM By: Regan Lemming BSN, RN Entered By: Regan Lemming on 02/27/2016 08:18:26 [place], [name] ([id number]) -------------------------------------------------------------------------------- [place] Details Patient Name: [name] Date of Service: 02/27/2016 8:00 AM Medical Record Patient Account Number: [id number] Number: Treating RN: Baruch Gouty, RN, BSN, Rita Mar 14, 1929 [phone] y.o. Other Clinician: Date of Birth/Sex: Female) Treating ROBSON, MICHAEL Primary Care Physician/Extender: Carolan Shiver, ERIC Physician: Referring Physician: Caryl Bis, ERIC Weeks in Treatment: 4 Active Inactive Electronic Signature(s) Signed: 02/27/2016 8:26:51 AM By: Regan Lemming BSN, RN Entered By: Regan Lemming on 02/27/2016 08:26:51 [place], [name] ([id number]) -------------------------------------------------------------------------------- Pain Assessment Details Patient Name: [name] Date of Service: 02/27/2016 8:00 AM Medical Record Patient Account Number: [id number] Number: Treating RN: Baruch Gouty, RN, BSN, Rita Feb 25, 1929 (82 y.o. Other Clinician: Date of Birth/Sex: Female) Treating ROBSON, MICHAEL Primary Care Physician/Extender: Carolan Shiver, ERIC Physician: Referring Physician: Caryl Bis, ERIC Weeks in Treatment: 4 Active Problems Location of Pain Severity and Description of Pain Patient Has Paino  Yes Site Locations Pain Location: Generalized Pain With Dressing Change: No Rate the pain. Current Pain Level: 6 Worst Pain Level: 6 Character of Pain Describe the Pain: Tender Pain Management and Medication Current Pain Management: Rest: Yes How does your pain impact your activities of daily livingo Sleep: Yes Bathing: Yes Appetite: Yes Relationship With Others: Yes Bladder Continence: Yes Emotions: Yes Bowel Continence: Yes Work: Yes Toileting: Yes Drive: Yes Dressing: Yes Hobbies: Yes Electronic Signature(s) Signed: 02/27/2016 3:21:23 PM By: Regan Lemming BSN, RN Entered By: Regan Lemming on 02/27/2016 08:16:28 Atienza, [name] ([id number]) Zarzycki, [name] ([id number]) -------------------------------------------------------------------------------- Patient/Caregiver Education Details Patient Name: [name] Date of Service: 02/27/2016 8:00 AM Medical Record Patient Account Number: [id number] Number: Treating RN: Baruch Gouty, RN, BSN, Rita 10/05/1928 (82 y.o. Other Clinician: Date of Birth/Gender: Female) Treating ROBSON, MICHAEL Primary Care Physician/Extender: Carolan Shiver, ERIC Physician: [place]ella [place] in Treatment: 4 Referring Physician: Caryl Bis, ERIC Education Assessment Education Provided To: Patient Education Topics Provided Wound Debridement: Methods: Explain/Verbal Responses: State content correctly Wound/Skin Impairment: Methods: Explain/Verbal Responses: State content correctly Electronic Signature(s) Signed: 02/27/2016 3:21:23 PM By: Regan Lemming BSN, RN Entered By: Regan Lemming on 02/27/2016 08:28:12 Sarti, [name] ([id number]) -------------------------------------------------------------------------------- Vitals Details Patient Name: [name] Date of Service: 02/27/2016 8:00 AM Medical Record Patient Account Number: [id number] Number: Treating RN: Baruch Gouty, RN, BSN, Rita 21-Dec-1928 (82 y.o. Other Clinician: Date of  Birth/Sex: Female) Treating ROBSON, MICHAEL Primary Care Physician/Extender: Carolan Shiver, ERIC Physician: Referring Physician: Caryl Bis, ERIC Weeks in Treatment: 4 Vital Signs Time Taken: 08:17 Temperature (F): 98.1 Height (in): 64 Pulse (bpm): 70 Weight (lbs): 128 Respiratory Rate (breaths/min): 17 Body Mass Index (BMI): 22 Blood Pressure (mmHg): 115/68 Reference Range: 80 - 120 mg / dl Electronic Signature(s) Signed: 02/27/2016 3:21:23 PM By: Regan Lemming BSN, RN Entered By: Regan Lemming on 02/27/2016 08:18:09

## 2016-02-28 NOTE — Progress Notes (Signed)
Suzanne, Kelly ([id number]) Visit Report for 02/27/2016 Chief Complaint Document Details Patient Name: [name], [name]. Date of Service: 02/27/2016 8:00 AM Medical Record Patient Account Number: [id number] Number: Treating RN: Baruch Gouty, RN, BSN, Velva Harman March 27, 1929 [phone] y.o. Other Clinician: Date of Birth/Sex: Female) Treating Ellianna Ruest Primary Care Physician/Extender: Carolan Shiver, ERIC Physician: Referring Physician: Caryl Bis, ERIC Weeks in Treatment: 4 Information Obtained from: Patient Chief Complaint Right calf traumatic ulceration. Right hand skin tear (healed). 01/30/16; patient returns to clinic with a wound on her right anterior leg that is been present for a month. Electronic Signature(s) Signed: 02/27/2016 3:57:44 PM By: Linton Ham MD Entered By: Linton Ham on 02/27/2016 08:28:52 Mattox, [name] ([id number]) -------------------------------------------------------------------------------- HPI Details Patient Name: [name] Date of Service: 02/27/2016 8:00 AM Medical Record Patient Account Number: [id number] Number: Treating RN: Baruch Gouty, RN, BSN, Rita 03-28-1929 [phone] y.o. Other Clinician: Date of Birth/Sex: Female) Treating Geoffrey Mankin Primary Care Physician/Extender: Carolan Shiver, ERIC Physician: Referring Physician: Caryl Bis, ERIC Weeks in Treatment: 4 History of Present Illness HPI Description: Pleasant 82 year old with history of Arthritis;GERD (gastroesophageal reflux disease);Hypertension;Chronic kidney diseaseo;Vasculitis, ANCA positive, pulmonary hemorrhage;Thyroid disease;hyperthyroid, had radioactiveiodine treatment;Renal cell carcinoma 2003 s/p sgy for removal right kidney;Hemorrhoids.o She has also had several surgeries including abdominal hysterectomy with bilateral salpingo-oophorectomy, hernia repair, fracture of her femur on the right side, cholecystectomy, cardiac catheterization. She traumatized her right  calf at 2 locations in November 2016. More recently fell and suffered a skin tear to her right hand (healed). Ambulating per her baseline. No significant pain. Right ABI 0.75. Performing dressing changes with silver alginate and using a Tubigrip for edema control. She returns to clinic for follow-up and is without complaints other than the silver alginate sticking to her wounds. No significant pain. No fever or chills. Minimal drainage. 01/30/16; this is an 82 year old woman who was previously been in this clinic on 2 occasions. In June 2016 she had a left leg wound and a right forearm wound. The lower extremity wound I think was caused by hitting her leg on the dishwasher door and the arm on the car door. She healed in 2-3 weeks with standard therapy. She was here in December 2016 with her traumatic wound to the right calf again this healed fairly quickly. She has an ABI on the right leg "it is 0.75. Today she is noncompressible. She does have some form of stocking but I don't think wears them reliably in I don't think there prescription stockings area she has a history of an ANCA-positive vasculitis and is on prednisone. Her past medical history is reviewed. She does not complain of anything that sounds like claudication still an active woman who lives in an independent living setting about a month ago she tells me she hit her right anterior leg on a box. This caused a open wound. She is been treating this with Neosporin without improvement. She is not a diabetic. Her arterial ABI in this clinic was noncompressible the patient is been seen in this clinic 2 times before. However I was not involved in her care nor was any of the current physician group in this clinic 02/06/16; good progress/Prisma. Has been for her arterial studies although we don't have those results 02/13/16: right lower leg wound has achieved complete epithelialization. Reviewed vascular report that noted nl toe  pressures. 02/21/2016 -- patient was recently discharged from the wound center week ago and came in today saying that she has pain in her leg especially in the area  where she has the wound. The wound has not opened and there is no evidence of any redness or swelling. Suzanne, Kelly ([id number]) 02/26/16; this is a patient I saw about a month ago with a wound on her right anterior leg. She has a history of chronic venous insufficiency chronic prednisone use for ANCA positive vasculitis. Her wound healed with standard treatment. She was discharged from the clinic. She returned last week to see Dr. Con Memos complaining of pain on her anterior right leg. I don't think this was felt to be anything related to the wound on the right leg that we treated her for. She keeps her legs on pillows at night but finds the elevation makes the pain worse. She is unable to tell me anything else that makes it better. She has been taking Tylenol but then Redick causes liver problems so she hasn't been taking that. I sent her to see Dr. Fletcher Anon has of the asymmetry in her peripheral pulses. I reviewed his note, he did not feel she had significant arterial insufficiency. Although her ABIs were noncompressible in our clinic her toe brachial indexes were normal bilaterally although I don't see these results 02/27/16; the patient went for a duplex ultrasound of her right leg yesterday after phoning the clinic several times to complain of severe pain, in fact she took her Kerlix Coban wrap off. The ultrasound was negative for DVT, they didn't comment on her superficial veins presumably there was no superficial vein thrombosis. It is difficult to get a sense of the pain the patient is describing although I think it is compatible with phlebitis likely of the greater saphenous vein Electronic Signature(s) Signed: 02/27/2016 3:57:44 PM By: Linton Ham MD Entered By: Linton Ham on 02/27/2016 08:30:38 Recinos, [name]  ([id number]) -------------------------------------------------------------------------------- Physical Exam Details Patient Name: [name] Date of Service: 02/27/2016 8:00 AM Medical Record Patient Account Number: [id number] Number: Treating RN: Baruch Gouty, RN, BSN, Rita 24-Nov-1928 (82 y.o. Other Clinician: Date of Birth/Sex: Female) Treating Armetta Henri Primary Care Physician/Extender: Carolan Shiver, ERIC Physician: Referring Physician: Caryl Bis, ERIC Weeks in Treatment: 4 Constitutional Sitting or standing Blood Pressure is within target range for patient.. Pulse regular and within target range for patient.Marland Kitchen Respirations regular, non-labored and within target range.. Temperature is normal and within the target range for the patient.Marland Kitchen Respiratory Respiratory effort is easy and symmetric bilaterally. Rate is normal at rest and on room air.. Cardiovascular Her right dorsalis pedis pulse is not as strong as the left although we have previously looked into this. She has edema in the right greater than the left leg severe right sided hemosiderin deposition compatible with chronic venous insufficiency. She has tenderness along the greater saphenous vein all and perhaps some warmth but no obvious erythema. Lymphatic None palpable in the popliteal or inguinal area. Psychiatric No evidence of depression, anxiety, or agitation. Calm, cooperative, and communicative. Appropriate interactions and affect.. Notes Wound exam; there is no open wound, the recently treated wound on the right anterior lateral leg has healed Electronic Signature(s) Signed: 02/27/2016 3:57:44 PM By: Linton Ham MD Entered By: Linton Ham on 02/27/2016 08:32:58 Antonini, [name] ([id number]) -------------------------------------------------------------------------------- Physician Orders Details Patient Name: [name] Date of Service: 02/27/2016 8:00 AM Medical Record Patient  Account Number: [id number] Number: Treating RN: Baruch Gouty, RN, BSN, Rita February 01, 1929 (82 y.o. Other Clinician: Date of Birth/Sex: Female) Treating Nicandro Perrault Primary Care Physician/Extender: Carolan Shiver, ERIC Physician: Referring Physician: Caryl Bis, ERIC Weeks in Treatment: 4 Verbal / Phone  Orders: Yes Clinician: Afful, RN, BSN, Rita Read Back and Verified: Yes Diagnosis Coding Discharge From [facility] Services o Discharge from [place] Signature(s) Signed: 02/27/2016 3:21:23 PM By: Regan Lemming BSN, RN Signed: 02/27/2016 3:57:44 PM By: Linton Ham MD Entered By: Regan Lemming on 02/27/2016 08:25:58 Inge, [name] ([id number]) -------------------------------------------------------------------------------- Problem List Details Patient Name: [name] Date of Service: 02/27/2016 8:00 AM Medical Record Patient Account Number: [id number] Number: Treating RN: Baruch Gouty, RN, BSN, Rita Dec 29, 1928 (82 y.o. Other Clinician: Date of Birth/Sex: Female) Treating Amil Bouwman Primary Care Physician/Extender: Carolan Shiver, ERIC Physician: Referring Physician: Caryl Bis, ERIC Weeks in Treatment: 4 Active Problems ICD-10 Encounter Code Description Active Date Diagnosis I87.321 Chronic venous hypertension (idiopathic) with 02/27/2016 Yes inflammation of right lower extremity S85.301A Unspecified injury of greater saphenous vein at lower leg 02/27/2016 Yes level, right leg, initial encounter Inactive Problems Resolved Problems Electronic Signature(s) Signed: 02/27/2016 3:57:44 PM By: Linton Ham MD Entered By: Linton Ham on 02/27/2016 08:28:37 Wilford, [name] ([id number]) -------------------------------------------------------------------------------- Progress Note Details Patient Name: [name] Date of Service: 02/27/2016 8:00 AM Medical Record Patient Account Number: [id number] Number: Treating RN:  Baruch Gouty, RN, BSN, Rita 02/11/1929 (82 y.o. Other Clinician: Date of Birth/Sex: Female) Treating Lucianne Smestad Primary Care Physician/Extender: Carolan Shiver, ERIC Physician: Referring Physician: Caryl Bis, ERIC Weeks in Treatment: 4 Subjective Chief Complaint Information obtained from Patient Right calf traumatic ulceration. Right hand skin tear (healed). 01/30/16; patient returns to clinic with a wound on her right anterior leg that is been present for a month. History of Present Illness (HPI) Pleasant 82 year old with history of Arthritis;GERD (gastroesophageal reflux disease);Hypertension;Chronic kidney disease ;Vasculitis, ANCA positive, pulmonary hemorrhage;Thyroid disease;hyperthyroid, had radioactiveiodine treatment;Renal cell carcinoma 2003 s/p sgy for removal right kidney;Hemorrhoids. She has also had several surgeries including abdominal hysterectomy with bilateral salpingo-oophorectomy, hernia repair, fracture of her femur on the right side, cholecystectomy, cardiac catheterization. She traumatized her right calf at 2 locations in November 2016. More recently fell and suffered a skin tear to her right hand (healed). Ambulating per her baseline. No significant pain. Right ABI 0.75. Performing dressing changes with silver alginate and using a Tubigrip for edema control. She returns to clinic for follow-up and is without complaints other than the silver alginate sticking to her wounds. No significant pain. No fever or chills. Minimal drainage. 01/30/16; this is an 82 year old woman who was previously been in this clinic on 2 occasions. In June 2016 she had a left leg wound and a right forearm wound. The lower extremity wound I think was caused by hitting her leg on the dishwasher door and the arm on the car door. She healed in 2-3 weeks with standard therapy. She was here in December 2016 with her traumatic wound to the right calf again this healed fairly quickly. She has an ABI on  the right leg "it is 0.75. Today she is noncompressible. She does have some form of stocking but I don't think wears them reliably in I don't think there prescription stockings area she has a history of an ANCA-positive vasculitis and is on prednisone. Her past medical history is reviewed. She does not complain of anything that sounds like claudication still an active woman who lives in an independent living setting about a month ago she tells me she hit her right anterior leg on a box. This caused a open wound. She is been treating this with Neosporin without improvement. She is not a diabetic. Her arterial ABI in this clinic was  noncompressible the patient is been seen in this clinic 2 times before. However I was not involved in her care nor was any of the current physician group in this clinic 02/06/16; good progress/Prisma. Has been for her arterial studies although we don't have those results ROZALIE, COMOLLI. ([id number]) 02/13/16: right lower leg wound has achieved complete epithelialization. Reviewed vascular report that noted nl toe pressures. 02/21/2016 -- patient was recently discharged from the wound center week ago and came in today saying that she has pain in her leg especially in the area where she has the wound. The wound has not opened and there is no evidence of any redness or swelling. 02/26/16; this is a patient I saw about a month ago with a wound on her right anterior leg. She has a history of chronic venous insufficiency chronic prednisone use for ANCA positive vasculitis. Her wound healed with standard treatment. She was discharged from the clinic. She returned last week to see Dr. Con Memos complaining of pain on her anterior right leg. I don't think this was felt to be anything related to the wound on the right leg that we treated her for. She keeps her legs on pillows at night but finds the elevation makes the pain worse. She is unable to tell me anything else that makes it  better. She has been taking Tylenol but then Redick causes liver problems so she hasn't been taking that. I sent her to see Dr. Fletcher Anon has of the asymmetry in her peripheral pulses. I reviewed his note, he did not feel she had significant arterial insufficiency. Although her ABIs were noncompressible in our clinic her toe brachial indexes were normal bilaterally although I don't see these results 02/27/16; the patient went for a duplex ultrasound of her right leg yesterday after phoning the clinic several times to complain of severe pain, in fact she took her Kerlix Coban wrap off. The ultrasound was negative for DVT, they didn't comment on her superficial veins presumably there was no superficial vein thrombosis. It is difficult to get a sense of the pain the patient is describing although I think it is compatible with phlebitis likely of the greater saphenous vein Objective Constitutional Sitting or standing Blood Pressure is within target range for patient.. Pulse regular and within target range for patient.Marland Kitchen Respirations regular, non-labored and within target range.. Temperature is normal and within the target range for the patient.. Vitals Time Taken: 8:17 AM, Height: 64 in, Weight: 128 lbs, BMI: 22, Temperature: 98.1 F, Pulse: 70 bpm, Respiratory Rate: 17 breaths/min, Blood Pressure: 115/68 mmHg. Respiratory Respiratory effort is easy and symmetric bilaterally. Rate is normal at rest and on room air.. Cardiovascular Her right dorsalis pedis pulse is not as strong as the left although we have previously looked into this. She has edema in the right greater than the left leg severe right sided hemosiderin deposition compatible with chronic venous insufficiency. She has tenderness along the greater saphenous vein all and perhaps some warmth but no obvious erythema. Lymphatic None palpable in the popliteal or inguinal area. Rebstock, Hamsini W. ([id number]) Psychiatric No evidence of  depression, anxiety, or agitation. Calm, cooperative, and communicative. Appropriate interactions and affect.. General Notes: Wound exam; there is no open wound, the recently treated wound on the right anterior lateral leg has healed Assessment Active Problems ICD-10 I87.321 - Chronic venous hypertension (idiopathic) with inflammation of right lower extremity S85.301A - Unspecified injury of greater saphenous vein at lower leg level, right leg, initial encounter Plan  Discharge From [facility] Services: Discharge from [place] #1 I think this patient has superficial phlebitis [without thrombosis] at least no thrombosis commented on her duplex ultrasound. She does not have a DVT my sense of this is that this is not a serious issue which could be managed with compression stockings and when necessary NSAIDs. She is an elderly woman on prednisone I have cautioned about using over-the-counter NSAIDs in excess but I think that would help with her pain. #2 I think she can be read discharged from the clinic I don't think there is an obvious serious ongoing issue however I've encouraged her to see her primary physician if this is an ongoing issue Electronic Signature(s) Signed: 02/27/2016 3:57:44 PM By: Linton Ham MD Entered By: Linton Ham on 02/27/2016 08:34:19 Cozzens, [name] ([id number]) Lewey, [name] ([id number]) -------------------------------------------------------------------------------- Balm Details Patient Name: [name] Date of Service: 02/27/2016 Medical Record Patient Account Number: [id number] Number: Treating RN: Baruch Gouty, RN, BSN, Rita Oct 29, 1928 [phone] y.o. Other Clinician: Date of Birth/Sex: Female) Treating Julian Askin Primary Care Physician/Extender: Carolan Shiver, ERIC Physician: Suella Grove in Treatment: 4 Referring Physician: Caryl Bis, ERIC Diagnosis Coding ICD-10 Codes Code Description I87.321 Chronic venous hypertension  (idiopathic) with inflammation of right lower extremity S85.301A Unspecified injury of greater saphenous vein at lower leg level, right leg, initial encounter Facility Procedures CPT4 Code: [id number] Description: [id number] - WOUND CARE VISIT-LEV 2 EST PT Modifier: Quantity: 1 Physician Procedures CPT4: Description Modifier Quantity Code [id number] - WC PHYS LEVEL 3 - EST PT 1 ICD-10 Description Diagnosis S85.301A Unspecified injury of greater saphenous vein at lower leg level, right leg, initial encounter Electronic Signature(s) Signed: 02/27/2016 3:57:44 PM By: Linton Ham MD Entered By: Linton Ham on 02/27/2016 08:34:48

## 2016-03-04 ENCOUNTER — Telehealth: Payer: Self-pay | Admitting: Family Medicine

## 2016-03-04 NOTE — Telephone Encounter (Signed)
Advise this is your 830am patient, thanks

## 2016-03-04 NOTE — Telephone Encounter (Signed)
[facility]  Patient Name: [name]  DOB: 02-07-1929    Initial Comment Caller states calling for appt. Last two weeks has been at dr for leg. Starting yesterday, has been dizzy and weak   Nurse Assessment  Nurse: Harlow Mares, RN, Suanne Marker Date/Time (Eastern Time): 03/04/2016 4:03:18 PM  Confirm and document reason for call. If symptomatic, describe symptoms. You must click the next button to save text entered. ---Caller states calling for appt. Last two weeks has been at dr for leg. Starting yesterday, has been dizzy and weak. She has been to the wound center (she has a wound on her leg from an injury and was trying to get this healed). She has been seeing the wound center for about 1 month. She has had an ultrasound due to weakened pulses on the foot. She then let a suitcase fall on her leg and this has set her back (occurred 3 weeks ago). She is now weak and she is in pain. Denies fever today but has been running a fever off/on. The wound is no longer open but she continues to have pain.  Has the patient traveled out of the country within the last 30 days? ---No  Does the patient have any new or worsening symptoms? ---Yes  Will a triage be completed? ---Yes  Related visit to physician within the last 2 weeks? ---Yes  Does the PT have any chronic conditions? (i.e. diabetes, asthma, etc.) ---Yes  List chronic conditions. ---HTN  Is this a behavioral health or substance abuse call? ---No     Guidelines    Guideline Title Affirmed Question Affirmed Notes  Dizziness - Lightheadedness [1] MODERATE dizziness (e.g., interferes with normal activities) AND [2] has NOT been evaluated by physician for this (Exception: dizziness caused by heat exposure, sudden standing, or poor fluid intake)    Final Disposition User   See Physician within [place], RN, [employer] reports that she has an appt scheduled for tomorrow morning at the  [facility] office with Dr. Caryl Bis at 8:30am, this was confirmed. Advised caller to call back for any new/worsening symptoms.   Referrals  REFERRED TO PCP OFFICE   Disagree/Comply: Comply

## 2016-03-05 ENCOUNTER — Ambulatory Visit
Admission: RE | Admit: 2016-03-05 | Discharge: 2016-03-05 | Disposition: A | Discharge status: Home / Self Care | Payer: Medicare Other | Source: Ambulatory Visit | Attending: Family Medicine | Admitting: Family Medicine

## 2016-03-05 ENCOUNTER — Encounter: Payer: Self-pay | Admitting: Family Medicine

## 2016-03-05 ENCOUNTER — Telehealth: Payer: Self-pay | Admitting: Family Medicine

## 2016-03-05 ENCOUNTER — Encounter: Payer: Self-pay | Admitting: Emergency Medicine

## 2016-03-05 ENCOUNTER — Inpatient Hospital Stay
Admission: EM | Admit: 2016-03-05 | Discharge: 2016-03-07 | DRG: 641 | Disposition: A | Discharge status: Home / Self Care | Payer: Medicare Other | Attending: Internal Medicine | Admitting: Internal Medicine

## 2016-03-05 ENCOUNTER — Other Ambulatory Visit: Payer: Self-pay

## 2016-03-05 ENCOUNTER — Ambulatory Visit (INDEPENDENT_AMBULATORY_CARE_PROVIDER_SITE_OTHER): Payer: Medicare Other | Admitting: Family Medicine

## 2016-03-05 ENCOUNTER — Ambulatory Visit (INDEPENDENT_AMBULATORY_CARE_PROVIDER_SITE_OTHER): Payer: Medicare Other

## 2016-03-05 VITALS — BP 116/78 | HR 66 | Temp 98.2°F | Wt 122.6 lb

## 2016-03-05 DIAGNOSIS — N189 Chronic kidney disease, unspecified: Secondary | ICD-10-CM | POA: Diagnosis present

## 2016-03-05 DIAGNOSIS — I776 Arteritis, unspecified: Secondary | ICD-10-CM | POA: Diagnosis present

## 2016-03-05 DIAGNOSIS — I129 Hypertensive chronic kidney disease with stage 1 through stage 4 chronic kidney disease, or unspecified chronic kidney disease: Secondary | ICD-10-CM | POA: Diagnosis present

## 2016-03-05 DIAGNOSIS — Z7952 Long term (current) use of systemic steroids: Secondary | ICD-10-CM

## 2016-03-05 DIAGNOSIS — J329 Chronic sinusitis, unspecified: Secondary | ICD-10-CM

## 2016-03-05 DIAGNOSIS — Z87440 Personal history of urinary (tract) infections: Secondary | ICD-10-CM | POA: Diagnosis not present

## 2016-03-05 DIAGNOSIS — R0781 Pleurodynia: Secondary | ICD-10-CM | POA: Diagnosis not present

## 2016-03-05 DIAGNOSIS — Z86010 Personal history of colonic polyps: Secondary | ICD-10-CM

## 2016-03-05 DIAGNOSIS — R42 Dizziness and giddiness: Secondary | ICD-10-CM | POA: Diagnosis present

## 2016-03-05 DIAGNOSIS — Z888 Allergy status to other drugs, medicaments and biological substances status: Secondary | ICD-10-CM | POA: Diagnosis not present

## 2016-03-05 DIAGNOSIS — Z79899 Other long term (current) drug therapy: Secondary | ICD-10-CM | POA: Diagnosis not present

## 2016-03-05 DIAGNOSIS — N390 Urinary tract infection, site not specified: Secondary | ICD-10-CM | POA: Diagnosis present

## 2016-03-05 DIAGNOSIS — Z9049 Acquired absence of other specified parts of digestive tract: Secondary | ICD-10-CM

## 2016-03-05 DIAGNOSIS — R269 Unspecified abnormalities of gait and mobility: Secondary | ICD-10-CM | POA: Insufficient documentation

## 2016-03-05 DIAGNOSIS — Z8249 Family history of ischemic heart disease and other diseases of the circulatory system: Secondary | ICD-10-CM

## 2016-03-05 DIAGNOSIS — Z880 Allergy status to penicillin: Secondary | ICD-10-CM

## 2016-03-05 DIAGNOSIS — S0990XA Unspecified injury of head, initial encounter: Secondary | ICD-10-CM | POA: Diagnosis not present

## 2016-03-05 DIAGNOSIS — Z85528 Personal history of other malignant neoplasm of kidney: Secondary | ICD-10-CM | POA: Diagnosis not present

## 2016-03-05 DIAGNOSIS — K219 Gastro-esophageal reflux disease without esophagitis: Secondary | ICD-10-CM | POA: Diagnosis present

## 2016-03-05 DIAGNOSIS — E876 Hypokalemia: Secondary | ICD-10-CM | POA: Diagnosis present

## 2016-03-05 DIAGNOSIS — Z9889 Other specified postprocedural states: Secondary | ICD-10-CM | POA: Diagnosis not present

## 2016-03-05 DIAGNOSIS — Z8261 Family history of arthritis: Secondary | ICD-10-CM | POA: Diagnosis not present

## 2016-03-05 DIAGNOSIS — W010XXA Fall on same level from slipping, tripping and stumbling without subsequent striking against object, initial encounter: Secondary | ICD-10-CM | POA: Diagnosis present

## 2016-03-05 DIAGNOSIS — M1990 Unspecified osteoarthritis, unspecified site: Secondary | ICD-10-CM | POA: Diagnosis present

## 2016-03-05 DIAGNOSIS — G319 Degenerative disease of nervous system, unspecified: Secondary | ICD-10-CM | POA: Insufficient documentation

## 2016-03-05 DIAGNOSIS — E861 Hypovolemia: Secondary | ICD-10-CM | POA: Diagnosis present

## 2016-03-05 DIAGNOSIS — E871 Hypo-osmolality and hyponatremia: Secondary | ICD-10-CM | POA: Diagnosis present

## 2016-03-05 DIAGNOSIS — S0101XA Laceration without foreign body of scalp, initial encounter: Secondary | ICD-10-CM

## 2016-03-05 LAB — CBC WITH DIFFERENTIAL/PLATELET
BASOS PCT: 0 %
Basophils Absolute: 0 10*3/uL (ref 0–0.1)
EOS ABS: 0 10*3/uL (ref 0–0.7)
EOS PCT: 0 %
HEMATOCRIT: 38 % (ref 35.0–47.0)
Hemoglobin: 13.4 g/dL (ref 12.0–16.0)
Lymphocytes Relative: 9 %
Lymphs Abs: 0.8 10*3/uL — ABNORMAL LOW (ref 1.0–3.6)
MCH: 32.6 pg (ref 26.0–34.0)
MCHC: 35.4 g/dL (ref 32.0–36.0)
MCV: 92 fL (ref 80.0–100.0)
MONO ABS: 0.7 10*3/uL (ref 0.2–0.9)
MONOS PCT: 8 %
Neutro Abs: 7.1 10*3/uL — ABNORMAL HIGH (ref 1.4–6.5)
Neutrophils Relative %: 83 %
PLATELETS: 180 10*3/uL (ref 150–440)
RBC: 4.13 MIL/uL (ref 3.80–5.20)
RDW: 13.3 % (ref 11.5–14.5)
WBC: 8.6 10*3/uL (ref 3.6–11.0)

## 2016-03-05 LAB — GLUCOSE, CAPILLARY: GLUCOSE-CAPILLARY: 115 mg/dL — AB (ref 65–99)

## 2016-03-05 LAB — COMPREHENSIVE METABOLIC PANEL
ALBUMIN: 3.8 g/dL (ref 3.5–5.0)
ALT: 11 U/L (ref 0–35)
ALT: 13 U/L — ABNORMAL LOW (ref 14–54)
ANION GAP: 13 (ref 5–15)
AST: 18 U/L (ref 0–37)
AST: 20 U/L (ref 15–41)
Albumin: 4 g/dL (ref 3.5–5.2)
Alkaline Phosphatase: 93 U/L (ref 39–117)
Alkaline Phosphatase: 89 U/L (ref 38–126)
BILIRUBIN TOTAL: 1.1 mg/dL (ref 0.3–1.2)
BUN: 22 mg/dL (ref 6–23)
BUN: 21 mg/dL — ABNORMAL HIGH (ref 6–20)
CHLORIDE: 84 meq/L — AB (ref 96–112)
CHLORIDE: 86 mmol/L — AB (ref 101–111)
CO2: 24 meq/L (ref 19–32)
CO2: 24 mmol/L (ref 22–32)
Calcium: 9.3 mg/dL (ref 8.4–10.5)
Calcium: 9 mg/dL (ref 8.9–10.3)
Creatinine, Ser: 1.05 mg/dL (ref 0.40–1.20)
Creatinine, Ser: 0.88 mg/dL (ref 0.44–1.00)
GFR calc Af Amer: >60 mL/min (ref >60)
GFR, EST NON AFRICAN AMERICAN: 59 mL/min — AB (ref >60)
GFR: 52.95 mL/min — AB (ref >60.00)
GLUCOSE: 105 mg/dL — AB (ref 65–99)
Glucose, Bld: 97 mg/dL (ref 70–99)
POTASSIUM: 3.3 mmol/L — AB (ref 3.5–5.1)
Potassium: 4.1 mEq/L (ref 3.5–5.1)
Sodium: 119 mEq/L — CL (ref 135–145)
Sodium: 123 mmol/L — ABNORMAL LOW (ref 135–145)
TOTAL PROTEIN: 6.4 g/dL — AB (ref 6.5–8.1)
Total Bilirubin: 1 mg/dL (ref 0.2–1.2)
Total Protein: 6.9 g/dL (ref 6.0–8.3)

## 2016-03-05 LAB — URINALYSIS COMPLETE WITH MICROSCOPIC (ARMC ONLY)
BILIRUBIN URINE: NEGATIVE
Glucose, UA: NEGATIVE mg/dL
HGB URINE DIPSTICK: NEGATIVE
KETONES UR: NEGATIVE mg/dL
NITRITE: NEGATIVE
PH: 6 (ref 5.0–8.0)
Protein, ur: NEGATIVE mg/dL
Specific Gravity, Urine: 1.003 — ABNORMAL LOW (ref 1.005–1.030)
Squamous Epithelial / LPF: NONE SEEN

## 2016-03-05 LAB — CBC
HCT: 37.8 % (ref 36.0–46.0)
Hemoglobin: 12.9 g/dL (ref 12.0–15.0)
MCHC: 34.2 g/dL (ref 30.0–36.0)
MCV: 94.4 fl (ref 78.0–100.0)
Platelets: 193 10*3/uL (ref 150.0–400.0)
RBC: 4.01 Mil/uL (ref 3.87–5.11)
RDW: 13.6 % (ref 11.5–15.5)
WBC: 7 10*3/uL (ref 4.0–10.5)

## 2016-03-05 LAB — TSH: TSH: 1.506 u[IU]/mL (ref 0.350–4.500)

## 2016-03-05 MED ORDER — PREDNISONE 10 MG PO TABS
5.0000 mg | ORAL_TABLET | Freq: Every day | ORAL | Status: DC
  Administered 2016-03-05 – 2016-03-07 (×3): 5 mg via ORAL
  Filled 2016-03-05 (×3): qty 1

## 2016-03-05 MED ORDER — SODIUM CHLORIDE 0.9 % IV SOLN
INTRAVENOUS | Status: DC
  Administered 2016-03-05 – 2016-03-06 (×2): via INTRAVENOUS

## 2016-03-05 MED ORDER — FLUTICASONE PROPIONATE 50 MCG/ACT NA SUSP
1.0000 | Freq: Every day | NASAL | Status: DC
  Administered 2016-03-06 – 2016-03-07 (×2): 1 via NASAL
  Filled 2016-03-05: qty 16

## 2016-03-05 MED ORDER — ACETAMINOPHEN 325 MG PO TABS
650.0000 mg | ORAL_TABLET | Freq: Four times a day (QID) | ORAL | Status: DC | PRN
  Administered 2016-03-06 – 2016-03-07 (×2): 650 mg via ORAL
  Filled 2016-03-05 (×2): qty 2

## 2016-03-05 MED ORDER — ACETAMINOPHEN 650 MG RE SUPP
650.0000 mg | Freq: Four times a day (QID) | RECTAL | Status: DC | PRN
  Filled 2016-03-05: qty 1

## 2016-03-05 MED ORDER — LISINOPRIL 10 MG PO TABS
10.0000 mg | ORAL_TABLET | Freq: Every day | ORAL | Status: DC
  Administered 2016-03-05 – 2016-03-07 (×3): 10 mg via ORAL
  Filled 2016-03-05 (×3): qty 1

## 2016-03-05 MED ORDER — METOPROLOL TARTRATE 25 MG PO TABS
12.5000 mg | ORAL_TABLET | Freq: Two times a day (BID) | ORAL | Status: DC
  Administered 2016-03-05 – 2016-03-07 (×4): 12.5 mg via ORAL
  Filled 2016-03-05 (×4): qty 1

## 2016-03-05 MED ORDER — VITAMIN E 180 MG (400 UNIT) PO CAPS
400.0000 [IU] | ORAL_CAPSULE | Freq: Every day | ORAL | Status: DC
  Administered 2016-03-05 – 2016-03-07 (×3): 400 [IU] via ORAL
  Filled 2016-03-05 (×3): qty 1

## 2016-03-05 MED ORDER — LIDOCAINE-EPINEPHRINE (PF) 1 %-1:200000 IJ SOLN
30.0000 mL | Freq: Once | INTRAMUSCULAR | Status: DC
  Filled 2016-03-05: qty 30

## 2016-03-05 MED ORDER — OXYCODONE HCL 5 MG PO TABS: 5.0000 mg | ORAL_TABLET | ORAL | Status: DC | PRN

## 2016-03-05 MED ORDER — VITAMIN B-12 1000 MCG PO TABS
2000.0000 ug | ORAL_TABLET | Freq: Every day | ORAL | Status: DC
  Administered 2016-03-05 – 2016-03-07 (×3): 2000 ug via ORAL
  Filled 2016-03-05 (×3): qty 2

## 2016-03-05 MED ORDER — POLYETHYLENE GLYCOL 3350 17 G PO PACK
17.0000 g | PACK | Freq: Every day | ORAL | Status: DC
  Administered 2016-03-05 – 2016-03-07 (×3): 17 g via ORAL
  Filled 2016-03-05 (×3): qty 1

## 2016-03-05 MED ORDER — SODIUM CHLORIDE 0.9 % IV SOLN
Freq: Once | INTRAVENOUS | Status: AC
  Administered 2016-03-05: 15:00:00 via INTRAVENOUS

## 2016-03-05 MED ORDER — RISAQUAD PO CAPS
1.0000 | ORAL_CAPSULE | Freq: Every day | ORAL | Status: DC
  Administered 2016-03-05 – 2016-03-07 (×3): 1 via ORAL
  Filled 2016-03-05 (×3): qty 1

## 2016-03-05 MED ORDER — HYDROCORTISONE ACE-PRAMOXINE 2.5-1 % RE CREA
1.0000 "application " | TOPICAL_CREAM | Freq: Three times a day (TID) | RECTAL | Status: DC
  Filled 2016-03-05: qty 30

## 2016-03-05 MED ORDER — POTASSIUM CHLORIDE CRYS ER 20 MEQ PO TBCR
40.0000 meq | EXTENDED_RELEASE_TABLET | Freq: Once | ORAL | Status: AC
  Administered 2016-03-05: 40 meq via ORAL
  Filled 2016-03-05: qty 2

## 2016-03-05 MED ORDER — ENOXAPARIN SODIUM 40 MG/0.4ML ~~LOC~~ SOLN
40.0000 mg | SUBCUTANEOUS | Status: DC
  Administered 2016-03-05 – 2016-03-06 (×2): 40 mg via SUBCUTANEOUS
  Filled 2016-03-05 (×2): qty 0.4

## 2016-03-05 MED ORDER — OCUVITE-LUTEIN PO CAPS
1.0000 | ORAL_CAPSULE | Freq: Every day | ORAL | Status: DC
  Administered 2016-03-05 – 2016-03-07 (×3): 1 via ORAL
  Filled 2016-03-05 (×3): qty 1
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
## 2016-03-05 NOTE — H&P (Signed)
[place] [facility] [place] NAME: [name]    MR#:  [id number]  DATE OF BIRTH:  04/24/1929   DATE OF ADMISSION:  03/05/2016  PRIMARY CARE PHYSICIAN: Tommi Rumps, MD   REQUESTING/REFERRING PHYSICIAN: Jimmye Norman  CHIEF COMPLAINT:   Chief Complaint  Patient presents with  . abnormal labs    HISTORY OF PRESENT ILLNESS:  [name]  is a 82 y.o. female with a known history of Vasculitis on chronic steroids, essential hypertension who is presenting with weakness and mechanical fall. She states for the last few weeks she's been "wobbly" and suffered a fall today while drinking Dr. Malachi Bonds. No loss of consciousness. Did suffer a minor head trauma with laceration. Had routine lab work performed found to be hyponatremic with sodium in the low 120 range. She states poor appetite, adequate fluid intake mainly consisting of Dr. Malachi Bonds, coffee, water. No further symptoms.  PAST MEDICAL HISTORY:   Past Medical History:  Diagnosis Date  . Arthritis   . Chronic kidney disease   . Colon polyp   . GERD (gastroesophageal reflux disease)   . Hemorrhoids   . Hypertension   . Renal cell carcinoma ([place]) 2003   s/p sgy for removal right kidney  . Thyroid disease 1957   hyperthyroid, had radioactiveiodine treatment   . UTI (lower urinary tract infection)   . Vasculitis ([place])    ANCA positive, pulmonary hemorrhage    PAST SURGICAL HISTORY:   Past Surgical History:  Procedure Laterality Date  . ABDOMINAL HYSTERECTOMY    . BILATERAL SALPINGOOPHORECTOMY    . CHOLECYSTECTOMY  1957  . COLONOSCOPY W/ BIOPSIES  May 27, 2011,01/10/14   tubular adenoma in the ascending colon and descending colon. Tubulovillous adenoma in the upper rectum 12 mm. Diverticulosis.  Marland Kitchen FRACTURE SURGERY Right    femur fracture  . HERNIA REPAIR    . INCONTINENCE SURGERY  2006  . NOSE SURGERY  2000  . UPPER GI ENDOSCOPY  May 30, 2004   large hiatal hernia, duodenal  diverticulum.    SOCIAL HISTORY:   Social History  Substance Use Topics  . Smoking status: Never Smoker  . Smokeless tobacco: Never Used  . Alcohol use No    FAMILY HISTORY:   Family History  Problem Relation Age of Onset  . Arthritis Mother   . Heart disease Mother   . Heart disease Father   . Heart disease Sister     CAD  . Hypertension Sister   . Breast cancer Neg Hx     DRUG ALLERGIES:   Allergies  Allergen Reactions  . Amoxicillin Other (See Comments)    Hand tingling   . Nsaids   . Tolmetin Other (See Comments)    Cannot take because of Kidneys    REVIEW OF SYSTEMS:  REVIEW OF SYSTEMS:  CONSTITUTIONAL: Denies fevers, chills, fatigue, weakness.  EYES: Denies blurred vision, double vision, or eye pain.  EARS, NOSE, THROAT: Denies tinnitus, ear pain, hearing loss.  RESPIRATORY: denies cough, shortness of breath, wheezing  CARDIOVASCULAR: Denies chest pain, palpitations, edema.  GASTROINTESTINAL: Denies nausea, vomiting, diarrhea, abdominal pain.  GENITOURINARY: Denies dysuria, hematuria.  ENDOCRINE: Denies nocturia or thyroid problems. HEMATOLOGIC AND LYMPHATIC: Denies easy bruising or bleeding.  SKIN: Denies rash or lesions.  MUSCULOSKELETAL: Denies pain in neck, back, shoulder, knees, hips, or further arthritic symptoms.  NEUROLOGIC: Denies paralysis, paresthesias.  PSYCHIATRIC: Denies anxiety or depressive symptoms. Otherwise full review of systems performed by me is  negative.   MEDICATIONS AT HOME:   Prior to Admission medications   Medication Sig Start Date End Date Taking? Authorizing Provider  acetaminophen (RA ACETAMINOPHEN) 650 MG CR tablet Take by mouth.    Historical Provider, MD  Cholecalciferol (VITAMIN D PO) Take by mouth daily.    Historical Provider, MD  Cyanocobalamin (B-12) 2500 MCG TABS Take by mouth daily.    Historical Provider, MD  ferrous sulfate 325 (65 FE) MG tablet Take 1 mg by mouth daily.     Historical Provider, MD    HYDROCHLOROTHIAZIDE PO Take by mouth daily.    Historical Provider, MD  hydrocortisone-pramoxine (ANALPRAM HC) 2.5-1 % rectal cream Place 1 application rectally 3 (three) times daily. 01/11/15   Robert Bellow, MD  lisinopril (PRINIVIL,ZESTRIL) 10 MG tablet Take 10 mg by mouth daily.  01/15/15   Historical Provider, MD  metoprolol tartrate (LOPRESSOR) 25 MG tablet Take 12.5 mg by mouth 2 (two) times daily.    Historical Provider, MD  mometasone (NASONEX) 50 MCG/ACT nasal spray PLACE 2 SPRAYS INTO THE NOSE DAILY Patient taking differently: PLACE 2 SPRAYS INTO THE NOSE AS NEEDED. 12/20/15   Leone Haven, MD  Multiple Vitamins-Minerals (IMMUNE SYSTEM BOOSTER PO) Take by mouth. Ambatrose (Immune System) 2 tabs daily    Historical Provider, MD  multivitamin-lutein (OCUVITE-LUTEIN) CAPS capsule Take 1 capsule by mouth daily.    Historical Provider, MD  polyethylene glycol (MIRALAX / GLYCOLAX) packet Take 17 g by mouth daily.    Historical Provider, MD  potassium chloride SA (K-DUR,KLOR-CON) 20 MEQ tablet Take 20 mEq by mouth daily.    Historical Provider, MD  predniSONE (DELTASONE) 5 MG tablet Take 5 mg by mouth daily.    Historical Provider, MD  Probiotic Product (PROBIOTIC DAILY PO) Take 1 tablet by mouth daily.    Historical Provider, MD  traMADol (ULTRAM) 50 MG tablet Take 1 tablet (50 mg total) by mouth 2 (two) times daily as needed. 10/25/15   Rubbie Battiest, NP  vitamin E 400 UNIT capsule Take 400 Units by mouth daily.    Historical Provider, MD      VITAL SIGNS:  Blood pressure 138/82, pulse 66, temperature 97.8 F (36.6 C), temperature source Oral, resp. rate 18, height 5\' 2"  (1.575 m), weight 128 lb (58.1 kg), SpO2 97 %.  PHYSICAL EXAMINATION:  VITAL SIGNS: Vitals:   03/05/16 1415  BP: 138/82  Pulse: 66  Resp: 18  Temp: 97.8 F (36.6 C)   GENERAL:82 y.o.female currently in no acute distress.  HEAD: Normocephalic,Small area of ecchymosis under left orbit, some blood in her  hair, repaired laceration  EYES: Pupils equal, round, reactive to light. Extraocular muscles intact. No scleral icterus.  MOUTH: Moist mucosal membrane. Dentition intact. No abscess noted.  EAR, NOSE, THROAT: Clear without exudates. No external lesions.  NECK: Supple. No thyromegaly. No nodules. No JVD.  PULMONARY: Clear to ascultation, without wheeze rails or rhonci. No use of accessory muscles, Good respiratory effort. good air entry bilaterally CHEST: Nontender to palpation.  CARDIOVASCULAR: S1 and S2. Regular rate and rhythm. No murmurs, rubs, or gallops. No edema. Pedal pulses 2+ bilaterally.  GASTROINTESTINAL: Soft, nontender, nondistended. No masses. Positive bowel sounds. No hepatosplenomegaly.  MUSCULOSKELETAL: No swelling, clubbing, or edema. Range of motion full in all extremities.  NEUROLOGIC: Cranial nerves II through XII are intact. No gross focal neurological deficits. Sensation intact. Reflexes intact.  SKIN: No ulceration, lesions, rashes, or cyanosis. Skin warm and dry. Turgor intact.  PSYCHIATRIC:  Mood, affect within normal limits. The patient is awake, alert and oriented x 3. Insight, judgment intact.    LABORATORY PANEL:   CBC  Recent Labs Lab 03/05/16 1449  WBC 8.6  HGB 13.4  HCT 38.0  PLT 180   ------------------------------------------------------------------------------------------------------------------  Chemistries   Recent Labs Lab 03/05/16 1449  NA 123*  K 3.3*  CL 86*  CO2 24  GLUCOSE 105*  BUN 21*  CREATININE 0.88  CALCIUM 9.0  AST 20  ALT 13*  ALKPHOS 89  BILITOT 1.1   ------------------------------------------------------------------------------------------------------------------  Cardiac Enzymes No results for input(s): TROPONINI in the last 168 hours. ------------------------------------------------------------------------------------------------------------------  RADIOLOGY:  Dg Ribs Bilateral W/chest  Result Date:  03/05/2016 CLINICAL DATA:  Status post fall, bilateral anterior rib pain. EXAM: BILATERAL RIBS AND CHEST - 4+ VIEW COMPARISON:  Chest x-ray of March 20th 2017 FINDINGS: The lungs are adequately inflated. There is no pneumothorax or pleural effusion. There is no focal infiltrate. The cardiac silhouette is enlarged. There is calcification in the wall of the tortuous ascending and descending thoracic aorta. The bones are diffusely osteopenic. There is an old fracture of the anterior aspect of the left seventh rib. There is a known high-grade wedge compression of T7. IMPRESSION: 1. No acute rib fracture is observed. There is an old left anterior seventh rib fracture. 2. There is no pneumothorax or pleural effusion. There are chronic bronchitic changes. 3. Cardiomegaly.  Aortic atherosclerosis. Electronically Signed   By: David  Martinique M.D.   On: 03/05/2016 10:53   Ct Head Wo Contrast  Result Date: 03/05/2016 CLINICAL DATA:  Fall, hit head and left parietal region. EXAM: CT HEAD WITHOUT CONTRAST TECHNIQUE: Contiguous axial images were obtained from the base of the skull through the vertex without intravenous contrast. COMPARISON:  None. FINDINGS: There is atrophy and chronic small vessel disease changes. No acute intracranial abnormality. Specifically, no hemorrhage, hydrocephalus, mass lesion, acute infarction, or significant intracranial injury. No acute calvarial abnormality. Postoperative changes in the paranasal sinuses. Diffuse mucosal thickening in the remaining sinuses. No air-fluid levels. Mastoid air cells are clear. IMPRESSION: No acute intracranial abnormality. Atrophy, chronic microvascular disease. Postoperative changes in the paranasal sinuses. Chronic sinusitis changes. Electronically Signed   By: Rolm Baptise M.D.   On: 03/05/2016 11:07    EKG:   Orders placed or performed during the hospital encounter of 03/05/16  . ED EKG  . ED EKG    IMPRESSION AND PLAN:   82 year old Caucasian female  history of vasculitis on chronic steroids presenting with weakness found to incidentally be hyponatremic.  1. Hyponatremia: Likely combination of inadequate oral intake, hydrochlorothiazide-gentle IV fluid hydration follow sodium levels, check TSH, check random cortisol level given she has been on chronic steroids for numerous years. If this level is abnormal we'll need to follow with either ACTH stim test or at least a.m. cortisol 2. Essential hypertension: Once again hold hydrochlorothiazide continue with Lopressor 3. Vasculitis: Continue steroids 4. Hypokalemia: Replace goal 4-5    All the records are reviewed and case discussed with ED provider. Management plans discussed with the patient, family and they are in agreement.  CODE STATUS: Full  TOTAL TIME TAKING CARE OF THIS PATIENT: 33 minutes.    Hower,  Karenann Cai.D on 03/05/2016 at 4:40 PM  Between 7am to 6pm - Pager - [phone]  After 6pm: House Pager: - [phone]  [facility]  Office  [phone]  CC: Primary care physician; Tommi Rumps, MD

## 2016-03-05 NOTE — Telephone Encounter (Signed)
Seen in the office

## 2016-03-05 NOTE — Telephone Encounter (Signed)
Sp[facility] with [facility] from [facility] about taking patient to the ED. Claiborne Billings said that she will be able to take the patient and would be there within a few minutes to take her. I called the patient back and informed her that Claiborne Billings was on the way to pick her up to take her. Called back and Samantha answered the phone and stated that they was leaving then to go to ED

## 2016-03-05 NOTE — Progress Notes (Signed)
Tommi Rumps, MD Phone: [phone]  [name] is a 82 y.o. female who presents today for same-day visit.  Patient notes she fell this morning. She was walking and drinking a Dr. Malachi Bonds at the same time. States she tripped on a rug at home. She hit the left crown of her head on a coffee table and had a laceration that had bled significantly right after the event though little bleeding at this time. No loss of consciousness. No headaches. No vision changes. She notes over the last 2-3 days she has been shaky with a tremor. Gait is described as wobbly. Notes overall feels weak though no focal weakness. No numbness. Nearly fell recently. Has issues with wound healing in the past. Notes her right leg had been hurting anteriorly in the area of prior wound and had a wound that had been difficult to heal. She had an ultrasound of her right lower extremity last week that was negative for DVT. No numbness in her legs. Patient additionally notes some soreness in her xiphoid process area that started after her fall. Feels as though it catches with deep breaths. No chest pressure or shortness of breath. Her family member states this has occurred previously. Patient is not on anticoagulation or antiplatelet therapy.  PMH: nonsmoker.   ROS see history of present illness  Objective  Physical Exam Vitals:   03/05/16 0830  BP: 116/78  Pulse: 66  Temp: 98.2 F (36.8 C)    BP Readings from Last 3 Encounters:  03/05/16 116/78  02/07/16 100/62  01/21/16 128/82   Wt Readings from Last 3 Encounters:  03/05/16 122 lb 9.6 oz (55.6 kg)  02/07/16 125 lb 4 oz (56.8 kg)  01/21/16 125 lb 1.9 oz (56.8 kg)    Physical Exam  Constitutional: No distress.  HENT:  Head: Head is without raccoon's eyes and without Battle's sign.    Mouth/Throat: Oropharynx is clear and moist. No oropharyngeal exudate.  Eyes: Conjunctivae are normal. Pupils are equal, round, and reactive to light.  Cardiovascular:  Normal rate, regular rhythm and normal heart sounds.   Pulmonary/Chest: Effort normal and breath sounds normal. She exhibits tenderness (bilateral costochondral joints mildly tender with no bruising).  Neurological: She is alert.  CN 2-12 intact, 5/5 strength in bilateral biceps, triceps, grip, quads, hamstrings, plantar and dorsiflexion, sensation to light touch intact in bilateral UE and LE, patient's gait is wobbly and she has to hold onto the table or wall to steady herself at times, some tremor at rest noted in her hands  Skin: Skin is warm and dry. She is not diaphoretic.  Hemosiderin deposition changes in bilateral lower extremities, no obvious wound over right shin     Assessment/Plan: Please see individual problem list.  Head injury Patient with fall related to gait abnormality and tripping over a rug. Reports gait has been off over the last 2-3 days. States she feels wobbly on her feet. Some tremor as well. These were present prior to her fall. She has no other neurological abnormalities on exam today. Laceration and abrasion on her scalp appears quite shallow with underlying swelling with likely hematoma formation. Does not appear that this laceration is deep enough to benefit from staples or sutures at this time. I did have another provider in the office observe the laceration and they agreed. Area was cleansed by the CMA and dressed with antibiotic ointment and gauze. Area at left lateral eyebrow shallow as well. Given changes in gait we'll obtain a CT  of her head. Patient reports having a rod in her leg that her daughter states precludes her from having MRIs. We will proceed with CT scan to evaluate for potential cause of gait disturbance and to evaluate her head injury. We will check lab work as well to evaluate for electrolyte disturbances or anemia. Patient also with some tenderness over bilateral costochondral muscles after the fall. No obvious defects on exam. We will check a chest  x-ray and bilateral rib films as well. Discussed keeping the laceration on her head covered for the next 24 hours and then can cleanse with soap and water as needed. They're given return precautions.   Orders Placed This Encounter  Procedures  . CT Head Wo Contrast    Standing Status:   Future    Number of Occurrences:   1    Standing Expiration Date:   06/05/2017    Order Specific Question:   Reason for Exam (SYMPTOM  OR DIAGNOSIS REQUIRED)    Answer:   fall with head injury this morning, has been wobbly on her feet for the past 2-3 days, otherwise neurologically intact    Order Specific Question:   Preferred imaging location?    Answer:   [facility]    Order Specific Question:   Call Results- Best Contact Number?    Answer:   [phone]  Hold patient  . DG Ribs Bilateral W/Chest    Standing Status:   Future    Number of Occurrences:   1    Standing Expiration Date:   05/05/2017    Order Specific Question:   Reason for Exam (SYMPTOM  OR DIAGNOSIS REQUIRED)    Answer:   anterior rib pain s/p fall    Order Specific Question:   Preferred imaging location?    Answer:   [facility]  . CBC  . Comp Met (CMET)    Tommi Rumps, MD [place]

## 2016-03-05 NOTE — ED Triage Notes (Signed)
Patient fell at 0930 this morning after tripping on shoes.  Patient states she has been "wobbley" for about a week now.  Seen by PCP today for fall and had a CT Head done.  Also had blood work drawn and NA 119.  Sent to ED for evaluation.

## 2016-03-05 NOTE — Telephone Encounter (Signed)
Noted  

## 2016-03-05 NOTE — Progress Notes (Signed)
Pre visit review using our clinic review tool, if applicable. No additional management support is needed unless otherwise documented below in the visit note. 

## 2016-03-05 NOTE — Telephone Encounter (Signed)
CMA called and spoke with patient initially and advised of lab results and scan results. Advised that she go to the emergency room then contacted the patient's daughter to see if she could transfer her to the ED. Patient's daughter reported that she is 45 minutes away and hadn't eaten breakfast or lunch and needed to eat but would eventually get her mom to the ED. I then spoke with the patient's daughter and advised of the risk of low sodium potentially causing a seizure and that she should get to the emergency room as quickly as possible. Advised that I'll call the patient back to see what she wanted to do. Called and spoke with the patient and she reported that she might be able to get somebody at her retirement facility to take her to the emergency room. She provided me with the number and I'll have the CMA call to see if someone at the facility can take the patient to the emergency room or we will need to call EMS transport the patient.

## 2016-03-05 NOTE — Patient Instructions (Addendum)
Nice to see you. We are going to obtain a CT scan of your head to evaluate for a cause of your gait abnormality. This will also evaluate your head injury. We'll check lab work as well today. Please go get the x-ray of your ribs as well. If you develop numbness, weakness, vision changes, headache, uncontrollable bleeding, or any new or changing symptoms please seek medical attention.

## 2016-03-05 NOTE — Assessment & Plan Note (Addendum)
Patient with fall related to gait abnormality and tripping over a rug. Reports gait has been off over the last 2-3 days. States she feels wobbly on her feet. Some tremor as well. These were present prior to her fall. She has no other neurological abnormalities on exam today. Laceration and abrasion on her scalp appears quite shallow with underlying swelling with likely hematoma formation. Does not appear that this laceration is deep enough to benefit from staples or sutures at this time. I did have another provider in the office observe the laceration and they agreed. Area was cleansed by the CMA and dressed with antibiotic ointment and gauze. Area at left lateral eyebrow shallow as well. Given changes in gait we'll obtain a CT of her head. Patient reports having a rod in her leg that her daughter states precludes her from having MRIs. We will proceed with CT scan to evaluate for potential cause of gait disturbance and to evaluate her head injury. We will check lab work as well to evaluate for electrolyte disturbances or anemia. Patient also with some tenderness over bilateral costochondral muscles after the fall. No obvious defects on exam. We will check a chest x-ray and bilateral rib films as well. Discussed keeping the laceration on her head covered for the next 24 hours and then can cleanse with soap and water as needed. They're given return precautions.

## 2016-03-06 LAB — BASIC METABOLIC PANEL
ANION GAP: 7 (ref 5–15)
BUN: 15 mg/dL (ref 6–20)
CALCIUM: 8.7 mg/dL — AB (ref 8.9–10.3)
CO2: 25 mmol/L (ref 22–32)
CREATININE: 0.62 mg/dL (ref 0.44–1.00)
Chloride: 97 mmol/L — ABNORMAL LOW (ref 101–111)
GFR calc Af Amer: >60 mL/min (ref >60)
GLUCOSE: 101 mg/dL — AB (ref 65–99)
Potassium: 3.9 mmol/L (ref 3.5–5.1)
Sodium: 129 mmol/L — ABNORMAL LOW (ref 135–145)

## 2016-03-06 MED ORDER — LACTULOSE 10 GM/15ML PO SOLN: 30.0000 g | Freq: Two times a day (BID) | ORAL | Status: DC | PRN

## 2016-03-06 MED ORDER — LACTULOSE 10 GM/15ML PO SOLN
20.0000 g | Freq: Two times a day (BID) | ORAL | Status: DC | PRN
  Filled 2016-03-06: qty 30

## 2016-03-06 MED ORDER — CIPROFLOXACIN HCL 250 MG PO TABS
250.0000 mg | ORAL_TABLET | Freq: Two times a day (BID) | ORAL | Status: DC
  Administered 2016-03-06 – 2016-03-07 (×3): 250 mg via ORAL
  Filled 2016-03-06 (×3): qty 1

## 2016-03-06 NOTE — Progress Notes (Signed)
[facility] NAME: [name]    MR#:  [id number]  DATE OF BIRTH:  03/09/1929  SUBJECTIVE:   Patient here due to symptomatic hyponatremia and weakness. Improved today and wants to go home. Sodium improved., No chest pain, nausea, vomiting.  REVIEW OF SYSTEMS:    Review of Systems  Constitutional: Negative for chills and fever.  HENT: Negative for congestion and tinnitus.   Eyes: Negative for blurred vision and double vision.  Respiratory: Negative for cough, shortness of breath and wheezing.   Cardiovascular: Negative for chest pain, orthopnea and PND.  Gastrointestinal: Negative for abdominal pain, diarrhea, nausea and vomiting.  Genitourinary: Negative for dysuria and hematuria.  Musculoskeletal: Positive for falls.  Neurological: Positive for weakness. Negative for dizziness, sensory change and focal weakness.  All other systems reviewed and are negative.   Nutrition: Regular Tolerating Diet: Yes Tolerating PT: Await Eval.   DRUG ALLERGIES:   Allergies  Allergen Reactions  . Amoxicillin Other (See Comments)    Hand tingling   . Nsaids   . Tolmetin Other (See Comments)    Cannot take because of Kidneys    VITALS:  Blood pressure 137/75, pulse 70, temperature 98.1 F (36.7 C), temperature source Oral, resp. rate 18, height 5\' 2"  (1.575 m), weight 58.1 kg (128 lb), SpO2 100 %.  PHYSICAL EXAMINATION:   Physical Exam  GENERAL:  82 y.o.-year-old patient sitting up in chair in no acute distress.  EYES: Pupils equal, round, reactive to light and accommodation. No scleral icterus. Extraocular muscles intact.  HEENT: Head atraumatic, normocephalic. Oropharynx and nasopharynx clear.  NECK:  Supple, no jugular venous distention. No thyroid enlargement, no tenderness.  LUNGS: Normal breath sounds bilaterally, no wheezing, rales, rhonchi. No use of accessory muscles of respiration.  CARDIOVASCULAR: S1, S2 normal. II/VI  SEM at LSB, No rubs, or gallops.  ABDOMEN: Soft, nontender, nondistended. Bowel sounds present. No organomegaly or mass.  EXTREMITIES: No cyanosis, clubbing or edema b/l.    NEUROLOGIC: Cranial nerves II through XII are intact. No focal Motor or sensory deficits b/l.   PSYCHIATRIC: The patient is alert and oriented x 3.  SKIN: No obvious rash, lesion, or ulcer.    LABORATORY PANEL:   CBC  Recent Labs Lab 03/05/16 1449  WBC 8.6  HGB 13.4  HCT 38.0  PLT 180   ------------------------------------------------------------------------------------------------------------------  Chemistries   Recent Labs Lab 03/05/16 1449 03/06/16 0542  NA 123* 129*  K 3.3* 3.9  CL 86* 97*  CO2 24 25  GLUCOSE 105* 101*  BUN 21* 15  CREATININE 0.88 0.62  CALCIUM 9.0 8.7*  AST 20  --   ALT 13*  --   ALKPHOS 89  --   BILITOT 1.1  --    ------------------------------------------------------------------------------------------------------------------  Cardiac Enzymes No results for input(s): TROPONINI in the last 168 hours. ------------------------------------------------------------------------------------------------------------------  RADIOLOGY:  Dg Ribs Bilateral W/chest  Result Date: 03/05/2016 CLINICAL DATA:  Status post fall, bilateral anterior rib pain. EXAM: BILATERAL RIBS AND CHEST - 4+ VIEW COMPARISON:  Chest x-ray of March 20th 2017 FINDINGS: The lungs are adequately inflated. There is no pneumothorax or pleural effusion. There is no focal infiltrate. The cardiac silhouette is enlarged. There is calcification in the wall of the tortuous ascending and descending thoracic aorta. The bones are diffusely osteopenic. There is an old fracture of the anterior aspect of the left seventh rib. There is a known high-grade wedge compression of T7. IMPRESSION: 1. No acute  rib fracture is observed. There is an old left anterior seventh rib fracture. 2. There is no pneumothorax or pleural effusion.  There are chronic bronchitic changes. 3. Cardiomegaly.  Aortic atherosclerosis. Electronically Signed   By: David  Martinique M.D.   On: 03/05/2016 10:53   Ct Head Wo Contrast  Result Date: 03/05/2016 CLINICAL DATA:  Fall, hit head and left parietal region. EXAM: CT HEAD WITHOUT CONTRAST TECHNIQUE: Contiguous axial images were obtained from the base of the skull through the vertex without intravenous contrast. COMPARISON:  None. FINDINGS: There is atrophy and chronic small vessel disease changes. No acute intracranial abnormality. Specifically, no hemorrhage, hydrocephalus, mass lesion, acute infarction, or significant intracranial injury. No acute calvarial abnormality. Postoperative changes in the paranasal sinuses. Diffuse mucosal thickening in the remaining sinuses. No air-fluid levels. Mastoid air cells are clear. IMPRESSION: No acute intracranial abnormality. Atrophy, chronic microvascular disease. Postoperative changes in the paranasal sinuses. Chronic sinusitis changes. Electronically Signed   By: Rolm Baptise M.D.   On: 03/05/2016 11:07     ASSESSMENT AND PLAN:   82 year old female with past medical history of hypertension, GERD, chronic kidney disease, history of renal cell carcinoma, previous history UTI presents to the hospital due to weakness and recent fall.  1. Symptomatic hyponatremia-patient presented with weakness and recent fall secondary to her low sodium. -Hypovolemic hypotonic in nature. Improved with IV fluids and will continue and monitor. -TSH normal.  2. Urinary tract infection-started on oral ciprofloxacin today.  3. Generalized weakness/fall-secondary to the hyponatremia and UTI. -Await physical therapy evaluation. Continue IV fluids and antibiotics with UTI.  4. Essential hypertension-continue lisinopril, metoprolol.  5. History of vasculitis-continue maintenance prednisone.    All the records are reviewed and case discussed with Care Management/Social  Workerr. Management plans discussed with the patient, family and they are in agreement.  CODE STATUS: Full  DVT Prophylaxis: Lovenox  TOTAL TIME TAKING CARE OF THIS PATIENT: 30 minutes.   POSSIBLE D/C IN 1-2 DAYS, DEPENDING ON CLINICAL CONDITION.   Henreitta Leber M.D on 03/06/2016 at 3:18 PM  Between 7am to 6pm - Pager - [phone]  After 6pm go to [email or web address] - password [email or web address] [facility]  [facility]  Office  [phone]  CC: Primary care physician; Tommi Rumps, MD

## 2016-03-06 NOTE — Care Management Note (Signed)
Case Management Note  Patient Details  Name: [name] MRN: [id number] Date of Birth: Mar 31, 1929  Subjective/Objective:       Patient admitted with Symptomatic hyponatremia.  Patient lives at home with [facility].  Patient states that at [place] she does not use medical equipment and drives her self.  Patient does have adult children however the closest lives in [place].  Patient obtains her medication from [facility].  Patient denies any issues obtaining her medications.  Patient has RW and cane in the home.  PT has assessed the patient and recommended home health PT.              Action/Plan: Patient provided home health agency preference list.  Patient states that she does not have a preference of agency.  Heads up referral made to [facility] with [place] care  Expected Discharge Date:                  Expected Discharge Plan:     In-House Referral:     Discharge planning Services     Post Acute Care Choice:    Choice offered to:     DME Arranged:    DME Agency:     HH Arranged:    HH Agency:     Status of Service:     If discussed at [employer], dates discussed:    Additional Comments:  Beverly Sessions, RN 03/06/2016, 3:43 PM

## 2016-03-06 NOTE — Evaluation (Signed)
Physical Therapy Evaluation Patient Details Name: MONSSERRAT KINZLER MRN: [id number] DOB: Jul 22, 1929 Today's Date: 03/06/2016   History of Present Illness  presented to ER secondary to weakness and fall (with head lac, stapled in ER); admitted with acute hyponatremia.  Clinical Impression  Upon evaluation, patient alert and oriented to basic information; mild STM deficits noted.  Bilat UE/LE strength and ROM grossly symmetrical and WFL, except R ankle DF lacking full strength (baseline per daughter, related to previous hip injury).  Able to complete sit/stand, basic transfers and gait (100') without assist device, min assist; generally unsteady with 2-3 LOB requiring therapist assist for correction.  Improved to cga/close sup with use of RW and education regarding increased BOS.  Do recommend continued use of RW with all mobility at this time; patient/daughter voiced understanding and agreement, report having RW in home for use as needed. Would benefit from skilled PT to address above deficits and promote optimal return to PLOF; Recommend transition to [place] upon discharge from acute hospitalization.     Follow Up Recommendations Home health PT    Equipment Recommendations       Recommendations for Other Services       Precautions / Restrictions Precautions Precautions: Fall Restrictions Weight Bearing Restrictions: No      Mobility  Bed Mobility               General bed mobility comments: seated in recliner beginning/end of treatment session  Transfers Overall transfer level: Needs assistance Equipment used: Rolling walker (2 wheeled) Transfers: Sit to/from Stand Sit to Stand: Supervision            Ambulation/Gait Ambulation/Gait assistance: Min assist Ambulation Distance (Feet): 100 Feet Assistive device: None   Gait velocity: 10' walk time, 8 seconds   General Gait Details: very narrow, near-tandem, BOS with decreased activation/stability of R  posterior/lateral hip musculature, mod circumduction of L LE in swing.  Frequently reaching/grabbing for walls for external support; LOB x2 requiring therapist assist for correction.  Stairs            Wheelchair Mobility    Modified Rankin (Stroke Patients Only)       Balance Overall balance assessment: Needs assistance Sitting-balance support: No upper extremity supported;Feet supported Sitting balance-Leahy Scale: Good     Standing balance support: No upper extremity supported Standing balance-Leahy Scale: Poor                               Pertinent Vitals/Pain Pain Assessment: No/denies pain    Home Living Family/patient expects to be discharged to:: Private residence Living Arrangements: Alone   Type of Home: Independent living facility Home Access: Level entry     Home Layout: One level Home Equipment: Walker - 2 wheels;Cane - quad      Prior Function Level of Independence: Independent         Comments: Indep with ADLs and household mobility; ambulates to/from dining hall for 1 meal/day.  Denies fall history outside of this episode.     Hand Dominance        Extremity/Trunk Assessment   Upper Extremity Assessment: Overall WFL for tasks assessed           Lower Extremity Assessment: Overall WFL for tasks assessed (R ankle DF 3-/5 (history of R hip fracture))         Communication   Communication: No difficulties  Cognition Arousal/Alertness: Awake/alert Behavior During Therapy: WFL for  tasks assessed/performed Overall Cognitive Status: Within Functional Limits for tasks assessed       Memory: Decreased short-term memory              General Comments      Exercises Other Exercises Other Exercises: 200' with RW, cga/close sup--min cuing for increased BOS; improved safety/stability with use of RW.  Patient voices agreement; recommend continued use upon discharge Other Exercises: Toilet transfer, ambulatory with  RW, cga/close sup; sit/stand from standard toilet height, close sup.      Assessment/Plan    PT Assessment Patient needs continued PT services  PT Diagnosis Difficulty walking;Generalized weakness   PT Problem List Decreased strength;Decreased range of motion;Decreased activity tolerance;Decreased balance;Decreased mobility;Decreased knowledge of use of DME;Decreased safety awareness;Decreased knowledge of precautions;Cardiopulmonary status limiting activity  PT Treatment Interventions DME instruction;Gait training;Stair training;Functional mobility training;Therapeutic activities;Therapeutic exercise;Balance training;Patient/family education   PT Goals (Current goals can be found in the Care Plan section) Acute Rehab PT Goals Patient Stated Goal: to return home PT Goal Formulation: With patient/family Time For Goal Achievement: 03/20/16 Potential to Achieve Goals: Good    Frequency Min 2X/week   Barriers to discharge Decreased caregiver support      Co-evaluation               End of Session Equipment Utilized During Treatment: Gait belt Activity Tolerance: Patient tolerated treatment well Patient left: in chair;with call bell/phone within reach;with chair alarm set;with family/visitor present Nurse Communication: Mobility status         Time: [id number] PT Time Calculation (min) (ACUTE ONLY): 25 min   Charges:   PT Evaluation $PT Eval Low Complexity: 1 Procedure PT Treatments $Gait Training: 8-22 mins   PT G Codes:        \Antaeus Karel H. Owens Shark, PT, DPT, NCS 03/06/16, 3:25 PM [phone]

## 2016-03-07 LAB — BASIC METABOLIC PANEL
Anion gap: 5 (ref 5–15)
BUN: 11 mg/dL (ref 6–20)
CHLORIDE: 102 mmol/L (ref 101–111)
CO2: 26 mmol/L (ref 22–32)
Calcium: 8.5 mg/dL — ABNORMAL LOW (ref 8.9–10.3)
Creatinine, Ser: 0.86 mg/dL (ref 0.44–1.00)
GFR calc Af Amer: >60 mL/min (ref >60)
GFR calc non Af Amer: >60 mL/min (ref >60)
Glucose, Bld: 99 mg/dL (ref 65–99)
POTASSIUM: 3.7 mmol/L (ref 3.5–5.1)
Sodium: 133 mmol/L — ABNORMAL LOW (ref 135–145)

## 2016-03-07 MED ORDER — CIPROFLOXACIN HCL 250 MG PO TABS: 250.0000 mg | ORAL_TABLET | Freq: Two times a day (BID) | ORAL | 0 refills | Status: DC

## 2016-03-07 NOTE — Progress Notes (Signed)
Pt stable. IV removed. D/c instructions given and education provided. Signed prescriptions verified and given. Pt states she understands instructions. Pt dressed and escorted out by staff. Driven home by family.  

## 2016-03-07 NOTE — Discharge Summary (Addendum)
[place] [facility] [place] NAME: [name]    MR#:  [id number]  DATE OF BIRTH:  October 07, 1928  DATE OF ADMISSION:  03/05/2016 ADMITTING PHYSICIAN: Lytle Butte, MD  DATE OF DISCHARGE: 03/07/2016  PRIMARY CARE PHYSICIAN: Tommi Rumps, MD    ADMISSION DIAGNOSIS:  Dizziness [R42] Hyponatremia [E87.1] Scalp laceration, initial encounter [S01.01XA]  DISCHARGE DIAGNOSIS:  Active Problems:   Hyponatremia   SECONDARY DIAGNOSIS:   Past Medical History:  Diagnosis Date  . Arthritis   . Chronic kidney disease   . Colon polyp   . GERD (gastroesophageal reflux disease)   . Hemorrhoids   . Hypertension   . Renal cell carcinoma ([place]) 2003   s/p sgy for removal right kidney  . Thyroid disease 1957   hyperthyroid, had radioactiveiodine treatment   . UTI (lower urinary tract infection)   . Vasculitis ([place])    ANCA positive, pulmonary hemorrhage    HOSPITAL COURSE:   82 year old female with past medical history of hypertension, GERD, chronic kidney disease, history of renal cell carcinoma, previous history UTI presents to the hospital due to weakness and recent fall.  1. Symptomatic hyponatremia; She presented with weakness and recent fall secondary to her low sodium. Hyponatremia was due to hypovolemia as well as HCTZ use. Has improved with IV fluids and discontinuation of HCTZ.  2. Urinary tract infection-patient will be treated on oral ciprofloxacin. 3. Generalized weakness/fall-secondary to the hyponatremia and UTI. Physical therapy recommended home with home health.  4. Essential hypertension; Continue lisinopril, metoprolol.  5. History of vasculitis; she will continue maintenance prednisone   DISCHARGE CONDITIONS AND DIET:   Stable for discharge on regular diet  CONSULTS OBTAINED:  Treatment Team:  Lytle Butte, MD  DRUG ALLERGIES:   Allergies  Allergen Reactions  . Amoxicillin Other (See Comments)    Hand  tingling   . Nsaids   . Tolmetin Other (See Comments)    Cannot take because of Kidneys    DISCHARGE MEDICATIONS:   Current Discharge Medication List    START taking these medications   Details  ciprofloxacin (CIPRO) 250 MG tablet Take 1 tablet (250 mg total) by mouth 2 (two) times daily. Qty: 10 tablet, Refills: 0      CONTINUE these medications which have NOT CHANGED   Details  acetaminophen (RA ACETAMINOPHEN) 650 MG CR tablet Take 650 mg by mouth every 8 (eight) hours as needed.     cholecalciferol (VITAMIN D) 400 units TABS tablet Take 400 Units by mouth daily.    Cyanocobalamin (B-12) 2500 MCG TABS Take 2,500 mcg by mouth daily.     ferrous sulfate 325 (65 FE) MG tablet Take 1 mg by mouth daily.     lisinopril (PRINIVIL,ZESTRIL) 10 MG tablet Take 10 mg by mouth daily.     metoprolol tartrate (LOPRESSOR) 25 MG tablet Take 12.5 mg by mouth 2 (two) times daily.    multivitamin-lutein (OCUVITE-LUTEIN) CAPS capsule Take 1 capsule by mouth daily.    polyethylene glycol (MIRALAX / GLYCOLAX) packet Take 17 g by mouth daily as needed.     potassium chloride SA (K-DUR,KLOR-CON) 20 MEQ tablet Take 20 mEq by mouth daily.    predniSONE (DELTASONE) 5 MG tablet Take 5 mg by mouth daily.    Probiotic Product (PROBIOTIC DAILY PO) Take 1 tablet by mouth daily.    vitamin E 400 UNIT capsule Take 400 Units by mouth daily.      STOP taking these medications  hydrochlorothiazide (MICROZIDE) 12.5 MG capsule      hydrocortisone-pramoxine (ANALPRAM HC) 2.5-1 % rectal cream      mometasone (NASONEX) 50 MCG/ACT nasal spray               Today   CHIEF COMPLAINT:  Patient is doing well this morning. Denies any weakness. She is ready for discharge.   VITAL SIGNS:  Blood pressure 135/71, pulse 65, temperature 97.9 F (36.6 C), temperature source Oral, resp. rate 19, height 5\' 2"  (1.575 m), weight 58.1 kg (128 lb), SpO2 97 %.   REVIEW OF SYSTEMS:  Review of Systems   Constitutional: Negative.  Negative for chills, fever and malaise/fatigue.  HENT: Negative.  Negative for ear discharge, ear pain, hearing loss, nosebleeds and sore throat.   Eyes: Negative.  Negative for blurred vision and pain.  Respiratory: Negative.  Negative for cough, hemoptysis, shortness of breath and wheezing.   Cardiovascular: Negative.  Negative for chest pain, palpitations and leg swelling.  Gastrointestinal: Negative.  Negative for abdominal pain, blood in stool, diarrhea, nausea and vomiting.  Genitourinary: Negative.  Negative for dysuria.  Musculoskeletal: Negative.  Negative for back pain.  Skin: Negative.   Neurological: Negative for dizziness, tremors, speech change, focal weakness, seizures and headaches.  Endo/Heme/Allergies: Negative.  Does not bruise/bleed easily.  Psychiatric/Behavioral: Negative.  Negative for depression, hallucinations and suicidal ideas.     PHYSICAL EXAMINATION:  GENERAL:  82 y.o.-year-old patient lying in the bed with no acute distress.  NECK:  Supple, no jugular venous distention. No thyroid enlargement, no tenderness.  LUNGS: Normal breath sounds bilaterally, no wheezing, rales,rhonchi  No use of accessory muscles of respiration.  CARDIOVASCULAR: S1, S2 normal. No murmurs, rubs, or gallops.  ABDOMEN: Soft, non-tender, non-distended. Bowel sounds present. No organomegaly or mass.  EXTREMITIES: No pedal edema, cyanosis, or clubbing.  PSYCHIATRIC: The patient is alert and oriented x 3.  SKIN: No obvious rash, lesion, or ulcer.   DATA REVIEW:   CBC  Recent Labs Lab 03/05/16 1449  WBC 8.6  HGB 13.4  HCT 38.0  PLT 180    Chemistries   Recent Labs Lab 03/05/16 1449  03/07/16 0539  NA 123*  < > 133*  K 3.3*  < > 3.7  CL 86*  < > 102  CO2 24  < > 26  GLUCOSE 105*  < > 99  BUN 21*  < > 11  CREATININE 0.88  < > 0.86  CALCIUM 9.0  < > 8.5*  AST 20  --   --   ALT 13*  --   --   ALKPHOS 89  --   --   BILITOT 1.1  --   --   <  > = values in this interval not displayed.  Cardiac Enzymes No results for input(s): TROPONINI in the last 168 hours.  Microbiology Results  @[email or web address] @  RADIOLOGY:  No results found.    Management plans discussed with the patient and She is in agreement. Stable for discharge home with home health care  Patient should follow up with PCP 1 week  CODE STATUS:     Code Status Orders        Start     Ordered   03/05/16 1611  Full code  Continuous     03/05/16 1610    Code Status History    Date Active Date Inactive Code Status Order ID Comments User Context   This patient has a current code status but no historical  code status.    Advance Directive Documentation   Flowsheet Row Most Recent Value  Type of Advance Directive  Healthcare Power of Attorney, Living will  Pre-existing out of facility DNR order (yellow form or pink MOST form)  No data  "MOST" Form in Place?  No data      TOTAL TIME TAKING CARE OF THIS PATIENT: 35 minutes.    Note: This dictation was prepared with Dragon dictation along with smaller phrase technology. Any transcriptional errors that result from this process are unintentional.  Cassandr Cederberg M.D on 03/07/2016 at 12:59 PM  Between 7am to 6pm - Pager - [phone] After 6pm go to [email or web address] - password [email or web address] [facility]  Office  [phone]  CC: Primary care physician; Tommi Rumps, MD

## 2016-03-07 NOTE — Care Management (Signed)
Patient to discharge today.  Home health orders have been placed.  I have notified Corene Cornea with [facility].  Patient already has equipment at home. RNCM signing off

## 2016-03-12 ENCOUNTER — Ambulatory Visit (INDEPENDENT_AMBULATORY_CARE_PROVIDER_SITE_OTHER): Payer: Medicare Other | Admitting: Family Medicine

## 2016-03-12 ENCOUNTER — Encounter: Payer: Self-pay | Admitting: Family Medicine

## 2016-03-12 VITALS — BP 112/64 | HR 91 | Temp 98.3°F | Resp 18 | Wt 119.1 lb

## 2016-03-12 DIAGNOSIS — N390 Urinary tract infection, site not specified: Secondary | ICD-10-CM | POA: Insufficient documentation

## 2016-03-12 DIAGNOSIS — S0990XA Unspecified injury of head, initial encounter: Secondary | ICD-10-CM | POA: Diagnosis not present

## 2016-03-12 DIAGNOSIS — E871 Hypo-osmolality and hyponatremia: Secondary | ICD-10-CM | POA: Diagnosis not present

## 2016-03-12 LAB — BASIC METABOLIC PANEL
BUN: 17 mg/dL (ref 6–23)
CHLORIDE: 94 meq/L — AB (ref 96–112)
CO2: 28 mEq/L (ref 19–32)
CREATININE: 1.15 mg/dL (ref 0.40–1.20)
Calcium: 9.2 mg/dL (ref 8.4–10.5)
GFR: 47.67 mL/min — AB (ref >60.00)
Glucose, Bld: 180 mg/dL — ABNORMAL HIGH (ref 70–99)
Potassium: 4.1 mEq/L (ref 3.5–5.1)
Sodium: 131 mEq/L — ABNORMAL LOW (ref 135–145)

## 2016-03-12 NOTE — Patient Instructions (Signed)
Nice to see you. I'm glad you're doing better. We will recheck your sodium today. Please continue physical therapy at home. Please monitor the area that staples were removed from and if you develop redness or drainage or fevers please seek medical attention. If you develop any imbalance, wooziness, nausea, vomiting, or falls please seek medical attention.

## 2016-03-12 NOTE — Assessment & Plan Note (Signed)
Doing well status post head injury. Had 3 staples inserted into laceration on her scalp while in the emergency room 7 days ago. This appears well-healed with no signs of infection. The 3 staples were removed. She was advised on soap and water to keep this clean and to avoid getting her hair cut for the next week. Given return precautions.

## 2016-03-12 NOTE — Assessment & Plan Note (Signed)
Symptomatically improved. Discussed staying off HCTZ. We'll recheck a sodium today and she was given return precautions.

## 2016-03-12 NOTE — Progress Notes (Signed)
Tommi Rumps, MD Phone: [phone]  [name] is a 82 y.o. female who presents today for follow-up.  Hyponatremia: Patient was hospitalized for hyponatremia after falling and being found to have a sodium of 119. She had her hydrochlorothiazide stopped and received IV fluids and her sodium improved. Has been doing well. This morning she noted she felt a little woozy though she feels fine now. Has not fallen since discharge. Not checking her blood pressure. No lightheadedness. Is doing home physical therapy.  Reports she was treated for a urine infection while in the hospital and sent home with ciprofloxacin. She denies any symptoms. She's unsure if she actually took it when she got home as there is some confusion of whether or not they sent her home with medication for this and a prescription in. She has no urinary tract symptoms or abdominal discomfort.  Scalp laceration: Patient noted to have a scalp laceration at last visit and had staples placed in the ED. Notes no headaches, numbness, weakness, vision changes. No drainage. No erythema. No pain. Notes there are 3 staples and she is due to have them out today. It has been 7 days since they've been placed.  PMH: nonsmoker.   ROS see history of present illness  Objective  Physical Exam Vitals:   03/12/16 1325  BP: 112/64  Pulse: 91  Resp: 18  Temp: 98.3 F (36.8 C)    BP Readings from Last 3 Encounters:  03/12/16 112/64  03/07/16 135/71  03/06/16 (!) 145/72   Wt Readings from Last 3 Encounters:  03/12/16 119 lb 2 oz (54 kg)  03/05/16 128 lb (58.1 kg)  03/05/16 122 lb 9.6 oz (55.6 kg)    Physical Exam  Constitutional: No distress.  HENT:  Mouth/Throat: Oropharynx is clear and moist. No oropharyngeal exudate.  Well healing left posterior scalp laceration left 2 cm in length, 3 staples identified after area was cleaned with sterile saline by CMA, staples were removed and 3 were counted, wound appears to be well  approximated and well-healing with no dehiscence  Eyes: Conjunctivae are normal. Pupils are equal, round, and reactive to light.  Cardiovascular: Normal rate, regular rhythm and normal heart sounds.   Pulmonary/Chest: Effort normal and breath sounds normal.  Neurological: She is alert.  CN 2-12 intact, 5/5 strength in bilateral biceps, triceps, grip, quads, hamstrings, plantar and dorsiflexion, sensation to light touch intact in bilateral UE and LE, 2+ patellar reflexes  Skin: Skin is warm and dry. She is not diaphoretic.     Assessment/Plan: Please see individual problem list.  Hyponatremia Symptomatically improved. Discussed staying off HCTZ. We'll recheck a sodium today and she was given return precautions.  Head injury Doing well status post head injury. Had 3 staples inserted into laceration on her scalp while in the emergency room 7 days ago. This appears well-healed with no signs of infection. The 3 staples were removed. She was advised on soap and water to keep this clean and to avoid getting her hair cut for the next week. Given return precautions.  UTI (urinary tract infection) Patient reports having been treated with ciprofloxacin for UTI. There is some confusion about whether or not she was sent home with this medication or if it was sent to her pharmacy. She reports no symptoms. On review of the UA seems as though leukocytes were the only thing positive. No urine culture was sent. We will obtain a urine culture to evaluate for infection prior to repeating treatment.   Orders  Placed This Encounter  Procedures  . Urine Culture  . Basic Metabolic Panel (BMET)    Tommi Rumps, MD [place]

## 2016-03-12 NOTE — Assessment & Plan Note (Signed)
Patient reports having been treated with ciprofloxacin for UTI. There is some confusion about whether or not she was sent home with this medication or if it was sent to her pharmacy. She reports no symptoms. On review of the UA seems as though leukocytes were the only thing positive. No urine culture was sent. We will obtain a urine culture to evaluate for infection prior to repeating treatment.

## 2016-03-12 NOTE — Progress Notes (Signed)
Pre visit review using our clinic review tool, if applicable. No additional management support is needed unless otherwise documented below in the visit note. 

## 2016-03-14 LAB — URINE CULTURE: ORGANISM ID, BACTERIA: NO GROWTH

## 2016-03-17 ENCOUNTER — Other Ambulatory Visit: Payer: Self-pay | Admitting: Family Medicine

## 2016-03-17 DIAGNOSIS — E871 Hypo-osmolality and hyponatremia: Secondary | ICD-10-CM

## 2016-03-19 ENCOUNTER — Telehealth: Payer: Self-pay | Admitting: Family Medicine

## 2016-03-19 NOTE — Telephone Encounter (Signed)
When can patient start driving again?

## 2016-03-19 NOTE — Telephone Encounter (Signed)
She can return to driving when she is not having any imbalance or dizziness and we have rechecked her sodium again if it has remained stable or normalized.

## 2016-03-19 NOTE — Telephone Encounter (Signed)
[name] called asking when / if she can drive. She'd like a phone call regarding this.  Pt's ph# [phone] Thank you.

## 2016-03-20 NOTE — Telephone Encounter (Signed)
Notified patient of message. Patient comes in tomorrow to recheck labs. 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

## 2016-03-21 ENCOUNTER — Other Ambulatory Visit (INDEPENDENT_AMBULATORY_CARE_PROVIDER_SITE_OTHER): Payer: Medicare Other

## 2016-03-21 ENCOUNTER — Encounter (INDEPENDENT_AMBULATORY_CARE_PROVIDER_SITE_OTHER): Payer: Self-pay

## 2016-03-21 DIAGNOSIS — E871 Hypo-osmolality and hyponatremia: Secondary | ICD-10-CM

## 2016-03-21 LAB — BASIC METABOLIC PANEL
BUN: 14 mg/dL (ref 6–23)
CHLORIDE: 99 meq/L (ref 96–112)
CO2: 31 mEq/L (ref 19–32)
Calcium: 9.3 mg/dL (ref 8.4–10.5)
Creatinine, Ser: 1.1 mg/dL (ref 0.40–1.20)
GFR: 50.17 mL/min — ABNORMAL LOW (ref >60.00)
GLUCOSE: 90 mg/dL (ref 70–99)
POTASSIUM: 4 meq/L (ref 3.5–5.1)
SODIUM: 136 meq/L (ref 135–145)

## 2016-03-21 NOTE — Telephone Encounter (Signed)
Noted  

## 2016-03-25 ENCOUNTER — Telehealth: Payer: Self-pay | Admitting: Family Medicine

## 2016-03-25 NOTE — Telephone Encounter (Signed)
Please advise 

## 2016-03-25 NOTE — Telephone Encounter (Signed)
Appears like 20 mEq according to the chart.

## 2016-03-25 NOTE — Telephone Encounter (Signed)
[name] [phone] called from [place] regarding wanting to know if pt should be on 20 meq or 10 meq? Verbal is ok?  Thank you!

## 2016-03-26 NOTE — Telephone Encounter (Signed)
Left a detailed message for [facility]. thanks

## 2016-03-31 ENCOUNTER — Telehealth: Payer: Self-pay | Admitting: Family Medicine

## 2016-03-31 NOTE — Telephone Encounter (Signed)
Pt daughter called wanting to get pt sodium level results from 08/4, 08/09 and 08/18.   Cal daughter Marcie Bal @ [phone]. Thank you!

## 2016-03-31 NOTE — Telephone Encounter (Signed)
Spoke with the daughter, reviewed labs, thanks

## 2016-04-10 ENCOUNTER — Telehealth: Payer: Self-pay | Admitting: Family Medicine

## 2016-04-10 NOTE — Telephone Encounter (Signed)
Pt says she called office yesterday. She fell yesterday and she thinks she broke her left arm. She says she has not heard from any one. Tried looking in her chart and can not see a message. Pt wants to know what she should do. Please advise, [phone].

## 2016-04-10 NOTE — Telephone Encounter (Signed)
I agree patient should be evaluated today.

## 2016-04-10 NOTE — Telephone Encounter (Signed)
Spoke with patient and advise her to go to a [place] clinic to be seen. Patient was upset because of not getting a call back yesterday about this. I explained that we did not get a message about this.

## 2016-04-10 NOTE — Telephone Encounter (Signed)
Please advise, Urgent care? Or ED

## 2016-04-11 NOTE — Telephone Encounter (Signed)
Mailed [employer] gift card [phone]) as apology and note thanking for feedback so we can improve processes.

## 2016-05-16 ENCOUNTER — Ambulatory Visit (INDEPENDENT_AMBULATORY_CARE_PROVIDER_SITE_OTHER): Payer: Medicare Other | Admitting: Family Medicine

## 2016-05-16 ENCOUNTER — Encounter: Payer: Self-pay | Admitting: Family Medicine

## 2016-05-16 VITALS — BP 136/84 | HR 62 | Temp 98.4°F | Wt 118.0 lb

## 2016-05-16 DIAGNOSIS — S0990XA Unspecified injury of head, initial encounter: Secondary | ICD-10-CM | POA: Diagnosis not present

## 2016-05-16 DIAGNOSIS — E871 Hypo-osmolality and hyponatremia: Secondary | ICD-10-CM | POA: Diagnosis not present

## 2016-05-16 DIAGNOSIS — N189 Chronic kidney disease, unspecified: Secondary | ICD-10-CM | POA: Diagnosis not present

## 2016-05-16 LAB — COMPREHENSIVE METABOLIC PANEL
ALT: 13 U/L (ref 0–35)
AST: 17 U/L (ref 0–37)
Albumin: 3.7 g/dL (ref 3.5–5.2)
Alkaline Phosphatase: 87 U/L (ref 39–117)
BUN: 19 mg/dL (ref 6–23)
CALCIUM: 8.9 mg/dL (ref 8.4–10.5)
CHLORIDE: 107 meq/L (ref 96–112)
CO2: 29 meq/L (ref 19–32)
CREATININE: 0.95 mg/dL (ref 0.40–1.20)
GFR: 59.4 mL/min — ABNORMAL LOW (ref >60.00)
GLUCOSE: 77 mg/dL (ref 70–99)
Potassium: 3.7 mEq/L (ref 3.5–5.1)
SODIUM: 143 meq/L (ref 135–145)
Total Bilirubin: 0.5 mg/dL (ref 0.2–1.2)
Total Protein: 5.8 g/dL — ABNORMAL LOW (ref 6.0–8.3)

## 2016-05-16 NOTE — Assessment & Plan Note (Signed)
Well healed. Small 2-3 mm diameter scab noted. Advised to continue to monitor this. She can return to getting her hair done. Given return precautions.

## 2016-05-16 NOTE — Progress Notes (Signed)
Pre visit review using our clinic review tool, if applicable. No additional management support is needed unless otherwise documented below in the visit note. 

## 2016-05-16 NOTE — Progress Notes (Signed)
  Tommi Rumps, MD Phone: [phone]  [name] is a 82 y.o. female who presents today for follow-up.  Hyponatremia: Patient has not been taking HCTZ. She's not had any wobbliness. No nausea, vomiting, or confusion. She wants her sodium rechecked.  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 Is followed by [facility]. Currently taking lisinopril. Does have some swelling in her bilateral feet. No orthopnea or shortness of breath. Does not check her blood pressure.  Head laceration: Patient notes this appears to be well-healing. She wants to go ahead and get her hair done. She's not had any drainage or pain at this site  PMH: nonsmoker.   ROS see history of present illness  Objective  Physical Exam Vitals:   05/16/16 0827  BP: 136/84  Pulse: 62  Temp: 98.4 F (36.9 C)    BP Readings from Last 3 Encounters:  05/16/16 136/84  03/12/16 112/64  03/07/16 135/71   Wt Readings from Last 3 Encounters:  05/16/16 118 lb (53.5 kg)  03/12/16 119 lb 2 oz (54 kg)  03/05/16 128 lb (58.1 kg)    Physical Exam  Constitutional: No distress.  Cardiovascular: Normal rate, regular rhythm and normal heart sounds.   Pulmonary/Chest: Effort normal and breath sounds normal.  Musculoskeletal: She exhibits edema (1+ edema bilateral feet).  Neurological: She is alert. Gait normal.  Skin: Skin is warm and dry. She is not diaphoretic.        Assessment/Plan: Please see individual problem list.  Chronic kidney disease Followed by [facility]. Has ANCA vasculitis that appears to be under good control at this time. She'll continue lisinopril. We will check kidney function and potassium today as she is on potassium supplements. If normal potassium could consider discontinuing supplementation and rechecking next week.  Hyponatremia Symptomatic. We'll recheck a sodium today. No longer on HCTZ.  Head injury Well healed. Small 2-3 mm diameter scab  noted. Advised to continue to monitor this. She can return to getting her hair done. Given return precautions.   Orders Placed This Encounter  Procedures  . Comp Met (CMET)    Tommi Rumps, MD [place]

## 2016-05-16 NOTE — Patient Instructions (Signed)
Nice to see you. We are going to recheck some lab work today and call you with the results. You can go ahead and get a perm, though you need to monitor the area on your head to ensure that there is no swelling, pain, or redness. Please keep your legs elevated as much as possible. Please consider wearing your compression stockings.

## 2016-05-16 NOTE — Assessment & Plan Note (Signed)
Followed by [facility]. Has ANCA vasculitis that appears to be under good control at this time. She'll continue lisinopril. We will check kidney function and potassium today as she is on potassium supplements. If normal potassium could consider discontinuing supplementation and rechecking next week.

## 2016-05-16 NOTE — Assessment & Plan Note (Signed)
Symptomatic. We'll recheck a sodium today. No longer on HCTZ.

## 2016-06-02 ENCOUNTER — Ambulatory Visit: Payer: BLUE CROSS/BLUE SHIELD

## 2016-07-08 ENCOUNTER — Emergency Department
Admission: EM | Admit: 2016-07-08 | Discharge: 2016-07-08 | Disposition: A | Discharge status: Home / Self Care | Payer: Medicare Other | Attending: Emergency Medicine | Admitting: Emergency Medicine

## 2016-07-08 ENCOUNTER — Emergency Department: Payer: Medicare Other

## 2016-07-08 ENCOUNTER — Encounter: Payer: Self-pay | Admitting: Emergency Medicine

## 2016-07-08 DIAGNOSIS — I129 Hypertensive chronic kidney disease with stage 1 through stage 4 chronic kidney disease, or unspecified chronic kidney disease: Secondary | ICD-10-CM | POA: Insufficient documentation

## 2016-07-08 DIAGNOSIS — S61214A Laceration without foreign body of right ring finger without damage to nail, initial encounter: Secondary | ICD-10-CM | POA: Diagnosis not present

## 2016-07-08 DIAGNOSIS — W010XXA Fall on same level from slipping, tripping and stumbling without subsequent striking against object, initial encounter: Secondary | ICD-10-CM | POA: Diagnosis not present

## 2016-07-08 DIAGNOSIS — S52501A Unspecified fracture of the lower end of right radius, initial encounter for closed fracture: Secondary | ICD-10-CM | POA: Insufficient documentation

## 2016-07-08 DIAGNOSIS — S52611A Displaced fracture of right ulna styloid process, initial encounter for closed fracture: Secondary | ICD-10-CM | POA: Diagnosis not present

## 2016-07-08 DIAGNOSIS — N189 Chronic kidney disease, unspecified: Secondary | ICD-10-CM | POA: Insufficient documentation

## 2016-07-08 DIAGNOSIS — Y999 Unspecified external cause status: Secondary | ICD-10-CM | POA: Insufficient documentation

## 2016-07-08 DIAGNOSIS — Z79899 Other long term (current) drug therapy: Secondary | ICD-10-CM | POA: Diagnosis not present

## 2016-07-08 DIAGNOSIS — Y9389 Activity, other specified: Secondary | ICD-10-CM | POA: Insufficient documentation

## 2016-07-08 DIAGNOSIS — S59901A Unspecified injury of right elbow, initial encounter: Secondary | ICD-10-CM | POA: Diagnosis present

## 2016-07-08 DIAGNOSIS — Z85528 Personal history of other malignant neoplasm of kidney: Secondary | ICD-10-CM | POA: Diagnosis not present

## 2016-07-08 DIAGNOSIS — Y92009 Unspecified place in unspecified non-institutional (private) residence as the place of occurrence of the external cause: Secondary | ICD-10-CM | POA: Diagnosis not present

## 2016-07-08 DIAGNOSIS — S61411A Laceration without foreign body of right hand, initial encounter: Secondary | ICD-10-CM

## 2016-07-08 DIAGNOSIS — S62101A Fracture of unspecified carpal bone, right wrist, initial encounter for closed fracture: Secondary | ICD-10-CM

## 2016-07-08 MED ORDER — TRAMADOL HCL 50 MG PO TABS: 50.0000 mg | ORAL_TABLET | Freq: Two times a day (BID) | ORAL | 0 refills | Status: AC

## 2016-07-08 NOTE — Discharge Instructions (Signed)
You have been treated for a fracture to your wrist. You have been placed in custom, temporary wrist splint. You should apply ice to the wrist, over the splint. You will follow-up with Dr. Marry Guan for further fracture care. Keep the wound, which has been repaired with adhesive, clean and dry. Avoid using lotions, oils, ointments, or moisturizers over the wound adhesive.

## 2016-07-08 NOTE — ED Provider Notes (Signed)
[facility] Emergency Department Provider Note ____________________________________________  Time seen: 1600  I have reviewed the triage vital signs and the nursing notes.  HISTORY  Chief Complaint  Fall  HPI [name] is a 82 y.o. female presents to the ED from home, for evaluation of injuries sustained following a fall. Patient describes a mechanical fall she was going to the bathroom. She apparently tripped and fell with an outstretched right hand.She sustained a laceration or skin tear to the right ring finger and complains of pain to the right wrist. She denies any head injury, and nausea, or syncope.  Past Medical History:  Diagnosis Date  . Arthritis   . Chronic kidney disease   . Colon polyp   . GERD (gastroesophageal reflux disease)   . Hemorrhoids   . Hypertension   . Renal cell carcinoma ([place]) 2003   s/p sgy for removal right kidney  . Thyroid disease 1957   hyperthyroid, had radioactiveiodine treatment   . UTI (lower urinary tract infection)   . Vasculitis ([place])    ANCA positive, pulmonary hemorrhage    Patient Active Problem List   Diagnosis Date Noted  . UTI (urinary tract infection) 03/12/2016  . Head injury 03/05/2016  . Hyponatremia 03/05/2016  . Allergic rhinitis 12/20/2015  . Strain of left pectoralis muscle 10/28/2015  . CN (constipation) 03/01/2015  . Bleeding hemorrhoids 01/12/2015  . Wound, open, arm, forearm 12/29/2014  . Open wound, lower leg 12/22/2014  . Viral URI 12/22/2014  . History of kidney cancer 09/17/2014  . Palpitations 09/17/2014  . Rectal bleeding 01/03/2014  . Personal history of colonic polyps 01/03/2014  . Medicare annual wellness visit, subsequent 09/01/2013  . Absent pedal pulses 09/01/2013  . Screening for breast cancer 09/01/2013  . Encounter to establish care 08/09/2013  . Serum potassium elevated 08/09/2013  . Chronic kidney disease 08/09/2013    Past Surgical History:  Procedure  Laterality Date  . ABDOMINAL HYSTERECTOMY    . BILATERAL SALPINGOOPHORECTOMY    . CHOLECYSTECTOMY  1957  . COLONOSCOPY W/ BIOPSIES  May 27, 2011,01/10/14   tubular adenoma in the ascending colon and descending colon. Tubulovillous adenoma in the upper rectum 12 mm. Diverticulosis.  Marland Kitchen FRACTURE SURGERY Right    femur fracture  . HERNIA REPAIR    . INCONTINENCE SURGERY  2006  . NOSE SURGERY  2000  . UPPER GI ENDOSCOPY  May 30, 2004   large hiatal hernia, duodenal diverticulum.    Prior to Admission medications   Medication Sig Start Date End Date Taking? Authorizing Provider  acetaminophen (RA ACETAMINOPHEN) 650 MG CR tablet Take 650 mg by mouth every 8 (eight) hours as needed.     Historical Provider, MD  cholecalciferol (VITAMIN D) 400 units TABS tablet Take 400 Units by mouth daily.    Historical Provider, MD  Cyanocobalamin (B-12) 2500 MCG TABS Take 2,500 mcg by mouth daily.     Historical Provider, MD  ferrous sulfate 325 (65 FE) MG tablet Take 1 mg by mouth daily.     Historical Provider, MD  lisinopril (PRINIVIL,ZESTRIL) 10 MG tablet Take 10 mg by mouth daily.  01/15/15   Historical Provider, MD  metoprolol tartrate (LOPRESSOR) 25 MG tablet Take 12.5 mg by mouth 2 (two) times daily.    Historical Provider, MD  multivitamin-lutein (OCUVITE-LUTEIN) CAPS capsule Take 1 capsule by mouth daily.    Historical Provider, MD  polyethylene glycol (MIRALAX / GLYCOLAX) packet Take 17 g by mouth daily as needed.  Historical Provider, MD  potassium chloride SA (K-DUR,KLOR-CON) 20 MEQ tablet Take 20 mEq by mouth daily.    Historical Provider, MD  predniSONE (DELTASONE) 5 MG tablet Take 5 mg by mouth daily.    Historical Provider, MD  Probiotic Product (PROBIOTIC DAILY PO) Take 1 tablet by mouth daily.    Historical Provider, MD  traMADol (ULTRAM) 50 MG tablet Take 1 tablet (50 mg total) by mouth 2 (two) times daily. 07/08/16   Takyra Cantrall V Bacon Eddrick Dilone, PA-C  vitamin E 400 UNIT capsule Take 400  Units by mouth daily.    Historical Provider, MD    Allergies Amoxicillin; Nsaids; and Tolmetin  Family History  Problem Relation Age of Onset  . Arthritis Mother   . Heart disease Mother   . Heart disease Father   . Heart disease Sister     CAD  . Hypertension Sister   . Breast cancer Neg Hx     Social History Social History  Substance Use Topics  . Smoking status: Never Smoker  . Smokeless tobacco: Never Used  . Alcohol use No    Review of Systems  Constitutional: Negative for fever. Cardiovascular: Negative for chest pain. Respiratory: Negative for shortness of breath. Gastrointestinal: Negative for abdominal pain, vomiting and diarrhea. Musculoskeletal: Negative for back pain. Right wrist pain as above. Skin: Negative for rash. Skin tear as above Neurological: Negative for headaches, focal weakness or numbness. ____________________________________________  PHYSICAL EXAM:  VITAL SIGNS: ED Triage Vitals  Enc Vitals Group     BP 07/08/16 1534 (!) 147/87     Pulse Rate 07/08/16 1534 91     Resp 07/08/16 1534 16     Temp 07/08/16 1534 97.7 F (36.5 C)     Temp Source 07/08/16 1534 Oral     SpO2 07/08/16 1534 100 %     Weight 07/08/16 1535 98 lb (44.5 kg)     Height 07/08/16 1535 5\' 1"  (1.549 m)     Head Circumference --      Peak Flow --      Pain Score --      Pain Loc --      Pain Edu? --      Excl. in [place]? --     Constitutional: Alert and oriented. Well appearing and in no distress. Head: Normocephalic and atraumatic. Cardiovascular: Normal rate, regular rhythm. Normal distal pulses. Respiratory: Normal respiratory effort. No wheezes/rales/rhonchi. Gastrointestinal: Soft and nontender. No distention. Musculoskeletal: Right dorsal wrist with obvious STS and deformity. Local eechymosis noted. Normal composite fist. Nontender with normal range of motion in all extremities.  Neurologic:  Normal gross sensation. Normal speech and language. No gross focal  neurologic deficits are appreciated. Skin:  Skin is warm, dry and intact. No rash noted. Right ring finger with skin tear to the proximal phalanx.  ____________________________________________   RADIOLOGY  Right Wrist IMPRESSION: Mildly impacted and dorsally angulated distal metaphyseal fracture right radius does not appear to disrupt the articular surface. Ulnar styloid fracture noted.  Osteopenia.  I, Milferd Ansell, Dannielle Karvonen, personally viewed and evaluated these images (plain radiographs) as part of my medical decision making, as well as reviewing the written report by the radiologist. ____________________________________________  PROCEDURES [facility] wrist splint sling  LACERATION REPAIR Performed by: Melvenia Needles Authorized by: Melvenia Needles Consent: Verbal consent obtained. Risks and benefits: risks, benefits and alternatives were discussed Consent given by: patient Patient identity confirmed: provided demographic data Prepped and Draped in normal sterile  fashion Wound explored  Laceration Location: dorsal right ring finger  Laceration Length: 3 cm  No Foreign Bodies seen or palpated  Anesthesia: none  Irrigation method: syringe Amount of cleaning: standard  Skin closure: Dermabond wound adhesive  Patient tolerance: Patient tolerated the procedure well with no immediate complications. ____________________________________________  INITIAL IMPRESSION / ASSESSMENT AND PLAN / ED COURSE  Patient with initial fracture management following a mechanical fall resulting in a nondisplaced right radial fracture and ulnar styloid fracture. Patient is splinted appropriately placed in a sling for comfort. She is discharged with a small prescription of Ultram to take as needed for moderate pain. She will follow-up with Dr. Marry Guan for ongoing fracture management.  Clinical Course    ____________________________________________  FINAL CLINICAL  IMPRESSION(S) / ED DIAGNOSES  Final diagnoses:  Closed fracture of right wrist, initial encounter  Skin tear of hand without complication, right, initial encounter      Melvenia Needles, PA-C 07/08/16 1751    Earleen Newport, MD 07/08/16 2020

## 2016-07-08 NOTE — ED Triage Notes (Signed)
Patient presents to the ED post fall.  Patient is complaining of right wrist pain and has a laceration under her fourth finger on her right hand.  Patient states, "I was in a hurry to go to the bathroom and I tripped and tried to catch myself with my right hand.  Patient denies hitting her head or passing out.  Patient is in no obvious distress at this time.

## 2016-07-08 NOTE — ED Notes (Signed)
See triage note   States she fell   Having pain to right wrist   Laceration  Skin tear to right 5 th finger

## 2016-08-18 ENCOUNTER — Encounter: Payer: Self-pay | Admitting: Family Medicine

## 2016-08-18 ENCOUNTER — Ambulatory Visit (INDEPENDENT_AMBULATORY_CARE_PROVIDER_SITE_OTHER): Payer: Medicare Other | Admitting: Family Medicine

## 2016-08-18 VITALS — BP 142/84 | HR 59 | Temp 98.5°F | Ht 61.0 in | Wt 117.4 lb

## 2016-08-18 DIAGNOSIS — S4991XA Unspecified injury of right shoulder and upper arm, initial encounter: Secondary | ICD-10-CM | POA: Diagnosis not present

## 2016-08-18 DIAGNOSIS — E871 Hypo-osmolality and hyponatremia: Secondary | ICD-10-CM | POA: Diagnosis not present

## 2016-08-18 DIAGNOSIS — R269 Unspecified abnormalities of gait and mobility: Secondary | ICD-10-CM

## 2016-08-18 DIAGNOSIS — I10 Essential (primary) hypertension: Secondary | ICD-10-CM

## 2016-08-18 DIAGNOSIS — N189 Chronic kidney disease, unspecified: Secondary | ICD-10-CM | POA: Diagnosis not present

## 2016-08-18 LAB — BASIC METABOLIC PANEL
BUN: 14 mg/dL (ref 6–23)
CALCIUM: 9 mg/dL (ref 8.4–10.5)
CO2: 29 meq/L (ref 19–32)
Chloride: 107 mEq/L (ref 96–112)
Creatinine, Ser: 0.91 mg/dL (ref 0.40–1.20)
GFR: 62.39 mL/min (ref >60.00)
GLUCOSE: 87 mg/dL (ref 70–99)
Potassium: 3.7 mEq/L (ref 3.5–5.1)
Sodium: 143 mEq/L (ref 135–145)

## 2016-08-18 MED ORDER — METOPROLOL SUCCINATE ER 25 MG PO TB24: 25.0000 mg | ORAL_TABLET | Freq: Every day | ORAL | 3 refills | Status: AC

## 2016-08-18 NOTE — Assessment & Plan Note (Signed)
Patient with right arm injury after fall. Appears to be well healing and is followed by orthopedics for this. Discussed continuing to monitor and following up with orthopedics.

## 2016-08-18 NOTE — Progress Notes (Signed)
Suzanne Rumps, MD Phone: [phone]  [name] is a 83 y.o. female who presents today for follow-up.  Hypertension: Not checking at home. Taking metoprolol and lisinopril. No chest pain, shortness of breath, or edema.  CKD: Related to ANCA vasculitis. Sees nephrology. Taking lisinopril. No longer on potassium. No anti-inflammatory use.  Patient notes right wrist fracture after falling while tripping over a carpet. Has been followed by orthopedics for this and was in a cast and now is in a brace. She still somewhat stiff and sore that was improving.  Patient does note some chronic imbalance with walking if she turns too quickly. Does have a history of hyponatremia and imbalance related to that. Most recent sodiums have been normal. No nausea or vomiting. Has done physical therapy in the past though none recently.  Patient's daughter also wants to know if her metoprolol dose can be switched to once daily at night.  PMH: nonsmoker.   ROS see history of present illness  Objective  Physical Exam Vitals:   08/18/16 0847 08/18/16 0915  BP: (!) 150/90 (!) 142/84  Pulse: (!) 59   Temp: 98.5 F (36.9 C)     BP Readings from Last 3 Encounters:  08/18/16 (!) 142/84  07/08/16 (!) 151/86  05/16/16 136/84   Wt Readings from Last 3 Encounters:  08/18/16 117 lb 6.4 oz (53.3 kg)  07/08/16 98 lb (44.5 kg)  05/16/16 118 lb (53.5 kg)    Physical Exam  Constitutional: No distress.  Cardiovascular: Normal rate, regular rhythm and normal heart sounds.   Pulmonary/Chest: Effort normal and breath sounds normal.  Musculoskeletal: She exhibits no edema.  Right wrist mildly swollen compared to left, mild tenderness over the dorsal aspect of the distal radius and ulna on the right, no tenderness on the left  Neurological: She is alert.  Slow steady gait with cane, does appear mildly unsteady though is able to correct this with cane use, able to stand from chair on her own with no  assistance  Skin: Skin is warm and dry. She is not diaphoretic.     Assessment/Plan: Please see individual problem list.  Chronic kidney disease Check BMP today. Continue to follow with nephrology.  Hyponatremia Recheck sodium today. Doubt her imbalance on walking and turns is related to this as it has been more chronic.  Arm injury, right, initial encounter Patient with right arm injury after fall. Appears to be well healing and is followed by orthopedics for this. Discussed continuing to monitor and following up with orthopedics.  Gait difficulty Patient with slight imbalance on gait particularly if turning. Discussed fall precautions for home. Discussed continuing to use her cane. We'll have home health physical therapy evaluate and treat the patient.  Essential hypertension Slightly above goal though they report patient has not taken her medicines today. They will make sure she takes them today. We'll switch her metoprolol to extended release version 25 mg nightly. Continue to monitor blood pressure.   Orders Placed This Encounter  Procedures  . Basic Metabolic Panel (BMET)  . Ambulatory referral to Home Health    Referral Priority:   Routine    Referral Type:   Home Health Care    Referral Reason:   Specialty Services Required    Requested Specialty:   [place]    Number of Visits Requested:   1    Meds ordered this encounter  Medications  . ALOE PO    Sig: Take by mouth.  . Acetylcysteine (  NAC PO)    Sig: Take by mouth.  . metoprolol succinate (TOPROL-XL) 25 MG 24 hr tablet    Sig: Take 1 tablet (25 mg total) by mouth daily.    Dispense:  90 tablet    Refill:  3   Discussed the above supplements with the patient and her daughter. Advised that they would likely be okay given known ingredients though I did warn that supplements are not regulated and could have other ingredients in them.   Suzanne Rumps, MD [place]

## 2016-08-18 NOTE — Assessment & Plan Note (Signed)
Check BMP today. Continue to follow with nephrology.

## 2016-08-18 NOTE — Assessment & Plan Note (Signed)
Recheck sodium today. Doubt her imbalance on walking and turns is related to this as it has been more chronic.

## 2016-08-18 NOTE — Assessment & Plan Note (Signed)
Slightly above goal though they report patient has not taken her medicines today. They will make sure she takes them today. We'll switch her metoprolol to extended release version 25 mg nightly. Continue to monitor blood pressure.

## 2016-08-18 NOTE — Assessment & Plan Note (Signed)
Patient with slight imbalance on gait particularly if turning. Discussed fall precautions for home. Discussed continuing to use her cane. We'll have home health physical therapy evaluate and treat the patient.

## 2016-08-18 NOTE — Patient Instructions (Signed)
Nice to see you. We'll check some lab work and contact with the results. We are going to switch you to the extended release metoprolol. You may take this at night. We will get you set up with home health physical therapy as well.

## 2016-08-18 NOTE — Progress Notes (Signed)
Pre visit review using our clinic review tool, if applicable. No additional management support is needed unless otherwise documented below in the visit note. 

## 2016-08-25 ENCOUNTER — Telehealth: Payer: Self-pay | Admitting: *Deleted

## 2016-08-25 NOTE — Telephone Encounter (Signed)
Notified of approved orders

## 2016-08-25 NOTE — Telephone Encounter (Signed)
Is this ok?

## 2016-08-25 NOTE — Telephone Encounter (Addendum)
[name] from [facility] requested verbal orders for home visits 1 time a week for 1 week. 2 times a week for 3 weeks  Contact [name] [phone]

## 2016-08-25 NOTE — Telephone Encounter (Signed)
It is okay to give verbal orders for this. 

## 2016-09-03 ENCOUNTER — Telehealth: Payer: Self-pay | Admitting: Family Medicine

## 2016-09-03 ENCOUNTER — Telehealth: Payer: Self-pay | Admitting: *Deleted

## 2016-09-03 ENCOUNTER — Telehealth: Payer: Self-pay

## 2016-09-04 NOTE — Telephone Encounter (Signed)
Death Certificate in [place] folder to be sign

## 2016-09-04 NOTE — Telephone Encounter (Signed)
Rita from [facility] has requested to know if Dr. Caryl Bis could sign the death certificate. Please contact Velva Harman [phone]

## 2016-09-04 NOTE — Telephone Encounter (Signed)
Completed and placed on [name]'s desk.

## 2016-09-04 NOTE — Telephone Encounter (Signed)
Notified Rita per Dr.Sonnenberg that we can sign this, they will drop off the form

## 2016-09-04 NOTE — Telephone Encounter (Signed)
Pt's death certificate was dropped off to be singed by Dr. Caryl Bis. Garlon Hatchet came up front and took it out of color folder for Dr. Caryl Bis to sign.
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
## 2016-09-04 DEATH — deceased

## 2016-11-18 ENCOUNTER — Ambulatory Visit: Payer: BLUE CROSS/BLUE SHIELD | Admitting: Family Medicine

## 2017-01-20 ENCOUNTER — Ambulatory Visit: Payer: BLUE CROSS/BLUE SHIELD
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
